# Patient Record
Sex: Female | Born: 1950 | Race: White | Hispanic: No | Marital: Married | Smoking: Never smoker
Health system: Southern US, Community
[De-identification: ages and names within clinical notes are randomized; demographics above are authoritative.]

## PROBLEM LIST (undated history)

## (undated) DIAGNOSIS — K7689 Other specified diseases of liver: Secondary | ICD-10-CM

## (undated) DIAGNOSIS — C801 Malignant (primary) neoplasm, unspecified: Secondary | ICD-10-CM

## (undated) DIAGNOSIS — R7309 Other abnormal glucose: Secondary | ICD-10-CM

## (undated) DIAGNOSIS — E039 Hypothyroidism, unspecified: Secondary | ICD-10-CM

## (undated) DIAGNOSIS — E669 Obesity, unspecified: Secondary | ICD-10-CM

## (undated) DIAGNOSIS — G4730 Sleep apnea, unspecified: Secondary | ICD-10-CM

## (undated) DIAGNOSIS — I029 Rheumatic chorea without heart involvement: Secondary | ICD-10-CM

## (undated) DIAGNOSIS — E785 Hyperlipidemia, unspecified: Secondary | ICD-10-CM

## (undated) DIAGNOSIS — T783XXA Angioneurotic edema, initial encounter: Secondary | ICD-10-CM

## (undated) DIAGNOSIS — Z8711 Personal history of peptic ulcer disease: Secondary | ICD-10-CM

## (undated) DIAGNOSIS — T7840XA Allergy, unspecified, initial encounter: Secondary | ICD-10-CM

## (undated) DIAGNOSIS — G43009 Migraine without aura, not intractable, without status migrainosus: Secondary | ICD-10-CM

## (undated) DIAGNOSIS — I495 Sick sinus syndrome: Secondary | ICD-10-CM

## (undated) DIAGNOSIS — E119 Type 2 diabetes mellitus without complications: Secondary | ICD-10-CM

## (undated) DIAGNOSIS — Z923 Personal history of irradiation: Secondary | ICD-10-CM

## (undated) DIAGNOSIS — Z973 Presence of spectacles and contact lenses: Secondary | ICD-10-CM

## (undated) DIAGNOSIS — M1990 Unspecified osteoarthritis, unspecified site: Secondary | ICD-10-CM

## (undated) DIAGNOSIS — I10 Essential (primary) hypertension: Secondary | ICD-10-CM

## (undated) DIAGNOSIS — M549 Dorsalgia, unspecified: Secondary | ICD-10-CM

## (undated) DIAGNOSIS — Z972 Presence of dental prosthetic device (complete) (partial): Secondary | ICD-10-CM

## (undated) DIAGNOSIS — K08109 Complete loss of teeth, unspecified cause, unspecified class: Secondary | ICD-10-CM

## (undated) HISTORY — DX: Type 2 diabetes mellitus without complications: E11.9

## (undated) HISTORY — DX: Malignant (primary) neoplasm, unspecified: C80.1

## (undated) HISTORY — DX: Sick sinus syndrome: I49.5

## (undated) HISTORY — DX: Hypothyroidism, unspecified: E03.9

## (undated) HISTORY — DX: Essential (primary) hypertension: I10

## (undated) HISTORY — DX: Obesity, unspecified: E66.9

## (undated) HISTORY — DX: Personal history of peptic ulcer disease: Z87.11

## (undated) HISTORY — DX: Rheumatic chorea without heart involvement: I02.9

## (undated) HISTORY — DX: Other specified diseases of liver: K76.89

## (undated) HISTORY — DX: Dorsalgia, unspecified: M54.9

## (undated) HISTORY — DX: Allergy, unspecified, initial encounter: T78.40XA

## (undated) HISTORY — PX: BREAST BIOPSY: SHX20

## (undated) HISTORY — DX: Migraine without aura, not intractable, without status migrainosus: G43.009

## (undated) HISTORY — DX: Sleep apnea, unspecified: G47.30

## (undated) HISTORY — DX: Unspecified osteoarthritis, unspecified site: M19.90

## (undated) HISTORY — DX: Angioneurotic edema, initial encounter: T78.3XXA

## (undated) HISTORY — DX: Hyperlipidemia, unspecified: E78.5

## (undated) HISTORY — PX: OTHER SURGICAL HISTORY: SHX169

## (undated) HISTORY — DX: Other abnormal glucose: R73.09

## (undated) HISTORY — PX: LAPAROSCOPY ABDOMEN DIAGNOSTIC: PRO50

## (undated) HISTORY — PX: TONSILLECTOMY: SUR1361

---

## 1969-05-05 HISTORY — PX: TUBAL LIGATION: SHX77

## 1986-05-05 HISTORY — PX: ABDOMINAL HYSTERECTOMY: SHX81

## 1999-06-04 ENCOUNTER — Encounter: Payer: Self-pay | Admitting: Internal Medicine

## 1999-06-04 ENCOUNTER — Encounter: Admission: RE | Admit: 1999-06-04 | Discharge: 1999-06-04 | Payer: Self-pay | Admitting: Internal Medicine

## 2000-06-03 ENCOUNTER — Encounter: Admission: RE | Admit: 2000-06-03 | Discharge: 2000-06-03 | Payer: Self-pay | Admitting: Internal Medicine

## 2000-06-03 ENCOUNTER — Encounter: Payer: Self-pay | Admitting: Internal Medicine

## 2001-08-06 ENCOUNTER — Encounter: Admission: RE | Admit: 2001-08-06 | Discharge: 2001-08-06 | Payer: Self-pay | Admitting: Family Medicine

## 2001-08-06 ENCOUNTER — Encounter: Payer: Self-pay | Admitting: Family Medicine

## 2002-05-24 ENCOUNTER — Encounter: Admission: RE | Admit: 2002-05-24 | Discharge: 2002-05-24 | Payer: Self-pay | Admitting: Internal Medicine

## 2002-05-24 ENCOUNTER — Encounter: Payer: Self-pay | Admitting: Internal Medicine

## 2003-06-05 ENCOUNTER — Encounter: Admission: RE | Admit: 2003-06-05 | Discharge: 2003-06-05 | Payer: Self-pay | Admitting: Obstetrics and Gynecology

## 2004-04-05 ENCOUNTER — Ambulatory Visit: Payer: Self-pay | Admitting: Internal Medicine

## 2004-04-05 ENCOUNTER — Observation Stay (HOSPITAL_COMMUNITY): Admission: EM | Admit: 2004-04-05 | Discharge: 2004-04-06 | Payer: Self-pay | Admitting: Emergency Medicine

## 2004-04-05 ENCOUNTER — Ambulatory Visit: Payer: Self-pay | Admitting: Cardiology

## 2004-04-10 ENCOUNTER — Ambulatory Visit: Payer: Self-pay

## 2004-04-10 ENCOUNTER — Ambulatory Visit: Payer: Self-pay | Admitting: Cardiology

## 2004-07-09 ENCOUNTER — Encounter: Admission: RE | Admit: 2004-07-09 | Discharge: 2004-07-09 | Payer: Self-pay | Admitting: Obstetrics and Gynecology

## 2004-09-19 ENCOUNTER — Ambulatory Visit: Payer: Self-pay | Admitting: Internal Medicine

## 2004-12-16 ENCOUNTER — Ambulatory Visit: Payer: Self-pay | Admitting: Internal Medicine

## 2005-01-24 ENCOUNTER — Other Ambulatory Visit: Admission: RE | Admit: 2005-01-24 | Discharge: 2005-01-24 | Payer: Self-pay | Admitting: Obstetrics and Gynecology

## 2005-08-13 ENCOUNTER — Encounter: Admission: RE | Admit: 2005-08-13 | Discharge: 2005-08-13 | Payer: Self-pay | Admitting: Internal Medicine

## 2005-12-08 ENCOUNTER — Ambulatory Visit: Payer: Self-pay | Admitting: Internal Medicine

## 2006-03-06 ENCOUNTER — Ambulatory Visit: Payer: Self-pay | Admitting: Internal Medicine

## 2006-03-06 LAB — CONVERTED CEMR LAB
AST: 21 units/L (ref 0–37)
Albumin: 3.7 g/dL (ref 3.5–5.2)
Alkaline Phosphatase: 60 units/L (ref 39–117)
BUN: 13 mg/dL (ref 6–23)
Basophils Absolute: 0.1 10*3/uL (ref 0.0–0.1)
Chloride: 105 meq/L (ref 96–112)
Chol/HDL Ratio, serum: 6.3
Eosinophil percent: 4.2 % (ref 0.0–5.0)
Glucose, Bld: 92 mg/dL (ref 70–99)
HDL: 30.4 mg/dL — ABNORMAL LOW (ref >39.0)
Hemoglobin: 14.5 g/dL (ref 12.0–15.0)
Leukocytes, UA: NEGATIVE
Neutro Abs: 4.9 10*3/uL (ref 1.4–7.7)
RBC: 4.86 M/uL (ref 3.87–5.11)
RDW: 11.9 % (ref 11.5–14.6)
TSH: 0.1 microintl units/mL — ABNORMAL LOW (ref 0.35–5.50)
Total Bilirubin: 0.7 mg/dL (ref 0.3–1.2)
VLDL: 38 mg/dL (ref 0–40)
pH: 5 (ref 5.0–8.0)

## 2006-03-10 ENCOUNTER — Ambulatory Visit: Payer: Self-pay | Admitting: Internal Medicine

## 2006-05-13 ENCOUNTER — Encounter: Admission: RE | Admit: 2006-05-13 | Discharge: 2006-05-13 | Payer: Self-pay | Admitting: Internal Medicine

## 2006-06-08 ENCOUNTER — Ambulatory Visit: Payer: Self-pay | Admitting: Internal Medicine

## 2006-07-13 ENCOUNTER — Ambulatory Visit: Payer: Self-pay | Admitting: Internal Medicine

## 2006-07-13 LAB — HM COLONOSCOPY

## 2006-08-19 ENCOUNTER — Ambulatory Visit: Payer: Self-pay | Admitting: Internal Medicine

## 2006-11-23 ENCOUNTER — Ambulatory Visit: Payer: Self-pay | Admitting: Internal Medicine

## 2007-01-27 ENCOUNTER — Ambulatory Visit: Payer: Self-pay | Admitting: Internal Medicine

## 2007-01-27 ENCOUNTER — Encounter: Payer: Self-pay | Admitting: Internal Medicine

## 2007-01-27 DIAGNOSIS — Z8669 Personal history of other diseases of the nervous system and sense organs: Secondary | ICD-10-CM

## 2007-01-28 DIAGNOSIS — E039 Hypothyroidism, unspecified: Secondary | ICD-10-CM

## 2007-01-28 DIAGNOSIS — T783XXA Angioneurotic edema, initial encounter: Secondary | ICD-10-CM | POA: Insufficient documentation

## 2007-01-28 DIAGNOSIS — R7309 Other abnormal glucose: Secondary | ICD-10-CM

## 2007-01-28 DIAGNOSIS — E781 Pure hyperglyceridemia: Secondary | ICD-10-CM

## 2007-01-28 DIAGNOSIS — G43009 Migraine without aura, not intractable, without status migrainosus: Secondary | ICD-10-CM | POA: Insufficient documentation

## 2007-01-28 DIAGNOSIS — K7689 Other specified diseases of liver: Secondary | ICD-10-CM | POA: Insufficient documentation

## 2007-01-28 DIAGNOSIS — I10 Essential (primary) hypertension: Secondary | ICD-10-CM

## 2007-01-28 DIAGNOSIS — E663 Overweight: Secondary | ICD-10-CM | POA: Insufficient documentation

## 2007-01-28 DIAGNOSIS — K760 Fatty (change of) liver, not elsewhere classified: Secondary | ICD-10-CM | POA: Insufficient documentation

## 2007-05-10 ENCOUNTER — Encounter: Payer: Self-pay | Admitting: *Deleted

## 2007-05-10 DIAGNOSIS — M549 Dorsalgia, unspecified: Secondary | ICD-10-CM | POA: Insufficient documentation

## 2007-05-10 DIAGNOSIS — Z9089 Acquired absence of other organs: Secondary | ICD-10-CM

## 2007-05-10 DIAGNOSIS — Z9079 Acquired absence of other genital organ(s): Secondary | ICD-10-CM | POA: Insufficient documentation

## 2007-05-10 DIAGNOSIS — Z8711 Personal history of peptic ulcer disease: Secondary | ICD-10-CM

## 2007-05-10 DIAGNOSIS — I029 Rheumatic chorea without heart involvement: Secondary | ICD-10-CM | POA: Insufficient documentation

## 2007-07-07 ENCOUNTER — Encounter: Admission: RE | Admit: 2007-07-07 | Discharge: 2007-07-07 | Payer: Self-pay | Admitting: Allergy and Immunology

## 2007-07-28 ENCOUNTER — Ambulatory Visit: Payer: Self-pay | Admitting: Hematology & Oncology

## 2007-08-18 ENCOUNTER — Encounter: Payer: Self-pay | Admitting: Internal Medicine

## 2007-08-18 LAB — CBC & DIFF AND RETIC
BASO%: 0.5 % (ref 0.0–2.0)
Eosinophils Absolute: 0.2 10*3/uL (ref 0.0–0.5)
HCT: 43.3 % (ref 34.8–46.6)
IRF: 0.36 — ABNORMAL HIGH (ref 0.130–0.330)
LYMPH%: 27.9 % (ref 14.0–48.0)
MCHC: 35.4 g/dL (ref 32.0–36.0)
MONO#: 0.5 10*3/uL (ref 0.1–0.9)
NEUT#: 5.9 10*3/uL (ref 1.5–6.5)
Platelets: 388 10*3/uL (ref 145–400)
RBC: 4.92 10*6/uL (ref 3.70–5.32)
Retic %: 1.9 % (ref 0.4–2.3)
WBC: 9.1 10*3/uL (ref 3.9–10.0)
lymph#: 2.5 10*3/uL (ref 0.9–3.3)

## 2007-08-18 LAB — CHCC SMEAR

## 2007-08-24 LAB — COMPREHENSIVE METABOLIC PANEL
ALT: 17 U/L (ref 0–35)
AST: 15 U/L (ref 0–37)
Albumin: 4 g/dL (ref 3.5–5.2)
Alkaline Phosphatase: 60 U/L (ref 39–117)
Potassium: 4.1 mEq/L (ref 3.5–5.3)
Sodium: 138 mEq/L (ref 135–145)
Total Bilirubin: 0.6 mg/dL (ref 0.3–1.2)
Total Protein: 7.2 g/dL (ref 6.0–8.3)

## 2007-08-24 LAB — HEMOGLOBINOPATHY EVALUATION
Hemoglobin Other: 0 % (ref 0.0–0.0)
Hgb A2 Quant: 2.4 % (ref 2.2–3.2)
Hgb A: 97.6 % (ref 96.8–97.8)
Hgb F Quant: 0 % (ref 0.0–2.0)
Hgb S Quant: 0 % (ref 0.0–0.0)

## 2007-08-24 LAB — VITAMIN B12: Vitamin B-12: 414 pg/mL (ref 211–911)

## 2007-10-18 ENCOUNTER — Ambulatory Visit: Payer: Self-pay | Admitting: Hematology & Oncology

## 2007-10-20 ENCOUNTER — Encounter: Payer: Self-pay | Admitting: Internal Medicine

## 2007-10-20 LAB — CBC WITH DIFFERENTIAL/PLATELET
BASO%: 0.5 % (ref 0.0–2.0)
Basophils Absolute: 0 10*3/uL (ref 0.0–0.1)
EOS%: 2.6 % (ref 0.0–7.0)
HCT: 42.9 % (ref 34.8–46.6)
HGB: 14.9 g/dL (ref 11.6–15.9)
MONO#: 0.6 10*3/uL (ref 0.1–0.9)
NEUT%: 60.2 % (ref 39.6–76.8)
RDW: 13 % (ref 11.3–14.5)
WBC: 8.9 10*3/uL (ref 3.9–10.0)
lymph#: 2.6 10*3/uL (ref 0.9–3.3)

## 2008-03-10 ENCOUNTER — Encounter: Admission: RE | Admit: 2008-03-10 | Discharge: 2008-03-10 | Payer: Self-pay | Admitting: Obstetrics and Gynecology

## 2008-06-14 ENCOUNTER — Ambulatory Visit: Payer: Self-pay | Admitting: Internal Medicine

## 2008-09-15 ENCOUNTER — Encounter: Payer: Self-pay | Admitting: Internal Medicine

## 2008-12-01 ENCOUNTER — Telehealth: Payer: Self-pay | Admitting: Internal Medicine

## 2009-04-12 ENCOUNTER — Encounter: Admission: RE | Admit: 2009-04-12 | Discharge: 2009-04-12 | Payer: Self-pay | Admitting: Obstetrics and Gynecology

## 2009-04-12 ENCOUNTER — Ambulatory Visit: Payer: Self-pay | Admitting: Internal Medicine

## 2009-04-12 DIAGNOSIS — R252 Cramp and spasm: Secondary | ICD-10-CM

## 2009-04-12 LAB — CONVERTED CEMR LAB
ALT: 21 units/L (ref 0–35)
AST: 23 units/L (ref 0–37)
Albumin: 3.9 g/dL (ref 3.5–5.2)
Alkaline Phosphatase: 63 units/L (ref 39–117)
Basophils Absolute: 0.1 10*3/uL (ref 0.0–0.1)
Bilirubin, Direct: 0.1 mg/dL (ref 0.0–0.3)
Eosinophils Absolute: 0.3 10*3/uL (ref 0.0–0.7)
Eosinophils Relative: 3.5 % (ref 0.0–5.0)
GFR calc non Af Amer: 77.71 mL/min (ref >60)
HCT: 43 % (ref 36.0–46.0)
Hemoglobin: 14.8 g/dL (ref 12.0–15.0)
Lymphocytes Relative: 29.8 % (ref 12.0–46.0)
Lymphs Abs: 2.6 10*3/uL (ref 0.7–4.0)
MCHC: 34.4 g/dL (ref 30.0–36.0)
MCV: 92.2 fL (ref 78.0–100.0)
Neutro Abs: 5.2 10*3/uL (ref 1.4–7.7)
Neutrophils Relative %: 61.1 % (ref 43.0–77.0)
Platelets: 284 10*3/uL (ref 150.0–400.0)
RBC: 4.67 M/uL (ref 3.87–5.11)
RDW: 11.9 % (ref 11.5–14.6)
WBC: 8.6 10*3/uL (ref 4.5–10.5)

## 2009-04-18 ENCOUNTER — Encounter: Admission: RE | Admit: 2009-04-18 | Discharge: 2009-04-18 | Payer: Self-pay | Admitting: Obstetrics and Gynecology

## 2009-04-24 ENCOUNTER — Encounter: Admission: RE | Admit: 2009-04-24 | Discharge: 2009-04-24 | Payer: Self-pay | Admitting: Obstetrics and Gynecology

## 2009-05-01 ENCOUNTER — Encounter: Admission: RE | Admit: 2009-05-01 | Discharge: 2009-05-01 | Payer: Self-pay | Admitting: Obstetrics and Gynecology

## 2009-05-05 HISTORY — PX: BREAST LUMPECTOMY: SHX2

## 2009-05-08 ENCOUNTER — Encounter: Admission: RE | Admit: 2009-05-08 | Discharge: 2009-05-08 | Payer: Self-pay | Admitting: Obstetrics and Gynecology

## 2009-05-14 ENCOUNTER — Encounter: Payer: Self-pay | Admitting: Internal Medicine

## 2009-05-22 ENCOUNTER — Encounter: Admission: RE | Admit: 2009-05-22 | Discharge: 2009-05-22 | Payer: Self-pay | Admitting: Obstetrics and Gynecology

## 2009-05-31 ENCOUNTER — Encounter: Admission: RE | Admit: 2009-05-31 | Discharge: 2009-05-31 | Payer: Self-pay | Admitting: Surgery

## 2009-06-05 ENCOUNTER — Ambulatory Visit (HOSPITAL_BASED_OUTPATIENT_CLINIC_OR_DEPARTMENT_OTHER): Admission: RE | Admit: 2009-06-05 | Discharge: 2009-06-05 | Payer: Self-pay | Admitting: Surgery

## 2009-06-05 ENCOUNTER — Encounter: Admission: RE | Admit: 2009-06-05 | Discharge: 2009-06-05 | Payer: Self-pay | Admitting: Surgery

## 2009-06-18 ENCOUNTER — Ambulatory Visit: Payer: Self-pay | Admitting: Hematology & Oncology

## 2009-06-20 ENCOUNTER — Ambulatory Visit: Payer: Self-pay | Admitting: Oncology

## 2009-06-20 ENCOUNTER — Encounter: Payer: Self-pay | Admitting: Internal Medicine

## 2009-06-20 LAB — CBC WITH DIFFERENTIAL/PLATELET
BASO%: 0.2 % (ref 0.0–2.0)
EOS%: 2.2 % (ref 0.0–7.0)
MCH: 31.2 pg (ref 25.1–34.0)
MCHC: 34.5 g/dL (ref 31.5–36.0)
MONO#: 0.7 10*3/uL (ref 0.1–0.9)
NEUT%: 60.1 % (ref 38.4–76.8)
RBC: 5 10*6/uL (ref 3.70–5.45)
RDW: 12.6 % (ref 11.2–14.5)
WBC: 9.3 10*3/uL (ref 3.9–10.3)
lymph#: 2.8 10*3/uL (ref 0.9–3.3)

## 2009-06-20 LAB — COMPREHENSIVE METABOLIC PANEL
ALT: 19 U/L (ref 0–35)
AST: 16 U/L (ref 0–37)
CO2: 23 mEq/L (ref 19–32)
Calcium: 9.6 mg/dL (ref 8.4–10.5)
Chloride: 103 mEq/L (ref 96–112)
Creatinine, Ser: 0.84 mg/dL (ref 0.40–1.20)
Sodium: 138 mEq/L (ref 135–145)
Total Protein: 7.5 g/dL (ref 6.0–8.3)

## 2009-06-20 LAB — CANCER ANTIGEN 27.29: CA 27.29: 33 U/mL (ref 0–39)

## 2009-06-26 ENCOUNTER — Ambulatory Visit: Admission: RE | Admit: 2009-06-26 | Discharge: 2009-08-27 | Payer: Self-pay | Admitting: Radiation Oncology

## 2009-06-27 ENCOUNTER — Encounter: Payer: Self-pay | Admitting: Internal Medicine

## 2009-07-04 ENCOUNTER — Ambulatory Visit: Payer: Self-pay | Admitting: Internal Medicine

## 2009-07-04 ENCOUNTER — Encounter: Payer: Self-pay | Admitting: Internal Medicine

## 2009-08-10 ENCOUNTER — Ambulatory Visit: Payer: Self-pay | Admitting: Oncology

## 2009-08-14 ENCOUNTER — Encounter: Payer: Self-pay | Admitting: Internal Medicine

## 2009-08-14 LAB — CBC WITH DIFFERENTIAL/PLATELET
BASO%: 0.4 % (ref 0.0–2.0)
EOS%: 3.9 % (ref 0.0–7.0)
Eosinophils Absolute: 0.3 10*3/uL (ref 0.0–0.5)
LYMPH%: 24 % (ref 14.0–49.7)
MCH: 30.7 pg (ref 25.1–34.0)
MCHC: 33.7 g/dL (ref 31.5–36.0)
MCV: 91.2 fL (ref 79.5–101.0)
MONO%: 8.6 % (ref 0.0–14.0)
NEUT#: 4.6 10*3/uL (ref 1.5–6.5)
Platelets: 261 10*3/uL (ref 145–400)
RBC: 5.01 10*6/uL (ref 3.70–5.45)
RDW: 13.1 % (ref 11.2–14.5)
nRBC: 0 % (ref 0–0)

## 2009-08-19 ENCOUNTER — Encounter: Payer: Self-pay | Admitting: Internal Medicine

## 2009-09-19 ENCOUNTER — Ambulatory Visit: Payer: Self-pay | Admitting: Oncology

## 2009-09-19 LAB — CBC WITH DIFFERENTIAL/PLATELET
Basophils Absolute: 0 10*3/uL (ref 0.0–0.1)
EOS%: 2.8 % (ref 0.0–7.0)
Eosinophils Absolute: 0.2 10*3/uL (ref 0.0–0.5)
HGB: 15 g/dL (ref 11.6–15.9)
NEUT#: 4.1 10*3/uL (ref 1.5–6.5)
RBC: 4.74 10*6/uL (ref 3.70–5.45)
RDW: 12.4 % (ref 11.2–14.5)
lymph#: 1.8 10*3/uL (ref 0.9–3.3)

## 2009-09-20 LAB — COMPREHENSIVE METABOLIC PANEL
AST: 18 U/L (ref 0–37)
Albumin: 4.1 g/dL (ref 3.5–5.2)
BUN: 11 mg/dL (ref 6–23)
Calcium: 9.3 mg/dL (ref 8.4–10.5)
Chloride: 105 mEq/L (ref 96–112)
Glucose, Bld: 208 mg/dL — ABNORMAL HIGH (ref 70–99)
Potassium: 4.1 mEq/L (ref 3.5–5.3)
Sodium: 138 mEq/L (ref 135–145)
Total Protein: 7.1 g/dL (ref 6.0–8.3)

## 2009-09-27 ENCOUNTER — Encounter: Payer: Self-pay | Admitting: Internal Medicine

## 2009-10-02 ENCOUNTER — Telehealth: Payer: Self-pay | Admitting: Internal Medicine

## 2009-10-02 ENCOUNTER — Ambulatory Visit: Payer: Self-pay | Admitting: Internal Medicine

## 2009-10-02 LAB — CONVERTED CEMR LAB: Hgb A1c MFr Bld: 6.2 % (ref 4.6–6.5)

## 2009-12-19 ENCOUNTER — Encounter: Payer: Self-pay | Admitting: Internal Medicine

## 2010-01-17 ENCOUNTER — Ambulatory Visit: Payer: Self-pay | Admitting: Oncology

## 2010-01-22 LAB — CBC WITH DIFFERENTIAL/PLATELET
BASO%: 0.5 % (ref 0.0–2.0)
EOS%: 1.8 % (ref 0.0–7.0)
Eosinophils Absolute: 0.1 10*3/uL (ref 0.0–0.5)
HGB: 14.6 g/dL (ref 11.6–15.9)
LYMPH%: 26.4 % (ref 14.0–49.7)
MCH: 31 pg (ref 25.1–34.0)
MCHC: 34.6 g/dL (ref 31.5–36.0)
MCV: 89.5 fL (ref 79.5–101.0)
NEUT#: 5.3 10*3/uL (ref 1.5–6.5)
Platelets: 268 10*3/uL (ref 145–400)
RBC: 4.72 10*6/uL (ref 3.70–5.45)
WBC: 8.4 10*3/uL (ref 3.9–10.3)

## 2010-01-22 LAB — COMPREHENSIVE METABOLIC PANEL
Alkaline Phosphatase: 58 U/L (ref 39–117)
BUN: 15 mg/dL (ref 6–23)
CO2: 28 mEq/L (ref 19–32)
Chloride: 105 mEq/L (ref 96–112)
Potassium: 4.7 mEq/L (ref 3.5–5.3)
Sodium: 141 mEq/L (ref 135–145)
Total Bilirubin: 0.4 mg/dL (ref 0.3–1.2)
Total Protein: 7.3 g/dL (ref 6.0–8.3)

## 2010-01-22 LAB — VITAMIN D 25 HYDROXY (VIT D DEFICIENCY, FRACTURES): Vit D, 25-Hydroxy: 43 ng/mL (ref 30–89)

## 2010-01-28 ENCOUNTER — Encounter: Payer: Self-pay | Admitting: Internal Medicine

## 2010-04-12 ENCOUNTER — Ambulatory Visit: Payer: Self-pay | Admitting: Oncology

## 2010-04-15 ENCOUNTER — Encounter
Admission: RE | Admit: 2010-04-15 | Discharge: 2010-04-15 | Payer: Self-pay | Source: Home / Self Care | Attending: Surgery | Admitting: Surgery

## 2010-04-15 LAB — HM MAMMOGRAPHY

## 2010-04-30 ENCOUNTER — Encounter: Payer: Self-pay | Admitting: Internal Medicine

## 2010-05-26 ENCOUNTER — Encounter: Payer: Self-pay | Admitting: Obstetrics and Gynecology

## 2010-06-04 NOTE — Progress Notes (Signed)
  Phone Note Call from Patient   Summary of Call: Per pt, Dr Darnelle Catalan was concerned about elevated cbg, 200, from last labs. Dr Darnelle Catalan advised pt to get labs from Dr Debby Bud office. She wanted to know if we had any information regarding this. left mess to call office back  Initial call taken by: Lamar Sprinkles, CMA,  Oct 02, 2009 9:34 AM  Follow-up for Phone Call        last serum glucose Dec '10 was 126. Patient needs to come in for A1C with OV to follow. 790.6 Follow-up by: Jacques Navy MD,  Oct 02, 2009 10:32 AM  Additional Follow-up for Phone Call Additional follow up Details #1::        pt informed and transferred to [facility] to set up appt Additional Follow-up by: Ami Bullins CMA,  Oct 02, 2009 10:46 AM

## 2010-06-04 NOTE — Letter (Signed)
[facility]   Imported By: Lester Karlstad 07/20/2009 07:41:15  _____________________________________________________________________  External Attachment:    Type:   Image     Comment:   External Document

## 2010-06-04 NOTE — Letter (Signed)
   [facility] [address] [place], [place]  [address] Phone: [phone]      August 22, 2009   Phylicia Banas [address] [place], [place] [address]  RE:  LAB RESULTS  Dear  Ms. Goldsmith,  The following is an interpretation of your most recent lab tests.  Please take note of any instructions provided or changes to medications that have resulted from your lab work.      Your bone density study revealed normal bone.  Plan is to continue to do weight bearing exercise like walking and to take 1200mg  daily of calcium with [phone] international units of Vitamin D.   Sincerely Yours,    Jacques Navy MD

## 2010-06-04 NOTE — Letter (Signed)
[facility]   Imported By: Lennie Odor 08/27/2009 17:16:52  _____________________________________________________________________  External Attachment:    Type:   Image     Comment:   External Document

## 2010-06-04 NOTE — Letter (Signed)
[facility]   Imported By: Sherian Rein 10/16/2009 09:50:24  _____________________________________________________________________  External Attachment:    Type:   Image     Comment:   External Document

## 2010-06-04 NOTE — Letter (Signed)
Summary: [facility]   Imported By: Sherian Rein 01/04/2010 09:09:22  _____________________________________________________________________  External Attachment:    Type:   Image     Comment:   External Document

## 2010-06-04 NOTE — Miscellaneous (Signed)
Summary: BONE DENSITY  Clinical Lists Changes  Orders: Added new Test order of T-Bone Densitometry (77080) - Signed Added new Test order of T-Lumbar Vertebral Assessment (77082) - Signed 

## 2010-06-04 NOTE — Letter (Signed)
Summary: [facility]   Imported By: Lester Seymour 05/29/2009 15:17:08  _____________________________________________________________________  External Attachment:    Type:   Image     Comment:   External Document

## 2010-06-04 NOTE — Letter (Signed)
Summary: [facility]  [facility]   Imported By: Sherian Rein 02/15/2010 08:43:53  _____________________________________________________________________  External Attachment:    Type:   Image     Comment:   External Document

## 2010-06-06 NOTE — Letter (Signed)
Summary: [facility]   Imported By: Sherian Rein 05/16/2010 14:08:05  _____________________________________________________________________  External Attachment:    Type:   Image     Comment:   External Document

## 2010-06-07 NOTE — Letter (Signed)
[facility]   Imported By: Lester Oasis 07/16/2009 08:25:53  _____________________________________________________________________  External Attachment:    Type:   Image     Comment:   External Document

## 2010-07-21 LAB — COMPREHENSIVE METABOLIC PANEL
ALT: 23 U/L (ref 0–35)
CO2: 23 mEq/L (ref 19–32)
Calcium: 9.6 mg/dL (ref 8.4–10.5)
Chloride: 105 mEq/L (ref 96–112)
GFR calc non Af Amer: >60 mL/min (ref >60)
Glucose, Bld: 90 mg/dL (ref 70–99)
Sodium: 135 mEq/L (ref 135–145)
Total Bilirubin: 0.4 mg/dL (ref 0.3–1.2)

## 2010-07-21 LAB — CBC
HCT: 45.8 % (ref 36.0–46.0)
Hemoglobin: 16 g/dL — ABNORMAL HIGH (ref 12.0–15.0)
MCHC: 35 g/dL (ref 30.0–36.0)
Platelets: 319 10*3/uL (ref 150–400)
RDW: 12.3 % (ref 11.5–15.5)

## 2010-07-21 LAB — DIFFERENTIAL
Basophils Absolute: 0 10*3/uL (ref 0.0–0.1)
Basophils Relative: 0 % (ref 0–1)
Eosinophils Absolute: 0.2 10*3/uL (ref 0.0–0.7)
Lymphs Abs: 2.8 10*3/uL (ref 0.7–4.0)
Neutrophils Relative %: 56 % (ref 43–77)

## 2010-08-08 ENCOUNTER — Other Ambulatory Visit: Payer: Self-pay | Admitting: Internal Medicine

## 2010-08-08 ENCOUNTER — Ambulatory Visit (INDEPENDENT_AMBULATORY_CARE_PROVIDER_SITE_OTHER): Payer: 59 | Admitting: Internal Medicine

## 2010-08-08 ENCOUNTER — Encounter: Payer: Self-pay | Admitting: Internal Medicine

## 2010-08-08 ENCOUNTER — Other Ambulatory Visit (INDEPENDENT_AMBULATORY_CARE_PROVIDER_SITE_OTHER): Payer: 59

## 2010-08-08 VITALS — BP 138/80 | HR 81 | Temp 98.3°F | Wt 201.0 lb

## 2010-08-08 DIAGNOSIS — E039 Hypothyroidism, unspecified: Secondary | ICD-10-CM

## 2010-08-08 DIAGNOSIS — R7309 Other abnormal glucose: Secondary | ICD-10-CM

## 2010-08-08 DIAGNOSIS — I10 Essential (primary) hypertension: Secondary | ICD-10-CM

## 2010-08-08 DIAGNOSIS — M549 Dorsalgia, unspecified: Secondary | ICD-10-CM

## 2010-08-08 DIAGNOSIS — Z09 Encounter for follow-up examination after completed treatment for conditions other than malignant neoplasm: Secondary | ICD-10-CM

## 2010-08-08 LAB — COMPREHENSIVE METABOLIC PANEL
BUN: 12 mg/dL (ref 6–23)
CO2: 28 mEq/L (ref 19–32)
Calcium: 9.2 mg/dL (ref 8.4–10.5)
Chloride: 103 mEq/L (ref 96–112)
Creatinine, Ser: 0.8 mg/dL (ref 0.4–1.2)
GFR: 75.19 mL/min (ref >60.00)
Glucose, Bld: 88 mg/dL (ref 70–99)
Total Bilirubin: 0.7 mg/dL (ref 0.3–1.2)

## 2010-08-08 NOTE — Progress Notes (Signed)
Subjective:    Patient ID: Carla Mccoy, female    DOB: [birth year], 60 y.o.   MRN: [id number]  HPI Patient presents for a 10 day h/o severe back pain, similar in pain to a kidney stone. She was taking 6000mg  APAP daily!! She reports that the pain is much better. There was some radiation down the leg, no loss of strength, no paresthesia.   As a secondary issue she is wondering if she needs to have lab work for monitoring her thyroid function.  She continues to see Dr. Darnelle Catalan - she is 1 year out from completion of treatment. She reports that she had 3 BB size lesions in the right breast that are being followed. She has been told they are probably calcifications.   Past Medical History  Diagnosis Date  . Tachy-brady syndrome   . Backache, unspecified   . Personal history of peptic ulcer disease   . 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 Other and unspecified hyperlipidemia   . Other abnormal glucose   . Obesity, unspecified   . Angioneurotic edema not elsewhere classified   . Other chronic nonalcoholic liver disease   . Personal history of other disorders of nervous system and sense organs   . Migraine without aura, without mention of intractable migraine without mention of status migrainosus   . Unspecified hypothyroidism   . Unspecified essential hypertension   . Cancer     breast cancer left - lumpectomy   Past Surgical History  Procedure Date  . Laparoscopy abdomen diagnostic   . Tonsillectomy   . Tubal ligation   . Abdominal hysterectomy   . Breast lumpectomy     Feb '11  . Breast biopsy     in the 80's - benign tissue   Family History  Problem Relation Age of Onset  . Peripheral vascular disease Mother   . Coronary artery disease Mother   . Hyperlipidemia Mother   . Heart disease Mother   . Diabetes Sister   . Hypothyroidism Sister   . Diabetes Brother   . Hyperlipidemia Brother   . Heart disease Brother    History   Social History  .  Marital Status: Married    Spouse Name: N/A    Number of Children: N/A  . Years of Education: 14   Occupational History  . Firefighter    Social History Main Topics  . Smoking status: Never Smoker   . Smokeless tobacco: Never Used  . Alcohol Use: No  . Drug Use: No  . Sexually Active:    Other Topics Concern  . Not on file   Social History Narrative   Married -'47  1 son -'76, 1 daughter-'70; 2 grandchildren.  Work - Patent examiner for [employer].  Marriage is in good health. She enjoys her work.Allergy MD- [place]        Review of Systems Review of Systems  Constitutional:  Negative for fever, chills, activity change and unexpected weight change.  HENT:  Negative for hearing loss, ear pain, congestion, neck stiffness and postnasal drip.   Eyes: Negative for pain, discharge and visual disturbance.  Respiratory: Negative for chest tightness and wheezing.   Cardiovascular: Negative for chest pain and palpitations.       [No decreased exercise tolerance Gastrointestinal: [No change in bowel habit. No bloating or gas. No reflux or indigestion Genitourinary: Negative for urgency, frequency, flank pain and difficulty urinating.  Musculoskeletal: Negative for myalgias, back pain, arthralgias and  gait problem.  Neurological: Negative for dizziness, tremors, weakness and headaches.  Hematological: Negative for adenopathy.  Psychiatric/Behavioral: Negative for behavioral problems and dysphoric mood.       Objective:   Physical Exam  Constitutional: She is oriented to person, place, and time. She appears well-developed and well-nourished. No distress.  HENT:  Head: Normocephalic and atraumatic.  Eyes: Conjunctivae and EOM are normal.  Neck: Neck supple.  Cardiovascular: Normal rate and regular rhythm.   Pulmonary/Chest: Effort normal.  Musculoskeletal: Normal range of motion.  Neurological: She is alert and oriented to person, place, and time.  Psychiatric: She has  a normal mood and affect. Her behavior is normal.          Assessment & Plan:  1. Back pain - resolved  2. Fatty liver - reviewed all sources for initial diagnosis: abdominal U/S in '03 was read out as revealing fatty infiltrate of the liver.  3. Thyroid disease - due for follow-up lab with recommendations to follow.   4. Hypertension - well controlled. Will check routine labs  Lab Results  Component Value Date                                           ALT 29 08/08/2010   AST 30 08/08/2010   NA 141 08/08/2010   K 4.2 08/08/2010   CL 103 08/08/2010   CREATININE 0.8 08/08/2010   BUN 12 08/08/2010   CO2 28 08/08/2010   TSH 4.76 08/08/2010   HGBA1C 6.1 08/08/2010   Addendum- thyroid and all other labs normal.

## 2010-08-10 ENCOUNTER — Encounter: Payer: Self-pay | Admitting: Internal Medicine

## 2010-08-12 ENCOUNTER — Encounter: Payer: Self-pay | Admitting: Internal Medicine

## 2010-09-06 ENCOUNTER — Ambulatory Visit
Admission: RE | Admit: 2010-09-06 | Discharge: 2010-09-06 | Disposition: A | Discharge status: Home / Self Care | Payer: 59 | Source: Ambulatory Visit | Attending: Internal Medicine | Admitting: Internal Medicine

## 2010-09-06 DIAGNOSIS — Z09 Encounter for follow-up examination after completed treatment for conditions other than malignant neoplasm: Secondary | ICD-10-CM

## 2010-09-13 ENCOUNTER — Encounter (INDEPENDENT_AMBULATORY_CARE_PROVIDER_SITE_OTHER): Payer: Self-pay | Admitting: Surgery

## 2010-09-20 NOTE — Discharge Summary (Signed)
NAME:  Carla Mccoy, Carla Mccoy               ACCOUNT NO.:  [id number]   MEDICAL RECORD NO.:  [id number]          PATIENT TYPE:  INP   LOCATION:  3733                         FACILITY:  [facility]   PHYSICIAN:  Maisie Fus C. Wall, M.D.   DATE OF BIRTH:  Sep 25, 1950   DATE OF ADMISSION:  04/05/2004  DATE OF DISCHARGE:                                 DISCHARGE SUMMARY   PROCEDURES:  1.  Cardiac catheterization.  2.  Coronary arteriogram.  3.  Left ventriculogram.   HOSPITAL COURSE:  Carla Mccoy is a 60 year old female with no known history  of coronary artery disease.  Her cardiac risk factors include a family  history, obesity, and age.  She was wakened by chest pain that was described  as both pleuritic and a pressure sensation.  She saw her primary care  physician, who sent her to the hospital for admission.  She was seen by  cardiology and evaluated.   Her cardiac enzymes were negative for an MI, but it was felt that  catheterization was indicated to define her anatomy.  This was performed on  April 05, 2004.   The cardiac catheterization shows normal coronaries. She had a  hypercontractile LV with an EF of 80%, but no wall motion abnormalities and  no AS or MR.  She had an intraventricular peak change, approximately 20  mmHg.  There was no evidence of aortic dissection.  She is to be discharged  home on a beta blocker and an echo is to be checked to rule out hypertrophic  cardiomyopathy.   Post-catheterization, she is without chest pain or shortness of breath.  Her  bed rest is pending completion, but if she does well after that, she is  tentatively considered stable for discharge with outpatient followup  arranged.   DISCHARGE DIAGNOSES:  1.  Chest pain, no significant coronary artery disease by catheterization.  2.  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  3.  History of hypothyroidism.  4.  Status post hysterectomy.  5.   Obesity.  6.  History of hypertension, felt secondary to Premarin.  7.  Allergy or intolerance to CODEINE.  8.  Family history of coronary artery disease.   DISCHARGE INSTRUCTIONS:  Her activity level is to include no driving or  strenuous activity for two days.  She is to keep to a low fat diet. She is  to call the office for problems with her cath site.  She is to get an  echocardiogram on December 7 at 2 p.m. and see Dr. Daleen Squibb at 4:30. She is to  follow up with Dr. Debby Bud as needed.   DISCHARGE MEDICATIONS:  1.  Synthroid 175 mcg daily.  2.  Vitamin B daily.  3.  Vivelle-Dot daily.  4.  Lopressor 25 mg p.o. b.i.d.  5.  Protonix 40 mg p.o. q. a.m.      Rhon   RB/MEDQ  D:  04/05/2004  T:  04/06/2004  Job:  [id number]   cc:   Rosalyn Gess. Norins, M.D. [facility]

## 2010-09-20 NOTE — Consult Note (Signed)
NAME:  Carla Mccoy, EDMUNDS               ACCOUNT NO.:  [id number]   MEDICAL RECORD NO.:  [id number]          PATIENT TYPE:  INP   LOCATION:  1831                         FACILITY:  [facility]   PHYSICIAN:  Rene Paci, M.D. LHCDATE OF BIRTH:  04-05-1951   DATE OF CONSULTATION:  DATE OF DISCHARGE:                                   CONSULTATION   REASON FOR CONSULTATION:  For chest pain.   HISTORY OF PRESENT ILLNESS:  Carla Mccoy is a 60 year old female with no  known history of coronary artery disease, who awoke at 4 o'clock this  morning with substernal chest pain.  She describes it as basically  pleuritic, as worsening with deep breathing; however, she also describes it  as an elephant/brick pressure on her chest.  She took two trips to [place] this week, each over a six-hour time period. She saw her primary  care physician today and was admitted.  The EKG shows minimal ST segment  elevation of 1 mm in the anterior leads.  Lower extremity venous Dopplers  were negative for DVT, and a D-dimer was negative.  The patient is on IV  heparin, IV nitroglycerin, and aspirin.  The patient states that her chest  pain is better with these medications.   ALLERGIES:  CODEINE.   MEDICATIONS:  1.  Vitamin E.  2.  Synthroid 175 mcg a day.  3.  Vivelle Dot.   PAST MEDICAL HISTORY:  1.  Hypothyroidism.  2.  Status post hysterectomy in 1988.  3.  History of elevated blood pressure on Premarin.  4.  She wears glasses.  5.  Obesity.   SOCIAL HISTORY:  She lives in [place] with her husband.  She is a Water engineer.  She has two children.  No tobacco, alcohol, or illicit drug use.   FAMILY HISTORY:  Mom died at age 17 of MI.  Dad died at age 35 of a gunshot  wound.  She has five siblings, one brother with coronary artery disease.   REVIEW OF SYSTEMS:  As above, otherwise negative.   PHYSICAL EXAMINATION:  VITAL SIGNS:  Temperature 97.3, pulse 84,  respirations 18, blood pressure 142/87.  O2  saturation is 97% on room air.  HEENT:  Pharynx normal.  Normal carotid upstroke.  No carotid or  supraclavicular bruits, JVD, or thyromegaly.  CHEST:  Clear to auscultation bilaterally.  No wheezing or rhonchi.  CARDIAC:  Regular rate and rhythm, no S1, rub or murmur.  ABDOMEN:  Good bowel sounds, nontender, nondistended.  No masses, no bruits.  LOWER EXTREMITIES:  No peripheral edema.  SKIN:  Warm and dry.  NEUROLOGIC:  Cranial nerves II-XII grossly intact.   Chest x-ray reveals vascular crowding with cardiac enlargement.  EKG reveals  normal sinus rhythm, rate 67, with minimal ST segment elevation of 1 mm in  the anterior leads.   Lab studies were essentially normal, including CBC, CMET, CK-MB, troponin,  and anticoagulation studies.   ASSESSMENT AND PLAN:  1.  Chest pain.  2.  Hypothyroidism.  3.  Postmenopausal.  4.  Obesity.   The  patient is being taken to the cardiac catheterization emergently.      Larita Fife   LB/MEDQ  D:  04/05/2004  T:  04/06/2004  Job:  [id number]

## 2010-09-20 NOTE — Assessment & Plan Note (Signed)
[facility]                             PRIMARY CARE OFFICE NOTE   NAME:Mccoy, Carla MEIXNER                      MRN:          [id number]  DATE:03/10/2006                            DOB:          1951-01-31    Carla Mccoy is a 60 year old woman well known to me who presents today for  annual physical exam.   INTERVAL HISTORY:  The patient was last seen in the office December 08, 2005,  for pain in both hips and low back, as well as probable stress related  tachycardia.  She also was followed for essential tremor.  The patient had  been recommended to take metoprolol which she reports she never started  medication.  She also has had continued hip and left leg pain.   PAST MEDICAL HISTORY:  Surgical:  1. Tonsillectomy at age 54.  2. __________.  3. Laparoscopy x2 in 1986 secondary to abdominal pain.  4. Lumpectomy of the left axillary breast region in 1986 with a benign      tumor.  5. Hysterectomy in 1987 secondary to fibroid tumors with menorrhagia and      pain.   Medical history:  1. Usual childhood disease.  2. Sydenham's chorea at age 89.  3. History of peptic ulcer disease with episodes of epigastric pain      treated with H2 therapy.  4. Hypothyroid disease versus goiter, treated medically.  5. Chronic back pain with CT myelogram revealing L4-5 disc with root      impingement on the right side causing sciatica.  6. Carpal tunnel syndrome.  7. Migraine headaches.  8. History of elevated blood pressure.  9. Tachybrady syndrome.   CURRENT MEDICATIONS:  1. Synthroid 175 mcg daily.  2. Vivelle Dot patch.  3. Multivitamins.  4. Vitamin E.   HABITS:  Tobacco:  None.  Alcohol:  None.   DRUG ALLERGY:  1. CODEINE causes disorientation.  2. SULFA caused reaction as a child.   PHYSICIAN ROSTER:  1. Dr. Aram Beecham Romine for GYN.  2. Dr. Daleen Squibb for cardiology.  3. Dr. Sandria Manly for neurology.  4. Dr. Jarold Motto for gastroenterology.   FAMILY  HISTORY:  Father was murdered in a robbery at age 36.  Mother died in  her late 15s secondary to severe peripheral vascular disease and multiple  medical complications.  The patient has three brothers and two sisters in  relatively good health.  History is positive for diabetes in a maternal  grandmother, arthritis in a paternal grandmother, and sarcoma also in  maternal grandmother.   SOCIAL HISTORY:  The patient is married just at 38 years.  She has one grown  son and one grown daughter.  Does have grandchildren.  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   CHART REVIEW:  The patient underwent a cardiac catheterization December 2005  which showed normal coronary arteries, hypercontractile left ventricle,  question of hypertropic cardiomyopathy.   The patient had a flexible sigmoidoscopy in 1991.  The patient has been  seen  and evaluated by neurology, most recent visit August 2005 for essential  tremor and question of chorea Sydenham or chorea gravidarum, possibly  stimulated by hormone therapy.   Last 2-D echocardiogram April 10, 2004, with an EF of 65% and read out as  a normal study with no evidence of a cardiomyopathy.   Last mammogram August 13, 2005, that was unremarkable.   Abdominal ultrasound August 06, 2001, which revealed stable fatty infiltration  of the liver.   Last 12-lead electrocardiogram December 16, 2004, which shows sinus rhythm  with no specific abnormalities noted.   REVIEW OF SYSTEMS:  The patient has had sleep-duration insomnia which is  stress related.  She has had an eye exam in the last 12 months.  The patient  has full dentures, having lost her teeth secondary to pyorrhea.  The patient  has had chronic chest wall pain but she has had no other cardiac suggestive  pain or palpitations.  The patient does have a nonproductive cough which she  attributes to either allergy or reflux.  No GI,  GU, or dermatologic  complaints.   EXAMINATION:  VITAL SIGNS:  Temperature was 97, blood pressure 143/87, pulse  86, weight 214.  GENERAL APPEARANCE:  This is a heavyset Caucasian woman in no acute  distress.  HEENT:  Normocephalic, atraumatic.  EACs and TMs were unremarkable.  Oropharynx with full dentures.  Posterior pharynx was clear.  Conjunctivae  and sclerae were clear.  Pupils equal, round, and reactive to light and  accommodation.  Funduscopic exam with hand-held instrument revealed normal  disc margins and no vascular abnormality.  NECK:  Supple without thyromegaly.  NODES:  No lymphadenopathy was noted in the cervical, supraclavicular  regions.  CHEST:  No CVA tenderness.  LUNGS:  Clear to auscultation and percussion.  BREAST:  Exam deferred to gynecology.  CARDIOVASCULAR:  Shows 2+ radial pulse, no JVD, no carotid bruits.  She had  a regular rate and rhythm without murmurs, rubs or gallops.  ABDOMEN:  Soft, no guarding, no rebound.  No organosplenomegaly was noted.  PELVIC AND RECTAL:  Exams deferred to gynecology.  EXTREMITIES:  Without clubbing, cyanosis, edema, no deformities were noted.   DATA BASE:  Hemoglobin was 14.5 g, white count was 8600 with a normal  differential.  Cholesterol was 193, triglycerides 190, HDL was 30.4, LDL was  125.  Chemistries were normal with a blood sugar of 92.  Kidney function  normal with a creatinine of 0.9 and a GFR of 69.  Thyroid function  suppressed with a TSH of 0.10.  Urinalysis was negative.   ASSESSMENT AND PLAN:  1. Hypothyroid disease.  The patient is possibly slightly over corrected.      She is asymptomatic at this time.  Will have her continue on her      present medications without change.  2. Cardiovascular.  The patient with significant problems in the past with      tachycardia.  Her heart rate is moderately elevated at today's visit.      Blood pressure is mildly elevated at 143/87.  Plan:  The patient      encouraged to take metoprolol as prescribed.  If she decides to start      medications, seeing her back for followup evaluation would be      reasonable.  3. Orthopedic.  The patient with some hip and leg pain.  She does have a      history of known sciatica  by CT myelogram.  Plan:  Over-the-counter      nonsteroidal drugs as indicated.  4. Health maintenance.  The patient is status post hysterectomy.  She did      have a pelvic and Pap, I think, in September 2007.  She reports she is      scheduled to see her gynecologist January 2008.  Last mammogram was      April 2007 as noted.  The patient is due for colorectal cancer      screening and will be referred for colonoscopy.   SUMMARY:  A very pleasant woman who does seem medically stable.  I have  encouraged her to begin metoprolol as prescribed.  She will return to see me  on a p.r.n. basis.    ______________________________  Rosalyn Gess Norins, MD    MEN/MedQ  DD: 03/12/2006  DT: 03/12/2006  Job #: [id number]   cc:   Steward Drone L. Canul

## 2010-09-20 NOTE — Cardiovascular Report (Signed)
NAME:  Carla Mccoy, Carla Mccoy               ACCOUNT NO.:  [id number]   MEDICAL RECORD NO.:  [id number]          PATIENT TYPE:  INP   LOCATION:  3733                         FACILITY:  [facility]   PHYSICIAN:  Salvadore Farber, M.D. LHCDATE OF BIRTH:  1950/10/03   DATE OF PROCEDURE:  04/05/2004  DATE OF DISCHARGE:  04/06/2004                              CARDIAC CATHETERIZATION   PROCEDURE:  Left heart catheterization, left ventriculography, coronary  angiography, arch aortography.   INDICATIONS:  Carla Mccoy is a 60 year old lady with obesity and borderline  positive family history who presents with ongoing chest discomfort.  Electrocardiogram demonstrates minor nonspecific ST-T abnormalities.  Due to  her ongoing pain she was referred for diagnostic angiography.   PROCEDURAL TECHNIQUE:  Informed consent was obtained.  Under 1% lidocaine  local anesthesia a 6-French sheath was placed in the right common femoral  artery using the modified Seldinger technique.  Diagnostic angiography and  ventriculography were performed using JL4, JR4, and pigtail catheters.  The  pigtail catheter was then pulled back to the aortic root.  Arch aortography  was performed by power injection.  Patient tolerated the procedure well and  was transferred to the holding room in stable condition.  Sheaths will be  removed there.   COMPLICATIONS:  None.   FINDINGS:  1.  LV 127/1/20.  EF 80% without regional wall motion abnormality.  2.  No aortic stenosis or mitral regurgitation.  3.  There is an intraventricular gradient with a peak of 20 mmHg.  4.  No evidence of aortic dissection.  Normal-appearing aortic arch with      normal arch into the great vessels.  5.  Left main:  Short vessel which is angiographically normal.  6.  LAD:  Moderate sized vessel giving rise to three small diagonals.  It is      angiographically normal.  7.  Circumflex:  Relatively small vessel giving rise to two obtuse      marginals.  It is  angiographically normal.  8.  RCA:  Large, dominant vessel.  It is angiographically normal.   IMPRESSION/RECOMMENDATIONS:  Carla Mccoy has angiographically coronary  arteries and a hypercontractile left ventricle.  Findings are consistent  with, but not diagnostic of hypertrophic cardiomyopathy.  Will discharge her  home on beta blocker and plan outpatient echocardiogram to further  investigate.      Will   WED/MEDQ  D:  04/05/2004  T:  04/06/2004  Job:  [id number]   cc:   Rosalyn Gess. Norins, M.D. [facility]   Maisie Fus C. Wall, M.D.

## 2010-09-20 NOTE — H&P (Signed)
NAME:  Hubers, Onnie               ACCOUNT NO.:  [id number]   MEDICAL RECORD NO.:  [id number]          PATIENT TYPE:  INP   LOCATION:  3733                         FACILITY:  [facility]   PHYSICIAN:  Rosalyn Gess. Norins, M.D. [facility] OF BIRTH:  [birth year]   DATE OF ADMISSION:  04/05/2004  DATE OF DISCHARGE:                                HISTORY & PHYSICAL   CHIEF COMPLAINT:  Chest pain.   HISTORY OF PRESENT ILLNESS:  Ms. Reich is a 60 year old Caucasian woman who  reports being awakened from sleep at about 2 to 3 a.m. by a feeling of heavy  pressure in her substernal chest region.  The patient reports that the pain  has continued to bother her with radiation around to her left side.  The  patient also reports feeling short of breath. She reports some increased  substernal chest pressure with exertion.   CARDIAC RISK FACTORS:  Positive for family history, his mother having died  of an MI at age 17, positive for obesity, positive for being post  menopausal.  Negative for diabetes, tobacco abuse, hyperlipidemia.   Because of the patient's significant discomfort and risk factors, she is now  admitted to rule out for MI.   PAST MEDICAL HISTORY:   SURGICAL:  1.  The patient has initial BTL, followed by a total abdominal hysterectomy.  2.  She has had laparoscopy for adhesions.   MEDICAL:  1.  The patient had Sydenham chorea at age 82.  2.  She has hypothyroid disease.   She has no other significant medical illnesses.   CURRENT MEDICATIONS:  1.  Vitamin E.  2.  Synthroid 175 mcg daily.  3.  Vivelle Dot as instructed.   FAMILY HISTORY:  Mother died of an MI at age 30.  Mother had diabetes.  No  other family history of heart disease.   REVIEW OF SYSTEMS:  Negative for any constitutional, respiratory, GI, or GU  problems.   PHYSICAL EXAMINATION:  VITAL SIGNS:  Temperature 97.7, blood pressure  120/84,pulse 76, rate 216.  GENERAL:  Well-nourished, overweight Caucasian female in no  acute distress.  HEENT:  Normocephalic and atraumatic and unremarkable.  NECK:  Supple without thyromegaly.  NODES:  No adenopathy was noted in cervical or supraclavicular regions.  CHEST:  The patient had tenderness to palpation along the anterior chest  wall and at the left chest wall in the axillary line, although this  discomfort is different from her chief complaint.  CARDIOVASCULAR:  2+ radial pulses.  She had no JVD, no carotid bruits. She  had a quiet precordium with regular rate and rhythm without murmurs, rubs,  or gallops.  ABDOMEN:  Obese with positive bowel sounds in all four quadrants.  The  patient has diffuse tenderness which is somewhat worse in the epigastrium.  PELVIC/RECTAL:  Exams deferred.  EXTREMITIES:  Without clubbing, cyanosis, or edema.  NEUROLOGIC:  Exam is nonfocal.   DATABASE:  A 12-lead electrocardiogram revealed normal sinus rhythm with no  acute injury.   IMPRESSION AND PLAN:  Chest pain in patient with substernal chest pain,  discomfort  with shortness of breath and a moderate risk profile.  She does  have chest wall pain and tenderness in the epigastrium which are both  different discomforts from her chief complaint.   PLAN:  1.  Telemetry admission.  2.  Will check cardiac enzymes x 3.  3.  Will start the patient on Lopressor 25 mg b.i.d.  4.  Will start sublingual nitroglycerin on a p.r.n. basis.  5.  Will start IV heparin with 5000 unit bolus and 800 units now with      pharmacy to follow.  6.  She will be given aspirin.  7.  I have contacted cardiology for consult in regards to further diagnostic      evaluation.       MEN/MEDQ  D:  04/05/2004  T:  04/05/2004  Job:  [id number]

## 2011-01-23 ENCOUNTER — Encounter (HOSPITAL_BASED_OUTPATIENT_CLINIC_OR_DEPARTMENT_OTHER): Payer: 59 | Admitting: Oncology

## 2011-01-23 ENCOUNTER — Other Ambulatory Visit: Payer: Self-pay | Admitting: Oncology

## 2011-01-23 DIAGNOSIS — C50419 Malignant neoplasm of upper-outer quadrant of unspecified female breast: Secondary | ICD-10-CM

## 2011-01-23 LAB — COMPREHENSIVE METABOLIC PANEL
ALT: 32 U/L (ref 0–35)
AST: 51 U/L — ABNORMAL HIGH (ref 0–37)
Albumin: 3.3 g/dL — ABNORMAL LOW (ref 3.5–5.2)
BUN: 11 mg/dL (ref 6–23)
CO2: 23 mEq/L (ref 19–32)
Calcium: 8.8 mg/dL (ref 8.4–10.5)
Chloride: 103 mEq/L (ref 96–112)
Potassium: 3.7 mEq/L (ref 3.5–5.3)

## 2011-01-23 LAB — CBC WITH DIFFERENTIAL/PLATELET
Basophils Absolute: 0 10*3/uL (ref 0.0–0.1)
EOS%: 2.5 % (ref 0.0–7.0)
HCT: 39.5 % (ref 34.8–46.6)
HGB: 13.7 g/dL (ref 11.6–15.9)
MCH: 31.8 pg (ref 25.1–34.0)
MONO#: 0.4 10*3/uL (ref 0.1–0.9)
NEUT#: 4.4 10*3/uL (ref 1.5–6.5)
NEUT%: 59.5 % (ref 38.4–76.8)
RDW: 12.8 % (ref 11.2–14.5)
WBC: 7.4 10*3/uL (ref 3.9–10.3)
lymph#: 2.4 10*3/uL (ref 0.9–3.3)

## 2011-01-24 LAB — HEMOGLOBIN A1C
Hgb A1c MFr Bld: 6.6 % — ABNORMAL HIGH (ref <5.7)
Mean Plasma Glucose: 143 mg/dL — ABNORMAL HIGH (ref <117)

## 2011-01-24 LAB — VITAMIN D 25 HYDROXY (VIT D DEFICIENCY, FRACTURES): Vit D, 25-Hydroxy: 41 ng/mL (ref 30–89)

## 2011-01-30 ENCOUNTER — Encounter (HOSPITAL_BASED_OUTPATIENT_CLINIC_OR_DEPARTMENT_OTHER): Payer: 59 | Admitting: Oncology

## 2011-01-30 DIAGNOSIS — C50419 Malignant neoplasm of upper-outer quadrant of unspecified female breast: Secondary | ICD-10-CM

## 2011-01-30 DIAGNOSIS — R634 Abnormal weight loss: Secondary | ICD-10-CM

## 2011-01-30 DIAGNOSIS — R7989 Other specified abnormal findings of blood chemistry: Secondary | ICD-10-CM

## 2011-01-30 DIAGNOSIS — R1011 Right upper quadrant pain: Secondary | ICD-10-CM

## 2011-01-31 ENCOUNTER — Other Ambulatory Visit: Payer: Self-pay | Admitting: Oncology

## 2011-01-31 DIAGNOSIS — C50919 Malignant neoplasm of unspecified site of unspecified female breast: Secondary | ICD-10-CM

## 2011-02-07 ENCOUNTER — Ambulatory Visit (HOSPITAL_COMMUNITY)
Admission: RE | Admit: 2011-02-07 | Discharge: 2011-02-07 | Disposition: A | Discharge status: Home / Self Care | Payer: 59 | Source: Ambulatory Visit | Attending: Oncology | Admitting: Oncology

## 2011-02-07 DIAGNOSIS — M899 Disorder of bone, unspecified: Secondary | ICD-10-CM | POA: Insufficient documentation

## 2011-02-07 DIAGNOSIS — M949 Disorder of cartilage, unspecified: Secondary | ICD-10-CM | POA: Insufficient documentation

## 2011-02-07 DIAGNOSIS — K7689 Other specified diseases of liver: Secondary | ICD-10-CM | POA: Insufficient documentation

## 2011-02-07 DIAGNOSIS — M25559 Pain in unspecified hip: Secondary | ICD-10-CM | POA: Insufficient documentation

## 2011-02-07 DIAGNOSIS — C50919 Malignant neoplasm of unspecified site of unspecified female breast: Secondary | ICD-10-CM | POA: Insufficient documentation

## 2011-02-07 DIAGNOSIS — R7989 Other specified abnormal findings of blood chemistry: Secondary | ICD-10-CM | POA: Insufficient documentation

## 2011-02-07 DIAGNOSIS — M47817 Spondylosis without myelopathy or radiculopathy, lumbosacral region: Secondary | ICD-10-CM | POA: Insufficient documentation

## 2011-02-07 DIAGNOSIS — N281 Cyst of kidney, acquired: Secondary | ICD-10-CM | POA: Insufficient documentation

## 2011-02-07 MED ORDER — GADOBENATE DIMEGLUMINE 529 MG/ML IV SOLN
19.0000 mL | Freq: Once | INTRAVENOUS | Status: AC | PRN
  Administered 2011-02-07: 19 mL via INTRAVENOUS

## 2011-02-11 ENCOUNTER — Other Ambulatory Visit: Payer: Self-pay | Admitting: Oncology

## 2011-02-11 ENCOUNTER — Encounter (INDEPENDENT_AMBULATORY_CARE_PROVIDER_SITE_OTHER): Payer: Self-pay | Admitting: Surgery

## 2011-02-11 DIAGNOSIS — C50919 Malignant neoplasm of unspecified site of unspecified female breast: Secondary | ICD-10-CM

## 2011-02-19 ENCOUNTER — Ambulatory Visit (HOSPITAL_COMMUNITY): Payer: 59

## 2011-02-19 ENCOUNTER — Encounter (HOSPITAL_COMMUNITY): Payer: 59

## 2011-03-05 ENCOUNTER — Encounter (HOSPITAL_COMMUNITY)
Admission: RE | Admit: 2011-03-05 | Discharge: 2011-03-05 | Disposition: A | Discharge status: Home / Self Care | Payer: 59 | Source: Ambulatory Visit | Attending: Oncology | Admitting: Oncology

## 2011-03-05 ENCOUNTER — Ambulatory Visit (HOSPITAL_COMMUNITY)
Admission: RE | Admit: 2011-03-05 | Discharge: 2011-03-05 | Disposition: A | Discharge status: Home / Self Care | Payer: 59 | Source: Ambulatory Visit | Attending: Oncology | Admitting: Oncology

## 2011-03-05 DIAGNOSIS — M949 Disorder of cartilage, unspecified: Secondary | ICD-10-CM | POA: Insufficient documentation

## 2011-03-05 DIAGNOSIS — C50919 Malignant neoplasm of unspecified site of unspecified female breast: Secondary | ICD-10-CM | POA: Insufficient documentation

## 2011-03-05 DIAGNOSIS — M899 Disorder of bone, unspecified: Secondary | ICD-10-CM | POA: Insufficient documentation

## 2011-03-05 MED ORDER — TECHNETIUM TC 99M MEDRONATE IV KIT
25.0000 | PACK | Freq: Once | INTRAVENOUS | Status: AC | PRN
  Administered 2011-03-05: 25 via INTRAVENOUS

## 2011-03-11 ENCOUNTER — Telehealth: Payer: Self-pay | Admitting: *Deleted

## 2011-03-20 NOTE — Telephone Encounter (Signed)
Refill requested

## 2011-04-05 ENCOUNTER — Other Ambulatory Visit: Payer: Self-pay | Admitting: Internal Medicine

## 2011-07-25 ENCOUNTER — Other Ambulatory Visit: Payer: Self-pay | Admitting: Dermatology

## 2011-09-01 ENCOUNTER — Ambulatory Visit (INDEPENDENT_AMBULATORY_CARE_PROVIDER_SITE_OTHER): Payer: 59 | Admitting: Internal Medicine

## 2011-09-01 ENCOUNTER — Encounter: Payer: Self-pay | Admitting: Internal Medicine

## 2011-09-01 ENCOUNTER — Other Ambulatory Visit (INDEPENDENT_AMBULATORY_CARE_PROVIDER_SITE_OTHER): Payer: 59

## 2011-09-01 VITALS — BP 130/78 | HR 89 | Temp 98.0°F | Resp 16 | Wt 193.0 lb

## 2011-09-01 DIAGNOSIS — R7309 Other abnormal glucose: Secondary | ICD-10-CM

## 2011-09-01 DIAGNOSIS — R252 Cramp and spasm: Secondary | ICD-10-CM

## 2011-09-01 DIAGNOSIS — E039 Hypothyroidism, unspecified: Secondary | ICD-10-CM

## 2011-09-01 DIAGNOSIS — K7689 Other specified diseases of liver: Secondary | ICD-10-CM

## 2011-09-01 DIAGNOSIS — I10 Essential (primary) hypertension: Secondary | ICD-10-CM

## 2011-09-01 DIAGNOSIS — E785 Hyperlipidemia, unspecified: Secondary | ICD-10-CM

## 2011-09-01 DIAGNOSIS — Z23 Encounter for immunization: Secondary | ICD-10-CM

## 2011-09-01 DIAGNOSIS — Z0000 Encounter for general adult medical examination without abnormal findings: Secondary | ICD-10-CM

## 2011-09-01 DIAGNOSIS — M549 Dorsalgia, unspecified: Secondary | ICD-10-CM

## 2011-09-01 LAB — COMPREHENSIVE METABOLIC PANEL
AST: 82 U/L — ABNORMAL HIGH (ref 0–37)
BUN: 13 mg/dL (ref 6–23)
Calcium: 9.8 mg/dL (ref 8.4–10.5)
Chloride: 102 mEq/L (ref 96–112)
Creatinine, Ser: 0.8 mg/dL (ref 0.4–1.2)
GFR: 77.1 mL/min (ref >60.00)

## 2011-09-01 LAB — CBC WITH DIFFERENTIAL/PLATELET
Basophils Absolute: 0.1 10*3/uL (ref 0.0–0.1)
HCT: 45.3 % (ref 36.0–46.0)
Lymphocytes Relative: 33 % (ref 12.0–46.0)
Lymphs Abs: 3.3 10*3/uL (ref 0.7–4.0)
Monocytes Relative: 5.4 % (ref 3.0–12.0)
Platelets: 252 10*3/uL (ref 150.0–400.0)
RDW: 12.8 % (ref 11.5–14.6)

## 2011-09-01 LAB — HEPATIC FUNCTION PANEL
Bilirubin, Direct: 0.2 mg/dL (ref 0.0–0.3)
Total Bilirubin: 0.7 mg/dL (ref 0.3–1.2)

## 2011-09-01 LAB — LDL CHOLESTEROL, DIRECT: Direct LDL: 89.7 mg/dL

## 2011-09-01 LAB — LIPID PANEL
Cholesterol: 287 mg/dL — ABNORMAL HIGH (ref 0–200)
VLDL: 128 mg/dL — ABNORMAL HIGH (ref 0.0–40.0)

## 2011-09-01 LAB — TSH: TSH: 4.02 u[IU]/mL (ref 0.35–5.50)

## 2011-09-01 NOTE — Progress Notes (Signed)
Subjective:    Patient ID: Carla Mccoy, female    DOB: 04-01-1951, 61 y.o.   MRN: [id number]  HPI Carla Mccoy presents for an annual medical exam. She does see her gynecologist - Dr. Genice Rouge and she is planning to be seen in the next several weeks. She has been feeling generally well. She does have floaters and she has epithelial dystrophy of the eye.   Activities of daily living:  The patient is 100% inedpendent in all ADLs: dressing, toileting, feeding as well as independent mobility  Home safety : The patient has smoke detectors in the home. They wear seatbelts. firearms are present in the home, kept in a safe fashion.  There is no risks for hepatitis, STDs or HIV. There is history of blood transfusion. They have no travel history to infectious disease endemic areas of the world.  The patient has note seen their dentist in the last six month has full dentures. They have seen their eye doctor in the last year. They admit to any hearing difficulty and have not had audiologic testing in the last year.  They do not  have excessive sun exposure. Discussed the need for sun protection: hats, long sleeves and use of sunscreen if there is significant sun exposure.   Diet: the importance of a healthy diet is discussed. They do have a healthy diet.  The patient has a regular exercise program: Carla Mccoy , 40 duration, 3 per week.  The benefits of regular aerobic exercise were discussed.  Depression screen: there are no signs or vegative symptoms of depression- irritability, change in appetite, anhedonia, sadness/tearfullness.  Cognitive assessment: the patient manages all their financial and personal affairs and is actively engaged.   The following portions of the patient's history were reviewed and updated as appropriate: allergies, current medications, past family history, past medical history,  past surgical history, past social history  and problem list.  Vision, hearing, body mass index  were assessed and reviewed.   During the course of the visit the patient was educated and counseled about appropriate screening and preventive services including : fall prevention , diabetes screening, nutrition counseling, colorectal cancer screening, and recommended immunizations.   Past Medical History  Diagnosis Date  . Tachy-brady syndrome   . Backache, unspecified   . Personal history of peptic ulcer disease   . 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 Other and unspecified hyperlipidemia   . Other abnormal glucose   . Obesity, unspecified   . 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 Other chronic nonalcoholic liver disease   . Migraine without aura, without mention of intractable migraine without mention of status migrainosus   . Unspecified hypothyroidism   . Unspecified essential hypertension   . Cancer     breast cancer left - lumpectomy   Past Surgical History  Procedure Date  . Laparoscopy abdomen diagnostic   . Tonsillectomy   . Tubal ligation   . Abdominal hysterectomy   . Breast lumpectomy     Feb '11  . Breast biopsy     in the 80's - benign tissue   Family History  Problem Relation Age of Onset  . Peripheral vascular disease Mother   . Coronary artery disease Mother   . Hyperlipidemia Mother   . Heart disease Mother     CAD/fatal MI  . Diabetes Mother   . Stroke Mother   . Diabetes Sister   .  Hypothyroidism Sister   . Diabetes Brother   . Hyperlipidemia Brother   . Heart disease Brother    History   Social History  . Marital Status: Married    Spouse Name: N/A    Number of Children: N/A  . Years of Education: 14   Occupational History  . Firefighter    Social History Main Topics  . Smoking status: Never Smoker   . Smokeless tobacco: Never Used  . Alcohol Use: No  . Drug Use: No  . Sexually Active: Yes -- Female partner(s)   Other Topics Concern  . Not on file   Social  History Narrative   HSG, [employer] - 3 yrs, no degree. Married -'55  1 son -'24, 1 daughter-'70; 2 grandchildren.  Work - Patent examiner for [employer].  Marriage is in good health. She enjoys her work.   Current Outpatient Prescriptions on File Prior to Visit  Medication Sig Dispense Refill  . Cholecalciferol (VITAMIN D3) 2000 UNITS TABS Take by mouth.        . levothyroxine (SYNTHROID, LEVOTHROID) 175 MCG tablet TAKE ONE TABLET BY MOUTH EVERY DAY  90 tablet  1  . Multiple Vitamins-Minerals (MULTIVITAMIN,TX-MINERALS) tablet Take 1 tablet by mouth daily.        . vitamin E 100 UNIT capsule Take 100 Units by mouth daily.              Review of Systems Constitutional:  Negative for fever, chills, activity change and unexpected weight change.  HEENT:  Negative for hearing loss, ear pain, congestion, neck stiffness and postnasal drip. Negative for sore throat or swallowing problems. Negative for dental complaints.   Eyes: Negative for vision loss or change in visual acuity.  Respiratory: Negative for chest tightness and wheezing. Negative for DOE.   Cardiovascular: Negative for chest pain or palpitations. No decreased exercise tolerance Gastrointestinal: No change in bowel habit. No bloating or gas. No reflux or indigestion Genitourinary: Negative for urgency, frequency, flank pain and difficulty urinating.  Musculoskeletal: Negative for myalgias, back pain, arthralgias and gait problem.  Neurological: Negative for dizziness, tremors, weakness and headaches.  Hematological: Negative for adenopathy.  Psychiatric/Behavioral: Negative for behavioral problems and dysphoric mood.       Objective:   Physical Exam Filed Vitals:   09/01/11 1347  BP: 130/78  Pulse: 89  Temp: 98 F (36.7 C)  Resp: 16   Wt Readings from Last 3 Encounters:  09/01/11 193 lb (87.544 kg)  08/08/10 201 lb (91.173 kg)  04/12/09 221 lb (100.245 kg)    Gen'l: well nourished, well developed white woman in no  distress HEENT - [place]/AT, EACs/TMs normal, oropharynx with full dentures, no buccal or palatal lesions, posterior pharynx clear, mucous membranes moist. C&S clear, PERRLA, fundi - normal Neck - supple, no thyromegaly Nodes- negative submental, cervical, supraclavicular regions Chest - no deformity, no CVAT Lungs - cleat without rales, wheezes. No increased work of breathing Breast - deferred oncology Cardiovascular - regular rate and rhythm, quiet precordium, no murmurs, rubs or gallops, 2+ radial, DP and PT pulses Abdomen - BS+ x 4, no HSM, no guarding or rebound or tenderness Pelvic - deferred to gyn Rectal - deferred to gyn Extremities - no clubbing, cyanosis, edema or deformity.  Neuro - A&O x 3, CN II-XII normal, motor strength normal and equal, DTRs 2+ and symmetrical biceps, radial, and patellar tendons. Cerebellar - no tremor, no rigidity, fluid movement and normal gait. Derm - Head, neck, back, abdomen  and extremities without suspicious lesions  Lab Results  Component Value Date   WBC 10.1 09/01/2011   HGB 15.6* 09/01/2011   HCT 45.3 09/01/2011   PLT 252.0 09/01/2011   GLUCOSE 117* 09/01/2011   CHOL 287* 09/01/2011   TRIG 640.0* 09/01/2011   HDL 63.40 09/01/2011   LDLDIRECT 89.7 09/01/2011   LDLCALC 125* 03/06/2006   ALT 40* 09/01/2011   ALT 40* 09/01/2011   AST 82* 09/01/2011   AST 82* 09/01/2011   NA 140 09/01/2011   K 5.5* 09/01/2011   CL 102 09/01/2011   CREATININE 0.8 09/01/2011   BUN 13 09/01/2011   CO2 28 09/01/2011   TSH 4.02 09/01/2011   HGBA1C 6.6* 01/23/2011                    Assessment & Plan:

## 2011-09-05 ENCOUNTER — Telehealth: Payer: Self-pay

## 2011-09-05 NOTE — Telephone Encounter (Signed)
Patient called stating that she was seen by MD and told to call back if she wants to proceed with MRA. Patient would like to proceed as long as it would be covered by insurance. Thanks

## 2011-09-06 DIAGNOSIS — Z0000 Encounter for general adult medical examination without abnormal findings: Secondary | ICD-10-CM | POA: Insufficient documentation

## 2011-09-06 NOTE — Assessment & Plan Note (Signed)
BP Readings from Last 3 Encounters:  09/01/11 130/78  08/08/10 138/80  04/12/09 122/86   Good control. No indication for medical management

## 2011-09-06 NOTE — Assessment & Plan Note (Signed)
Last A1C 6.6 % '12, serum glucose today 117 - borderline high.  Plan-  Hyperglycemic and very close to meeting definition of diabetic.  Best approach - life-style management: as much as possible a NO SUGAR diet and low carbs             Lab in 6 months - A1C

## 2011-09-06 NOTE — Assessment & Plan Note (Signed)
Interval history without events. Physical exam is normal. Labs reveal elevated triglycerides, otherwise normal. She is current with mammography  - current guidelines for every other year should she choose; current with colorectal cancer screening. Immunizations - she will return for shingles vaccine if covered by insurance, she is otherwise current.  In summary - a very nice woman who has hepatic steatosis (fatty liver) which she will approach with life-style, i.e. Low fat diet, exercise and weight loss. She will start fish oil to help reduce triglycerides. She is going to retire and this may provide ample opportunity to develop an active exercise program. She will return for lab as noted above, for office follow-up as needed.

## 2011-09-06 NOTE — Assessment & Plan Note (Signed)
LDL cholesterol in improved and in normal range. Triglycerides are very high at 640.  Plan Low fat diet  Omega - 3 fatty acids (fish oil) 3 g /day. OTC before Rx  Repeat lipid panel in 3 months-order entered

## 2011-09-06 NOTE — Assessment & Plan Note (Signed)
Persistent mild elevation of LFTs; no abdominal pain. MR w/o evidence of cirrhosis  Plan-  weight loss  Management of cholesterol - see separate problem  U/S liver at next annual visit.

## 2011-09-06 NOTE — Assessment & Plan Note (Signed)
Continues to deal with intermittent pain. She has no radiculopathy on exam.

## 2011-09-06 NOTE — Assessment & Plan Note (Signed)
Lab Results  Component Value Date   TSH 4.02 09/01/2011   Good control on present dose of levothyroxine - no change.

## 2011-09-10 ENCOUNTER — Telehealth: Payer: Self-pay

## 2011-09-10 NOTE — Telephone Encounter (Signed)
Patient has called back She has been having headache- new for her. She does have a very positive Fm Hx for cerebral aneurysm fatal in females in their 16's  Plan - MRA order placed with [facility]

## 2011-09-10 NOTE — Telephone Encounter (Signed)
Pt called stating that she would like to proceed with MRA of her head as discussed with MEN. Pt's is requesting testing be done this month for insurance reasons.

## 2011-09-23 ENCOUNTER — Ambulatory Visit
Admission: RE | Admit: 2011-09-23 | Discharge: 2011-09-23 | Disposition: A | Discharge status: Home / Self Care | Payer: 59 | Source: Ambulatory Visit | Attending: Internal Medicine | Admitting: Internal Medicine

## 2011-10-01 ENCOUNTER — Telehealth: Payer: Self-pay | Admitting: *Deleted

## 2011-10-01 NOTE — Telephone Encounter (Signed)
Patient notified of MRA report.

## 2011-10-01 NOTE — Telephone Encounter (Signed)
Message copied by Elnora Morrison on Wed Oct 01, 2011  8:30 AM ------      Message from: Illene Regulus E      Created: Mon Sep 29, 2011  9:30 PM       Please call patient - MRA with no evidence of aneurysm or vascular abnormality

## 2011-10-30 ENCOUNTER — Other Ambulatory Visit: Payer: Self-pay | Admitting: Internal Medicine

## 2012-01-03 ENCOUNTER — Telehealth: Payer: Self-pay | Admitting: Oncology

## 2012-01-03 NOTE — Telephone Encounter (Signed)
S/w pt re appts for 10/2 and 10/9. Pt requested that cholesterol and triglicerides be added to her 10/2 lb. Pt aware message would be sent to nurse. lmonvm for nurse re pt's request.

## 2012-01-23 ENCOUNTER — Encounter: Payer: Self-pay | Admitting: Medical Oncology

## 2012-01-29 ENCOUNTER — Telehealth: Payer: Self-pay | Admitting: Oncology

## 2012-01-29 NOTE — Telephone Encounter (Signed)
lmonvm adviisng the pt of her cancelled oct appts that have been r/s to nov and dec due to a change in the md's schedule.

## 2012-01-30 ENCOUNTER — Other Ambulatory Visit: Payer: Self-pay | Admitting: *Deleted

## 2012-01-30 MED ORDER — TAMOXIFEN CITRATE 20 MG PO TABS: 20.0000 mg | ORAL_TABLET | Freq: Every day | ORAL | Status: DC

## 2012-02-03 ENCOUNTER — Telehealth: Payer: Self-pay | Admitting: Oncology

## 2012-02-03 NOTE — Telephone Encounter (Signed)
S/w pt re appts for 10/3 lb @ 10:15 am and 10/8 f/u @ 8:30 am. appts r/s from 11/25 and 12/2 per VAL. D/t for f/u per Val.

## 2012-02-04 ENCOUNTER — Other Ambulatory Visit: Payer: 59 | Admitting: Lab

## 2012-02-05 ENCOUNTER — Other Ambulatory Visit (HOSPITAL_BASED_OUTPATIENT_CLINIC_OR_DEPARTMENT_OTHER): Payer: 59 | Admitting: Lab

## 2012-02-05 DIAGNOSIS — C50419 Malignant neoplasm of upper-outer quadrant of unspecified female breast: Secondary | ICD-10-CM

## 2012-02-05 LAB — HEMOGLOBIN A1C: Mean Plasma Glucose: 128 mg/dL — ABNORMAL HIGH (ref <117)

## 2012-02-05 LAB — CBC WITH DIFFERENTIAL/PLATELET
Basophils Absolute: 0.1 10*3/uL (ref 0.0–0.1)
HCT: 43.9 % (ref 34.8–46.6)
HGB: 15 g/dL (ref 11.6–15.9)
MONO#: 0.5 10*3/uL (ref 0.1–0.9)
NEUT#: 4.7 10*3/uL (ref 1.5–6.5)
NEUT%: 59 % (ref 38.4–76.8)
WBC: 8 10*3/uL (ref 3.9–10.3)
lymph#: 2.5 10*3/uL (ref 0.9–3.3)

## 2012-02-05 LAB — COMPREHENSIVE METABOLIC PANEL (CC13)
ALT: 32 U/L (ref 0–55)
Albumin: 3.7 g/dL (ref 3.5–5.0)
BUN: 13 mg/dL (ref 7.0–26.0)
CO2: 24 mEq/L (ref 22–29)
Calcium: 9.5 mg/dL (ref 8.4–10.4)
Chloride: 106 mEq/L (ref 98–107)
Creatinine: 1 mg/dL (ref 0.6–1.1)
Potassium: 4.4 mEq/L (ref 3.5–5.1)

## 2012-02-05 LAB — CANCER ANTIGEN 27.29: CA 27.29: 40 U/mL — ABNORMAL HIGH (ref 0–39)

## 2012-02-10 ENCOUNTER — Telehealth: Payer: Self-pay | Admitting: Oncology

## 2012-02-10 ENCOUNTER — Ambulatory Visit (HOSPITAL_BASED_OUTPATIENT_CLINIC_OR_DEPARTMENT_OTHER): Payer: 59 | Admitting: Oncology

## 2012-02-10 VITALS — BP 146/80 | HR 73 | Temp 97.9°F | Resp 20 | Ht 66.0 in | Wt 185.6 lb

## 2012-02-10 DIAGNOSIS — Z170 Estrogen receptor positive status [ER+]: Secondary | ICD-10-CM

## 2012-02-10 DIAGNOSIS — Z853 Personal history of malignant neoplasm of breast: Secondary | ICD-10-CM | POA: Insufficient documentation

## 2012-02-10 DIAGNOSIS — C50919 Malignant neoplasm of unspecified site of unspecified female breast: Secondary | ICD-10-CM

## 2012-02-10 DIAGNOSIS — D4989 Neoplasm of unspecified behavior of other specified sites: Secondary | ICD-10-CM

## 2012-02-10 MED ORDER — TAMOXIFEN CITRATE 20 MG PO TABS: 20.0000 mg | ORAL_TABLET | Freq: Every day | ORAL | Status: DC

## 2012-02-10 MED ORDER — FLUCONAZOLE 100 MG PO TABS: 100.0000 mg | ORAL_TABLET | Freq: Every day | ORAL | Status: DC

## 2012-02-10 MED ORDER — GABAPENTIN 300 MG PO CAPS: 300.0000 mg | ORAL_CAPSULE | Freq: Three times a day (TID) | ORAL | Status: DC

## 2012-02-10 MED ORDER — KETOCONAZOLE 2 % EX CREA: TOPICAL_CREAM | Freq: Every day | CUTANEOUS | Status: DC

## 2012-02-10 NOTE — Progress Notes (Signed)
ID: [name]   DOB: 1950-07-30  MR#: [id number]  WUJ#:[id number]  PCP: Illene Regulus, MD GYN: Meredeth Ide MD SU: Harriette Bouillon MD OTHER MD: Laurette Schimke, Lina Sar, Max Fickle   HISTORY OF PRESENT ILLNESS: Carla Mccoy had routine yearly mammography April 12, 2009, showing a possible mass in the left breast.  Her breasts are dense which of course limits mammographic sensitivity.  She was recalled December 15th for additional views and Dr. Judyann Munson noted a density in the lateral aspect of the left breast.  There were no associated malignant type calcifications and it was not palpable by exam.  By ultrasound, there was sonographic positivity noted and biopsy was obtained the same day showing ([id number]) benign breast parenchyma on the left.  MRI of the breast was obtained on December 21st for further evaluation, and showed an irregular spiculated mass measuring 1.5 cm in the left breast, corresponding with the mammographic abnormality.  In the right breast, there was an area of clumped enhancement measuring up to 3.7 cm.  Bilateral ultrasounds were then performed December 28th. Again, there were no palpable masses in either breast.  In the right breast, there were multiple irregular hypoechoic areas but no discrete abnormality corresponding to the abnormal enhancement in the MRI.  In the left breast, there were three separate masses with multiple areas of ill-defined shadowing, but there were no definite findings correlating with the irregular area of enhancement seen in the recent MRI.    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  The prognostic profile showed the tumor to be not amplified for Her-2 with a ratio of 0.8.  ER was positive at 99%. PR was positive at 100%, and the MIB-1 was 13%.    With this information,  the patient was referred to Dr. Luisa Hart and after appropriate discussion, he proceeded to definitive surgery February 1st, 2011, the final pathology from that procedure ([id number]) showing a 1.2 cm invasive mammary carcinoma, primarily ductal with some lobular features, with negative margins and 0 of 2 sentinel lymph nodes involved.  The tumor was grade 1.  The closest margin was 3 mm posteriorly.  There was no evidence of lymphovascular invasion.    INTERVAL HISTORY: Carla Mccoy returns today with her husband Carla Mccoy for followup of her breast cancer. She is tolerating her tamoxifen with significant hot flashes and vaginal dryness issues. Also, since the last visit here, she retired and has started at walking program, and minimum of 3 days a week. She is very pleased that she has lost approximately 20 pounds over the last 2 years.  REVIEW OF SYSTEMS: She has noted a "bump" in her left buttock that she wanted me to evaluate. She has occasional numbness in her fingers, which is not a new problem. This is intermittent. She had an episode after working in the yard hours she felt nauseated and dizzy. She came inside rest a little and felt fine. That has not recurred. Otherwise a detailed review of systems today was noncontributory  PAST MEDICAL HISTORY: Past Medical History  Diagnosis Date  . Tachy-brady syndrome   . Backache, unspecified   . Personal history of peptic ulcer disease   . 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 Other and unspecified hyperlipidemia   . Other abnormal glucose   . Obesity, unspecified   .  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 Other chronic nonalcoholic liver disease   . Migraine without aura, without mention of intractable migraine without mention of status migrainosus   . Unspecified hypothyroidism   . Unspecified essential hypertension   . Cancer     breast cancer left - lumpectomy  1. Significant for  hypothyroidism.   2. History of remote tonsillectomy.   3. History of remote tubal ligation.   4. History of simple hysterectomy secondary to fibroids in 1988.   5. History of exploratory laparotomy for a scar tissue due to her prior tubal ligation (which was done in the era pre-laparoscopy).  6. History of appendectomy.   7. History of remote biopsy of a left breast mass which was benign.   History of melanoma:  We have dermatopathology reports for June 01, 2006, showing [phone]) a superficial spreading melanoma with a Breslow measurement of 0.6 mm, Clark's level a thin 4 with no evidence of regression or ulceration.  No vascular invasion.  Margins were close and she had a deeper excision on February 15th showing no residual melanoma.  This was obtained from the left lateral upper breast.  The patient also had a dysplastic nevus removed from the left upper scapular skin area ([id number]).  The patient has a history of borderline hypertension, history of mild migraines, history of carpal tunnel syndrome and mild dyslipidemia.    PAST SURGICAL HISTORY: Past Surgical History  Procedure Date  . Laparoscopy abdomen diagnostic   . Tonsillectomy   . Tubal ligation   . Abdominal hysterectomy   . Breast lumpectomy     Feb '11  . Breast biopsy     in the 80's - benign tissue    FAMILY HISTORY Family History  Problem Relation Age of Onset  . Peripheral vascular disease Mother   . Coronary artery disease Mother   . Hyperlipidemia Mother   . Heart disease Mother     CAD/fatal MI  . Diabetes Mother   . Stroke Mother   . Diabetes Sister   . Hypothyroidism Sister   . Diabetes Brother   . Hyperlipidemia Brother   . Heart disease Brother   The patient's father was murdered in a robbery at the age of 79.  The patient's mother died at the age of 72 from a myocardial infarction in the setting of diabetes.  The patient has five siblings.  There are no first-degree relatives with ovarian or breast  cancer to her knowledge.  The patient's maternal grandmother was diagnosed with breast cancer at the age of 21 and died shortly thereafter.    GYNECOLOGIC HISTORY: She is GX P2.  First pregnancy to term at age 68.  The patient had her hysterectomy in 1988.  Approximately 10 years later, she started having menopausal symptoms and was treated with Premarin which she tolerated poorly, then the Femring which was uncomfortable, and finally with an estrogen patch which was stopped with her breast cancer diagnosis.    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  Her husband, Carla Mccoy, is a retired Biomedical scientist. Their daughter Carla Mccoy works as a Engineer, civil (consulting) for [employer].  Son Carla Mccoy is a truck Hospital doctor.  The patient has two grandchildren.  She attends the [employer].   ADVANCED DIRECTIVES:  HEALTH MAINTENANCE: History  Substance Use Topics  . Smoking status: Never Smoker   . Smokeless tobacco: Never Used  . Alcohol  Use: No     Colonoscopy:  PAP:  Bone density:  Lipid panel:  Allergies  Allergen Reactions  . Codeine   . Sulfonamide Derivatives     Current Outpatient Prescriptions  Medication Sig Dispense Refill  . Cholecalciferol (VITAMIN D3) 2000 UNITS TABS Take by mouth.        . levothyroxine (SYNTHROID, LEVOTHROID) 175 MCG tablet TAKE ONE TABLET BY MOUTH EVERY DAY  90 tablet  3  . tamoxifen (NOLVADEX) 20 MG tablet Take 1 tablet (20 mg total) by mouth daily.  90 tablet  4  . vitamin E 100 UNIT capsule Take 100 Units by mouth daily.        . Multiple Vitamins-Minerals (MULTIVITAMIN,TX-MINERALS) tablet Take 1 tablet by mouth daily.          OBJECTIVE: Middle-aged white woman who appears well Filed Vitals:   02/10/12 0853  BP: 146/80  Pulse: 73  Temp: 97.9 F (36.6 C)  Resp: 20     Body mass index is 29.96 kg/(m^2).    ECOG FS: 0  Sclerae unicteric Oropharynx clear No cervical or supraclavicular adenopathy Lungs no  rales or rhonchi Heart regular rate and rhythm Abd benign MSK no focal spinal tenderness, no peripheral edema; in the left buttock area there is a fairly deep palpable mass measuring approximately 2 cm, hard and movable. There is no erythema or other skin change noted Neuro: nonfocal Breasts: The right breast is unremarkable. The left breast status post lumpectomy and radiation. There is no evidence of local recurrence. The left axilla is benign.  LAB RESULTS: Lab Results  Component Value Date   WBC 8.0 02/05/2012   NEUTROABS 4.7 02/05/2012   HGB 15.0 02/05/2012   HCT 43.9 02/05/2012   MCV 93.2 02/05/2012   PLT 216 02/05/2012      Chemistry      Component Value Date/Time   NA 140 02/05/2012 1023   NA 140 09/01/2011 1504   K 4.4 02/05/2012 1023   K 5.5* 09/01/2011 1504   CL 106 02/05/2012 1023   CL 102 09/01/2011 1504   CO2 24 02/05/2012 1023   CO2 28 09/01/2011 1504   BUN 13.0 02/05/2012 1023   BUN 13 09/01/2011 1504   CREATININE 1.0 02/05/2012 1023   CREATININE 0.8 09/01/2011 1504      Component Value Date/Time   CALCIUM 9.5 02/05/2012 1023   CALCIUM 9.8 09/01/2011 1504   ALKPHOS 57 02/05/2012 1023   ALKPHOS 54 09/01/2011 1504   ALKPHOS 54 09/01/2011 1504   AST 54* 02/05/2012 1023   AST 82* 09/01/2011 1504   AST 82* 09/01/2011 1504   ALT 32 02/05/2012 1023   ALT 40* 09/01/2011 1504   ALT 40* 09/01/2011 1504   BILITOT 0.60 02/05/2012 1023   BILITOT 0.7 09/01/2011 1504   BILITOT 0.7 09/01/2011 1504       Lab Results  Component Value Date   LABCA2 40* 02/05/2012    No components found with this basename: [id number]    No results found for this basename: INR:1;PROTIME:1 in the last 168 hours  Urinalysis    Component Value Date/Time   COLORURINE YELLOW 03/06/2006 1318   LABSPEC > OR = 1.030 03/06/2006 1318   PHURINE 5.0 03/06/2006 1318   BILIRUBINUR NEGATIVE 03/06/2006 1318   KETONESUR TRACE* 03/06/2006 1318   UROBILINOGEN 0.2 mg/dL 54/0/9811 9147   NITRITE Negative 03/06/2006 1318    LEUKOCYTESUR Negative 03/06/2006 1318    STUDIES: No results found.  ASSESSMENT: 61 y.o.  Carla Mccoy woman status post left lumpectomy and sentinel lymph node sampling February 2011 for a T1C N0 grade 1 ductal breast cancer with some lobular features, strongly estrogen and progesterone receptor positive, HER-2 negative, with an MIB-1 of 13%.  She completed radiation in April 2011 and started tamoxifen (after a very brief stint on Arimidex with very poor tolerance) July 2011  PLAN: [place] is certainly looks well. I think we can make her tamoxifen a little bit more friendly. She understands that while she is on tamoxifen there is no concern regarding use of vaginal estrogens for vaginal atrophy issues, so I suggested she of use Vagifem suppositories twice a week for the next several weeks until her symptoms improved, then weekly, and then as needed. I also think she would benefit from gabapentin at bedtime, since she is having some difficulty with hot flashes waking her up.  I am hopeful the mass in her left buttock is going to be benign, but we need to image and may need to remove it. She has been set up for a CT of the abdomen and pelvis with special views through the area in question. If it is not unequivocally benign by films I will refer her back to Dr. Luisa Hart for excision.   MAGRINAT,GUSTAV C    02/10/2012

## 2012-02-10 NOTE — Telephone Encounter (Signed)
gve the pt her ct scan/x-ray appt along with the April 2014 appt calendar.

## 2012-02-11 ENCOUNTER — Ambulatory Visit: Payer: 59 | Admitting: Oncology

## 2012-02-16 ENCOUNTER — Ambulatory Visit (HOSPITAL_COMMUNITY)
Admission: RE | Admit: 2012-02-16 | Discharge: 2012-02-16 | Disposition: A | Discharge status: Home / Self Care | Payer: 59 | Source: Ambulatory Visit | Attending: Oncology | Admitting: Oncology

## 2012-02-16 ENCOUNTER — Encounter (HOSPITAL_COMMUNITY): Payer: Self-pay

## 2012-02-16 DIAGNOSIS — C50919 Malignant neoplasm of unspecified site of unspecified female breast: Secondary | ICD-10-CM

## 2012-02-16 DIAGNOSIS — N281 Cyst of kidney, acquired: Secondary | ICD-10-CM | POA: Insufficient documentation

## 2012-02-16 DIAGNOSIS — J984 Other disorders of lung: Secondary | ICD-10-CM | POA: Insufficient documentation

## 2012-02-16 DIAGNOSIS — R229 Localized swelling, mass and lump, unspecified: Secondary | ICD-10-CM | POA: Insufficient documentation

## 2012-02-16 DIAGNOSIS — K5730 Diverticulosis of large intestine without perforation or abscess without bleeding: Secondary | ICD-10-CM | POA: Insufficient documentation

## 2012-02-16 DIAGNOSIS — Z90710 Acquired absence of both cervix and uterus: Secondary | ICD-10-CM | POA: Insufficient documentation

## 2012-02-16 DIAGNOSIS — M47817 Spondylosis without myelopathy or radiculopathy, lumbosacral region: Secondary | ICD-10-CM | POA: Insufficient documentation

## 2012-02-16 MED ORDER — IOHEXOL 300 MG/ML  SOLN
100.0000 mL | Freq: Once | INTRAMUSCULAR | Status: AC | PRN
  Administered 2012-02-16: 100 mL via INTRAVENOUS

## 2012-02-28 ENCOUNTER — Encounter: Payer: Self-pay | Admitting: Oncology

## 2012-02-28 ENCOUNTER — Other Ambulatory Visit: Payer: Self-pay | Admitting: Oncology

## 2012-03-04 ENCOUNTER — Other Ambulatory Visit (INDEPENDENT_AMBULATORY_CARE_PROVIDER_SITE_OTHER): Payer: 59

## 2012-03-04 DIAGNOSIS — R7309 Other abnormal glucose: Secondary | ICD-10-CM

## 2012-03-04 DIAGNOSIS — E785 Hyperlipidemia, unspecified: Secondary | ICD-10-CM

## 2012-03-04 LAB — LIPID PANEL: VLDL: 146 mg/dL — ABNORMAL HIGH (ref 0.0–40.0)

## 2012-03-04 LAB — LDL CHOLESTEROL, DIRECT: Direct LDL: 86.5 mg/dL

## 2012-03-04 LAB — HEMOGLOBIN A1C: Hgb A1c MFr Bld: 6.1 % (ref 4.6–6.5)

## 2012-03-05 ENCOUNTER — Encounter: Payer: Self-pay | Admitting: Internal Medicine

## 2012-03-29 ENCOUNTER — Other Ambulatory Visit: Payer: 59 | Admitting: Lab

## 2012-04-05 ENCOUNTER — Ambulatory Visit: Payer: 59 | Admitting: Oncology

## 2012-08-04 ENCOUNTER — Other Ambulatory Visit: Payer: Self-pay | Admitting: *Deleted

## 2012-08-04 DIAGNOSIS — C50919 Malignant neoplasm of unspecified site of unspecified female breast: Secondary | ICD-10-CM

## 2012-08-05 ENCOUNTER — Other Ambulatory Visit (HOSPITAL_BASED_OUTPATIENT_CLINIC_OR_DEPARTMENT_OTHER): Payer: 59 | Admitting: Lab

## 2012-08-05 DIAGNOSIS — C50919 Malignant neoplasm of unspecified site of unspecified female breast: Secondary | ICD-10-CM

## 2012-08-05 LAB — COMPREHENSIVE METABOLIC PANEL (CC13)
Alkaline Phosphatase: 55 U/L (ref 40–150)
BUN: 13.2 mg/dL (ref 7.0–26.0)
Creatinine: 1 mg/dL (ref 0.6–1.1)
Glucose: 128 mg/dl — ABNORMAL HIGH (ref 70–99)
Sodium: 139 mEq/L (ref 136–145)
Total Bilirubin: 0.54 mg/dL (ref 0.20–1.20)

## 2012-08-05 LAB — CBC WITH DIFFERENTIAL/PLATELET
BASO%: 0.7 % (ref 0.0–2.0)
Eosinophils Absolute: 0.2 10*3/uL (ref 0.0–0.5)
LYMPH%: 32 % (ref 14.0–49.7)
MONO#: 0.5 10*3/uL (ref 0.1–0.9)
NEUT#: 4.4 10*3/uL (ref 1.5–6.5)
Platelets: 224 10*3/uL (ref 145–400)
RBC: 4.45 10*6/uL (ref 3.70–5.45)
WBC: 7.5 10*3/uL (ref 3.9–10.3)
lymph#: 2.4 10*3/uL (ref 0.9–3.3)

## 2012-08-12 ENCOUNTER — Encounter: Payer: Self-pay | Admitting: Physician Assistant

## 2012-08-12 ENCOUNTER — Ambulatory Visit (HOSPITAL_BASED_OUTPATIENT_CLINIC_OR_DEPARTMENT_OTHER): Payer: 59 | Admitting: Physician Assistant

## 2012-08-12 ENCOUNTER — Telehealth: Payer: Self-pay | Admitting: *Deleted

## 2012-08-12 VITALS — BP 152/80 | HR 77 | Temp 98.0°F | Resp 20 | Ht 66.0 in | Wt 189.1 lb

## 2012-08-12 DIAGNOSIS — C50912 Malignant neoplasm of unspecified site of left female breast: Secondary | ICD-10-CM

## 2012-08-12 DIAGNOSIS — Z780 Asymptomatic menopausal state: Secondary | ICD-10-CM

## 2012-08-12 DIAGNOSIS — Z853 Personal history of malignant neoplasm of breast: Secondary | ICD-10-CM

## 2012-08-12 MED ORDER — LETROZOLE 2.5 MG PO TABS: 2.5000 mg | ORAL_TABLET | Freq: Every day | ORAL | Status: DC

## 2012-08-12 NOTE — Telephone Encounter (Signed)
gve appt for a lab and ov for 2015.  Made appts for mammo and bone density for 08/31/12.

## 2012-08-12 NOTE — Progress Notes (Signed)
ID: Carla Mccoy   DOB: 1950/08/17  MR#: [id number]  WUJ#:[id number]  PCP: Illene Regulus, MD GYN: Meredeth Ide MD SU: Harriette Bouillon MD OTHER MD: Laurette Schimke, Lina Sar, Max Fickle   HISTORY OF PRESENT ILLNESS: Carla Mccoy had routine yearly mammography April 12, 2009, showing a possible mass in the left breast.  Her breasts are dense which of course limits mammographic sensitivity.  She was recalled December 15th for additional views and Dr. Judyann Munson noted a density in the lateral aspect of the left breast.  There were no associated malignant type calcifications and it was not palpable by exam.  By ultrasound, there was sonographic positivity noted and biopsy was obtained the same day showing ([id number]) benign breast parenchyma on the left.  MRI of the breast was obtained on December 21st for further evaluation, and showed an irregular spiculated mass measuring 1.5 cm in the left breast, corresponding with the mammographic abnormality.  In the right breast, there was an area of clumped enhancement measuring up to 3.7 cm.  Bilateral ultrasounds were then performed December 28th. Again, there were no palpable masses in either breast.  In the right breast, there were multiple irregular hypoechoic areas but no discrete abnormality corresponding to the abnormal enhancement in the MRI.  In the left breast, there were three separate masses with multiple areas of ill-defined shadowing, but there were no definite findings correlating with the irregular area of enhancement seen in the recent MRI.    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  The prognostic profile showed the tumor to be not amplified for Her-2 with a ratio of 0.8.  ER was positive at 99%. PR was positive at 100%, and the MIB-1 was 13%.    With this information,  the patient was referred to Dr. Luisa Hart and after appropriate discussion, he proceeded to definitive surgery February 1st, 2011, the final pathology from that procedure ([id number]) showing a 1.2 cm invasive mammary carcinoma, primarily ductal with some lobular features, with negative margins and 0 of 2 sentinel lymph nodes involved.  The tumor was grade 1.  The closest margin was 3 mm posteriorly.  There was no evidence of lymphovascular invasion.    INTERVAL HISTORY: Carla Mccoy returns today for routine followup of her left breast carcinoma. She is tolerating her tamoxifen well, with the exception of some continued hot flashes and mild vaginal dryness. She does not find either these particularly problematic.  Since Carla Mccoy retired, she'll stay very active. She is exercising every day with her husband. She's doing a lot of yard work. Her energy level is very good.  REVIEW OF SYSTEMS: Carla Mccoy has had no recent illnesses and denies any fevers or chills. She's had no skin changes, abnormal bruising, or bleeding, and has had no evidence of abnormal clotting. Specifically, she denies any vaginal bleeding. She's had no peripheral swelling. She's eating and drinking well no nausea or change in bowel habits. She's had no cough, increased shortness of breath, or chest pain. She denies any abnormal headaches, dizziness, or change in vision. She also denies any unusual myalgias, arthralgias, or bony pain.  A detailed review of systems is otherwise noncontributory.  PAST MEDICAL HISTORY: Past Medical History  Diagnosis Date  . Tachy-brady syndrome   . Backache, unspecified   . Personal history of peptic ulcer disease   . Rheumatic chorea without mention of heart  involvement age 37    syndeham chorea  . Other and unspecified hyperlipidemia   . Other abnormal glucose   . Obesity, unspecified   . 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 Other chronic nonalcoholic liver disease   . Migraine  without aura, without mention of intractable migraine without mention of status migrainosus   . Unspecified hypothyroidism   . Unspecified essential hypertension   . breast ca dx'd 06/2009    breast cancer left - lumpectomy  1. Significant for hypothyroidism.   2. History of remote tonsillectomy.   3. History of remote tubal ligation.   4. History of simple hysterectomy secondary to fibroids in 1988.   5. History of exploratory laparotomy for a scar tissue due to her prior tubal ligation (which was done in the era pre-laparoscopy).  6. History of appendectomy.   7. History of remote biopsy of a left breast mass which was benign.   History of melanoma:  We have dermatopathology reports for June 01, 2006, showing [phone]) a superficial spreading melanoma with a Breslow measurement of 0.6 mm, Clark's level a thin 4 with no evidence of regression or ulceration.  No vascular invasion.  Margins were close and she had a deeper excision on February 15th showing no residual melanoma.  This was obtained from the left lateral upper breast.  The patient also had a dysplastic nevus removed from the left upper scapular skin area ([id number]).  The patient has a history of borderline hypertension, history of mild migraines, history of carpal tunnel syndrome and mild dyslipidemia.    PAST SURGICAL HISTORY: Past Surgical History  Procedure Laterality Date  . Laparoscopy abdomen diagnostic    . Tonsillectomy    . Tubal ligation    . Abdominal hysterectomy    . Breast lumpectomy      Feb '11  . Breast biopsy      in the 80's - benign tissue    FAMILY HISTORY Family History  Problem Relation Age of Onset  . Peripheral vascular disease Mother   . Coronary artery disease Mother   . Hyperlipidemia Mother   . Heart disease Mother     CAD/fatal MI  . Diabetes Mother   . Stroke Mother   . Diabetes Sister   . Hypothyroidism Sister   . Diabetes Brother   . Hyperlipidemia Brother   . Heart disease  Brother   The patient's father was murdered in a robbery at the age of 47.  The patient's mother died at the age of 27 from a myocardial infarction in the setting of diabetes.  The patient has five siblings.  There are no first-degree relatives with ovarian or breast cancer to her knowledge.  The patient's maternal grandmother was diagnosed with breast cancer at the age of 88 and died shortly thereafter.    GYNECOLOGIC HISTORY: She is GX P2.  First pregnancy to term at age 21.  The patient had her hysterectomy in 1988.  Approximately 10 years later, she started having menopausal symptoms and was treated with Premarin which she tolerated poorly, then the Femring which was uncomfortable, and finally with an estrogen patch which was stopped with her breast cancer diagnosis.    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  Her husband, Carla Mccoy, is a retired Biomedical scientist. Their daughter Carla Mccoy works as a Engineer, civil (consulting) for [employer].  Son Carla Mccoy is a truck Hospital doctor.  The patient has  two grandchildren.  She attends the [employer].   ADVANCED DIRECTIVES:  HEALTH MAINTENANCE: History  Substance Use Topics  . Smoking status: Never Smoker   . Smokeless tobacco: Never Used  . Alcohol Use: No     Colonoscopy:  PAP:  Bone density:  Lipid panel:  Allergies  Allergen Reactions  . Codeine   . Sulfonamide Derivatives     Current Outpatient Prescriptions  Medication Sig Dispense Refill  . Cholecalciferol (VITAMIN D3) 2000 UNITS TABS Take by mouth.        [name] ketoconazole (NIZORAL) 2 % cream Apply topically daily. Apply to affected area twice daily until clear  15 g  0  . levothyroxine (SYNTHROID, LEVOTHROID) 175 MCG tablet TAKE ONE TABLET BY MOUTH EVERY DAY  90 tablet  3  . Multiple Vitamins-Minerals (MULTIVITAMIN,TX-MINERALS) tablet Take 1 tablet by mouth daily.        . tamoxifen (NOLVADEX) 20 MG tablet Take 1 tablet (20 mg total) by mouth  daily.  90 tablet  4  . vitamin E 100 UNIT capsule Take 100 Units by mouth daily.        [name] gabapentin (NEURONTIN) 300 MG capsule Take 1 capsule (300 mg total) by mouth 3 (three) times daily.  30 capsule  4   No current facility-administered medications for this visit.    OBJECTIVE: Middle-aged white woman who appears well Filed Vitals:   08/12/12 0921  BP: 152/80  Pulse: 77  Temp: 98 F (36.7 C)  Resp: 20     Body mass index is 30.54 kg/(m^2).    ECOG FS: 0 Filed Weights   08/12/12 0921  Weight: 189 lb 1.6 oz (85.775 kg)   Sclerae unicteric Oropharynx clear No cervical or supraclavicular adenopathy Lungs clear to auscultation bilaterally, no rales or rhonchi Heart regular rate and rhythm Abdomen soft, nontender to palpation, positive bowel sounds  MSK no focal spinal tenderness, no peripheral edema Neuro: nonfocal, well oriented with positive affect Breasts: The right breast is unremarkable. The left breast status post lumpectomy and radiation. There is hyperpigmentation as well as scar tissue surrounding the lumpectomy incision. There is no evidence of local recurrence. Axillae are benign bilaterally with no palpable adenopathy.   LAB RESULTS: Lab Results  Component Value Date   WBC 7.5 08/05/2012   NEUTROABS 4.4 08/05/2012   HGB 13.9 08/05/2012   HCT 40.8 08/05/2012   MCV 91.5 08/05/2012   PLT 224 08/05/2012      Chemistry      Component Value Date/Time   NA 139 08/05/2012 0824   NA 140 09/01/2011 1504   K 4.3 08/05/2012 0824   K 5.5* 09/01/2011 1504   CL 106 08/05/2012 0824   CL 102 09/01/2011 1504   CO2 24 08/05/2012 0824   CO2 28 09/01/2011 1504   BUN 13.2 08/05/2012 0824   BUN 13 09/01/2011 1504   CREATININE 1.0 08/05/2012 0824   CREATININE 0.8 09/01/2011 1504      Component Value Date/Time   CALCIUM 8.9 08/05/2012 0824   CALCIUM 9.8 09/01/2011 1504   ALKPHOS 55 08/05/2012 0824   ALKPHOS 54 09/01/2011 1504   ALKPHOS 54 09/01/2011 1504   AST 35* 08/05/2012 0824   AST 82* 09/01/2011 1504    AST 82* 09/01/2011 1504   ALT 21 08/05/2012 0824   ALT 40* 09/01/2011 1504   ALT 40* 09/01/2011 1504   BILITOT 0.54 08/05/2012 0824   BILITOT 0.7 09/01/2011 1504   BILITOT 0.7 09/01/2011 1504  Lab Results  Component Value Date   LABCA2 40* 02/05/2012     STUDIES: No results found.   ASSESSMENT: 62 y.o. Carla Mccoy woman   (1)  status post left lumpectomy and sentinel lymph node sampling February 2011 for a T1C N0 grade 1 ductal breast cancer with some lobular features, strongly estrogen and progesterone receptor positive, HER-2 negative, with an MIB-1 of 13%.    (2)  She completed radiation in April 2011    (3)  Started tamoxifen (after a very brief stint on Arimidex with very poor tolerance) July 2011  PLAN:  This case was reviewed with Dr. Darnelle Catalan who also spoke with the patient today. We discussed the fact that 2-3 years of tamoxifen, followed by an aromatase inhibitor is comparable to taking 10 years of tamoxifen with regards to benefit. Carla Mccoy would love to stop her antiestrogen therapy and 5 years, and is willing to try an aromatase inhibitor again, although she tolerated anastrozole very poorly the first time around.  Accordingly, she will take tamoxifen through the end of May. She will stay off of anti-estrogens for the month of June, and will initiate letrozole, 2.5 mg daily, beginning 11/02/2012. She'll give [name] a call 4-8 weeks later with an update regarding tolerance. If she is intolerant of the letrozole, we will likely go back to tamoxifen. At 5 years (July 2016) we will reevaluate whether to continue for an additional 5 years of therapy.  Carla Mccoy is due for both mammogram and bone density, both of which will be scheduled for later this month. We'll plan on seeing her back in one year, but can certainly see her sooner if necessary. She voices understanding and agreement with this plan, and will call with any problems.   Carla Mccoy    08/12/2012

## 2012-08-31 ENCOUNTER — Ambulatory Visit
Admission: RE | Admit: 2012-08-31 | Discharge: 2012-08-31 | Disposition: A | Discharge status: Home / Self Care | Payer: 59 | Source: Ambulatory Visit | Attending: Physician Assistant | Admitting: Physician Assistant

## 2012-08-31 DIAGNOSIS — Z780 Asymptomatic menopausal state: Secondary | ICD-10-CM

## 2012-08-31 DIAGNOSIS — Z853 Personal history of malignant neoplasm of breast: Secondary | ICD-10-CM

## 2012-08-31 DIAGNOSIS — C50912 Malignant neoplasm of unspecified site of left female breast: Secondary | ICD-10-CM

## 2012-11-09 ENCOUNTER — Other Ambulatory Visit: Payer: Self-pay | Admitting: Internal Medicine

## 2013-02-15 ENCOUNTER — Other Ambulatory Visit: Payer: Self-pay | Admitting: Internal Medicine

## 2013-03-22 ENCOUNTER — Ambulatory Visit (INDEPENDENT_AMBULATORY_CARE_PROVIDER_SITE_OTHER): Payer: 59 | Admitting: Internal Medicine

## 2013-03-22 ENCOUNTER — Encounter: Payer: Self-pay | Admitting: Internal Medicine

## 2013-03-22 ENCOUNTER — Telehealth (INDEPENDENT_AMBULATORY_CARE_PROVIDER_SITE_OTHER): Payer: Self-pay

## 2013-03-22 ENCOUNTER — Other Ambulatory Visit (INDEPENDENT_AMBULATORY_CARE_PROVIDER_SITE_OTHER): Payer: 59

## 2013-03-22 VITALS — BP 136/80 | HR 85 | Temp 98.2°F | Wt 192.0 lb

## 2013-03-22 DIAGNOSIS — E785 Hyperlipidemia, unspecified: Secondary | ICD-10-CM

## 2013-03-22 DIAGNOSIS — N611 Abscess of the breast and nipple: Secondary | ICD-10-CM

## 2013-03-22 DIAGNOSIS — N61 Mastitis without abscess: Secondary | ICD-10-CM

## 2013-03-22 DIAGNOSIS — E039 Hypothyroidism, unspecified: Secondary | ICD-10-CM

## 2013-03-22 DIAGNOSIS — C50912 Malignant neoplasm of unspecified site of left female breast: Secondary | ICD-10-CM

## 2013-03-22 DIAGNOSIS — I10 Essential (primary) hypertension: Secondary | ICD-10-CM

## 2013-03-22 DIAGNOSIS — C50919 Malignant neoplasm of unspecified site of unspecified female breast: Secondary | ICD-10-CM

## 2013-03-22 DIAGNOSIS — Z23 Encounter for immunization: Secondary | ICD-10-CM

## 2013-03-22 DIAGNOSIS — R7309 Other abnormal glucose: Secondary | ICD-10-CM

## 2013-03-22 LAB — CBC WITH DIFFERENTIAL/PLATELET
Eosinophils Relative: 1.8 % (ref 0.0–5.0)
HCT: 44 % (ref 36.0–46.0)
Hemoglobin: 15.2 g/dL — ABNORMAL HIGH (ref 12.0–15.0)
Lymphocytes Relative: 26.3 % (ref 12.0–46.0)
Lymphs Abs: 3 10*3/uL (ref 0.7–4.0)
Monocytes Relative: 7.3 % (ref 3.0–12.0)
Neutro Abs: 7.4 10*3/uL (ref 1.4–7.7)
WBC: 11.4 10*3/uL — ABNORMAL HIGH (ref 4.5–10.5)

## 2013-03-22 LAB — LIPID PANEL
Total CHOL/HDL Ratio: 5
VLDL: 40.2 mg/dL — ABNORMAL HIGH (ref 0.0–40.0)

## 2013-03-22 MED ORDER — SULFAMETHOXAZOLE-TMP DS 800-160 MG PO TABS: 1.0000 | ORAL_TABLET | Freq: Two times a day (BID) | ORAL | Status: DC

## 2013-03-22 NOTE — Progress Notes (Signed)
Pre visit review using our clinic review tool, if applicable. No additional management support is needed unless otherwise documented below in the visit note. 

## 2013-03-22 NOTE — Telephone Encounter (Signed)
[facility] called asking for this patient to be seen this week by Dr. Davina Poke for breast abscess. Urg ov offered but ask for appointment with Dr. Davina Poke instead. Please advise ? appt for wed.

## 2013-03-22 NOTE — Telephone Encounter (Signed)
Left appt info on patients VM. Scheduled tomorrow at 9:00am.

## 2013-03-22 NOTE — Progress Notes (Signed)
Subjective:    Patient ID: Carla Mccoy, female    DOB: 1950/08/31, 62 y.o.   MRN: [id number]  HPI Carla Mccoy presents for follow up after an interval of 18 months. She needs to have routine lab. She has follow with Dr. Darnelle Catalan - was started on Letrazole July 1, '14 but had diffuse body aches and stopped medication. She has notified Dr. Darnelle Catalan.  She also needs routine lab and thyroid follow-up.   She does report that she has had a lesion on the left breast with drainage from a breast lesion with yellow discharge and blood. This is the location of previous lumpectomy and surgery which has always be sore.   Past Medical History  Diagnosis Date  . Tachy-brady syndrome   . Backache, unspecified   . Personal history of peptic ulcer disease   . 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 Other and unspecified hyperlipidemia   . Other abnormal glucose   . Obesity, unspecified   . 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 Other chronic nonalcoholic liver disease   . Migraine without aura, without mention of intractable migraine without mention of status migrainosus   . Unspecified hypothyroidism   . Unspecified essential hypertension   . breast ca dx'd 06/2009    breast cancer left - lumpectomy   Past Surgical History  Procedure Laterality Date  . Laparoscopy abdomen diagnostic    . Tonsillectomy    . Tubal ligation    . Abdominal hysterectomy    . Breast lumpectomy      Feb '11  . Breast biopsy      in the 80's - benign tissue   Family History  Problem Relation Age of Onset  . Peripheral vascular disease Mother   . Coronary artery disease Mother   . Hyperlipidemia Mother   . Heart disease Mother     CAD/fatal MI  . Diabetes Mother   . Stroke Mother   . Diabetes Sister   . Hypothyroidism Sister   . Diabetes Brother   . Hyperlipidemia Brother   . Heart disease Brother    History   Social History  . Marital  Status: Married    Spouse Name: N/A    Number of Children: N/A  . Years of Education: 14   Occupational History  . Firefighter    Social History Main Topics  . Smoking status: Never Smoker   . Smokeless tobacco: Never Used  . Alcohol Use: No  . Drug Use: No  . Sexual Activity: Yes    Partners: Male   Other Topics Concern  . Not on file   Social History Narrative   HS[employer], [employer] - 3 yrs, no degree. Married -'25  1 son -'57, 1 daughter-'70; 2 grandchildren.  Work - Patent examiner for [employer].  Marriage is in good health. She enjoys her work.                          Current Outpatient Prescriptions on File Prior to Visit  Medication Sig Dispense Refill  . Cholecalciferol (VITAMIN D3) 2000 UNITS TABS Take by mouth.        [name] ketoconazole (NIZORAL) 2 % cream Apply topically daily. Apply to affected area twice daily until clear  15 g  0  . levothyroxine (SYNTHROID, LEVOTHROID) 175 MCG tablet TAKE ONE TABLET BY MOUTH ONCE DAILY  90 tablet  0  . Multiple Vitamins-Minerals (MULTIVITAMIN,TX-MINERALS) tablet Take 1 tablet by mouth daily.        . vitamin E 100 UNIT capsule Take 100 Units by mouth daily.         No current facility-administered medications on file prior to visit.      Review of Systems System review is negative for any constitutional, cardiac, pulmonary, GI or neuro symptoms or complaints other than as described in the HPI.     Objective:   Physical Exam Filed Vitals:   03/22/13 1041  BP: 136/80  Pulse: 85  Temp: 98.2 F (36.8 C)   Gen'l- heavyset woman in no distress Cor - RRR Pulm - normal respirations Derm - left breast with inverted nipple, large area of erythema around the areola involving greater than a third of the breast. Indurated area below the nipple at the 7 o'clock position with drainage of serosanguinous fluid and scant pus. The area is very tender.       Assessment & Plan:  Abscess left breast.  Plan Warm compresses 3-4  times a day  Septra DS bid x 10 days  Acute referral to [facility] - her breast surgeon is Dr. Luisa Hart - hopefully to be seen 11.19.14

## 2013-03-22 NOTE — Telephone Encounter (Signed)
900 wed have michellle add her there in 10 min spot

## 2013-03-22 NOTE — Patient Instructions (Signed)
Nice to see you again.  Breast abscess -  Plan Septra DS twice a day for 10 days  Warm compresses for 5-10 min three times a day  You will get a call from [facility] about an appointment - may not be with Dr. Luisa Hart - his schedule is full. They will try to work you in tomorrow  Lab today - CBC, thyroid functions.  Abscess An abscess is an infected area that contains a collection of pus and debris.It can occur in almost any part of the body. An abscess is also known as a furuncle or boil. CAUSES  An abscess occurs when tissue gets infected. This can occur from blockage of oil or sweat glands, infection of hair follicles, or a minor injury to the skin. As the body tries to fight the infection, pus collects in the area and creates pressure under the skin. This pressure causes pain. People with weakened immune systems have difficulty fighting infections and get certain abscesses more often.  SYMPTOMS Usually an abscess develops on the skin and becomes a painful mass that is red, warm, and tender. If the abscess forms under the skin, you may feel a moveable soft area under the skin. Some abscesses break open (rupture) on their own, but most will continue to get worse without care. The infection can spread deeper into the body and eventually into the bloodstream, causing you to feel ill.  DIAGNOSIS  Your caregiver will take your medical history and perform a physical exam. A sample of fluid may also be taken from the abscess to determine what is causing your infection. TREATMENT  Your caregiver may prescribe antibiotic medicines to fight the infection. However, taking antibiotics alone usually does not cure an abscess. Your caregiver may need to make a small cut (incision) in the abscess to drain the pus. In some cases, gauze is packed into the abscess to reduce pain and to continue draining the area. HOME CARE INSTRUCTIONS   Only take over-the-counter or prescription medicines for  pain, discomfort, or fever as directed by your caregiver.  If you were prescribed antibiotics, take them as directed. Finish them even if you start to feel better.  If gauze is used, follow your caregiver's directions for changing the gauze.  To avoid spreading the infection:  Keep your draining abscess covered with a bandage.  Wash your hands well.  Do not share personal care items, towels, or whirlpools with others.  Avoid skin contact with others.  Keep your skin and clothes clean around the abscess.  Keep all follow-up appointments as directed by your caregiver. SEEK MEDICAL CARE IF:   You have increased pain, swelling, redness, fluid drainage, or bleeding.  You have muscle aches, chills, or a general ill feeling.  You have a fever. MAKE SURE YOU:   Understand these instructions.  Will watch your condition.  Will get help right away if you are not doing well or get worse. Document Released: 01/29/2005 Document Revised: 10/21/2011 Document Reviewed: 07/04/2011 [facility] Patient Information 2014 [place], [place].

## 2013-03-23 ENCOUNTER — Ambulatory Visit (INDEPENDENT_AMBULATORY_CARE_PROVIDER_SITE_OTHER): Payer: 59 | Admitting: Surgery

## 2013-03-23 ENCOUNTER — Encounter (INDEPENDENT_AMBULATORY_CARE_PROVIDER_SITE_OTHER): Payer: Self-pay | Admitting: Surgery

## 2013-03-23 VITALS — BP 150/96 | HR 100 | Temp 98.9°F | Resp 15 | Ht 67.0 in | Wt 191.2 lb

## 2013-03-23 DIAGNOSIS — N611 Abscess of the breast and nipple: Secondary | ICD-10-CM

## 2013-03-23 DIAGNOSIS — N61 Mastitis without abscess: Secondary | ICD-10-CM

## 2013-03-23 NOTE — Progress Notes (Signed)
NAME: Carla Mccoy       DOB: [birth year]           DATE: 03/23/2013        MRN: [id number]  CC:   Chief Complaint  Patient presents with  . New Evaluation    eval left br abscess in incision    [name] is a 62 y.o.Carla Kitchenfemale  [name] had routine yearly mammography April 12, 2009, showing a possible mass in the left breast. Her breasts are dense which of course limits mammographic sensitivity. She was recalled December 15th for additional views and Dr. Judyann Munson noted a density in the lateral aspect of the left breast. There were no associated malignant type calcifications and it was not palpable by exam. By ultrasound, there was sonographic positivity noted and biopsy was obtained the same day showing ([id number]) benign breast parenchyma on the left. MRI of the breast was obtained on December 21st for further evaluation, and showed an irregular spiculated mass measuring 1.5 cm in the left breast, corresponding with the mammographic abnormality. In the right breast, there was an area of clumped enhancement measuring up to 3.7 cm. Bilateral ultrasounds were then performed December 28th. Again, there were no palpable masses in either breast. In the right breast, there were multiple irregular hypoechoic areas but no discrete abnormality corresponding to the abnormal enhancement in the MRI. In the left breast, there were three separate masses with multiple areas of ill-defined shadowing, but there were no definite findings correlating with the irregular area of enhancement seen in the recent MRI.  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 The prognostic profile showed the tumor to be not amplified for Her-2 with a ratio of 0.8. ER was positive at 99%. PR was positive at 100%, and the MIB-1 was 13%.  With this  information, the patient was referred to Dr. Luisa Hart and after appropriate discussion, he proceeded to definitive surgery February 1st, 2011, the final pathology from that procedure ([id number]) showing a 1.2 cm invasive mammary carcinoma, primarily ductal with some lobular features, with negative margins and 0 of 2 sentinel lymph nodes involved. The tumor was grade 1. The closest margin was 3 mm posteriorly. There was no evidence of lymphovascular invasion.  INTERVAL HISTORY:  Rabecka returns today due to swelling,  Pain,  Redness and drainage from left breast at lumpectomy bed.  Put on bactrim yesterday by Dr Arthur Holms.  She did get radiation therapy.She did not tolerate antiestrogen therapy. PFSH: She has had no significant changes since the last visit here.  ROS: There have been no significant changes since the last visit here  EXAM:  VS: BP 150/96  Pulse 100  Temp(Src) 98.9 F (37.2 C) (Temporal)  Resp 15  Ht 5\' 7"  (1.702 m)  Wt 191 lb 3.2 oz (86.728 kg)  BMI 29.94 kg/m2  General: The patient is alert, oriented, generally healthy appearing, NAD. Mood and affect are normal.  Breasts:  redness and swelling left breast at lumpectomy site. Drainage noted from old scar. Q-tip placed and quarter-inch packing placed in cavity. Well-drained. Nipple inverted. Right breast normal.  Lymphatics: She has no axillary or supraclavicular adenopathy on either side.  Extremities: Full ROM of the surgical side with no lymphedema noted.  Data Reviewed: Clinical Data: History of left breast cancer, status-post  lumpectomy in February 2011. This is the patient's first mammogram  of the left breast following surgery. She reports tenderness at  the lumpectomy site.  DIGITAL DIAGNOSTIC BILATERAL MAMMOGRAM WITH CAD  Comparison: With prior  Findings:  ACR Breast Density Category is 3: The breast tissue is  heterogeneously dense.  The parenchymal pattern of the right breast is stable. There is a   cylindrical biopsy clip in the upper central right breast at the  site of a prior benign MRI guided biopsy. No mass, distortion, or  suspicious microcalcification is identified in the right breast.  On the left, there are lumpectomy changes in the upper central left  breast anteriorly. There are benign peripheral rounded  calcifications at the lumpectomy site consistent with fat necrosis.  A ribbon-shaped biopsy clip is seen in the lower outer quadrant of  the right breast, at the site of the prior benign ultrasound  guided. There is slight skin thickening of the left breast. No  definite evidence of malignancy on the left.  Mammographic images were processed with CAD.  IMPRESSION:  Lumpectomy and radiation changes of the left breast. No evidence  of malignancy in either breast.  RECOMMENDATION:  Bilateral diagnostic mammogram in 1 year.   Impression: Left breast abscess  History of left breast cancer 2011 status post breast conservation therapy for stage I left breast cancer lobular ER/PR positive Plan: Remove packing on Saturday. Return to clinic Monday. Complete Bactrim.

## 2013-03-23 NOTE — Assessment & Plan Note (Signed)
Has been doing well. Follows with Dr. Darnelle Catalan and will be returning to him soon. Now with acute abscess - doubt inflammatory breast disease but will defer to surgery.

## 2013-03-23 NOTE — Assessment & Plan Note (Signed)
Lab Results  Component Value Date   TSH 0.12* 03/22/2013        FT4                        1.18                                                                03/22/2013  Plan Continue present dose of replacement

## 2013-03-23 NOTE — Assessment & Plan Note (Signed)
Lab Results  Component Value Date   HGBA1C 6.1 03/04/2012

## 2013-03-23 NOTE — Patient Instructions (Signed)
Remove packing Saturday and keep covered.  OK to shower.  Finnish antibiotics.  Return Monday.

## 2013-03-26 LAB — WOUND CULTURE: Gram Stain: NONE SEEN

## 2013-03-28 ENCOUNTER — Ambulatory Visit (INDEPENDENT_AMBULATORY_CARE_PROVIDER_SITE_OTHER): Payer: 59 | Admitting: Surgery

## 2013-03-28 ENCOUNTER — Encounter (INDEPENDENT_AMBULATORY_CARE_PROVIDER_SITE_OTHER): Payer: Self-pay | Admitting: Surgery

## 2013-03-28 VITALS — BP 122/70 | HR 68 | Temp 97.4°F | Resp 14 | Ht 67.0 in | Wt 191.6 lb

## 2013-03-28 DIAGNOSIS — N611 Abscess of the breast and nipple: Secondary | ICD-10-CM

## 2013-03-28 DIAGNOSIS — N61 Mastitis without abscess: Secondary | ICD-10-CM

## 2013-03-28 NOTE — Progress Notes (Signed)
NAME: Carla Mccoy       DOB: [birth year]           DATE: 03/28/2013        MRN: [id number]  CC:   Chief Complaint  Patient presents with  . Routine Post Op    breast abcsess check    Carla Mccoy is a 62 y.o.Marland Kitchenfemale  Reem had routine yearly mammography April 12, 2009, showing a possible mass in the left breast. Her breasts are dense which of course limits mammographic sensitivity. She was recalled December 15th for additional views and Dr. Judyann Munson noted a density in the lateral aspect of the left breast. There were no associated malignant type calcifications and it was not palpable by exam. By ultrasound, there was sonographic positivity noted and biopsy was obtained the same day showing ([id number]) benign breast parenchyma on the left. MRI of the breast was obtained on December 21st for further evaluation, and showed an irregular spiculated mass measuring 1.5 cm in the left breast, corresponding with the mammographic abnormality. In the right breast, there was an area of clumped enhancement measuring up to 3.7 cm. Bilateral ultrasounds were then performed December 28th. Again, there were no palpable masses in either breast. In the right breast, there were multiple irregular hypoechoic areas but no discrete abnormality corresponding to the abnormal enhancement in the MRI. In the left breast, there were three separate masses with multiple areas of ill-defined shadowing, but there were no definite findings correlating with the irregular area of enhancement seen in the recent MRI.  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 The prognostic profile showed the tumor to be not amplified for Her-2 with a ratio of 0.8. ER was positive at 99%. PR was positive at 100%, and the MIB-1 was 13%.  With this information, the  patient was referred to Dr. Luisa Hart and after appropriate discussion, he proceeded to definitive surgery February 1st, 2011, the final pathology from that procedure ([id number]) showing a 1.2 cm invasive mammary carcinoma, primarily ductal with some lobular features, with negative margins and 0 of 2 sentinel lymph nodes involved. The tumor was grade 1. The closest margin was 3 mm posteriorly. There was no evidence of lymphovascular invasion.  INTERVAL HISTORY:  Carla Mccoy returns today due to swelling,  Pain,  Redness and drainage from left breast at lumpectomy bed.  Put on bactrim yesterday by Dr Arthur Holms.  She did get radiation therapy.She did not tolerate antiestrogen therapy. PFSH: She has had no significant changes since the last visit here.  ROS: There have been no significant changes since the last visit here  EXAM:  VS: BP 122/70  Pulse 68  Temp(Src) 97.4 F (36.3 C) (Temporal)  Resp 14  Ht 5\' 7"  (1.702 m)  Wt 191 lb 9.6 oz (86.909 kg)  BMI 30.00 kg/m2  General: The patient is alert, oriented, generally healthy appearing, NAD. Mood and affect are normal.  Breasts: Less  redness and swelling left breast at lumpectomy site. Drainage noted from old scar. Q-tip placed and quarter-inch packing placed in cavity. Well-drained. Nipple inverted. Right breast normal.  Lymphatics: She has no axillary or supraclavicular adenopathy on either side.  Extremities: Full ROM of the surgical side with no lymphedema noted.  Data Reviewed: Clinical Data: History of left breast cancer, status-post  lumpectomy in February 2011. This is the patient's first mammogram  of the left breast following surgery. She reports tenderness at  the lumpectomy site.  DIGITAL DIAGNOSTIC BILATERAL MAMMOGRAM WITH CAD  Comparison: With prior  Findings:  ACR Breast Density Category is 3: The breast tissue is  heterogeneously dense.  The parenchymal pattern of the right breast is stable. There is a  cylindrical  biopsy clip in the upper central right breast at the  site of a prior benign MRI guided biopsy. No mass, distortion, or  suspicious microcalcification is identified in the right breast.  On the left, there are lumpectomy changes in the upper central left  breast anteriorly. There are benign peripheral rounded  calcifications at the lumpectomy site consistent with fat necrosis.  A ribbon-shaped biopsy clip is seen in the lower outer quadrant of  the right breast, at the site of the prior benign ultrasound  guided. There is slight skin thickening of the left breast. No  definite evidence of malignancy on the left.  Mammographic images were processed with CAD.  IMPRESSION:  Lumpectomy and radiation changes of the left breast. No evidence  of malignancy in either breast.  RECOMMENDATION:  Bilateral diagnostic mammogram in 1 year.   Impression: Left breast abscess Cultures show S. Aureus multiple sensitivities including bactrim looks better overall.  History of left breast cancer 2011 status post breast conservation therapy for stage I left breast cancer lobular ER/PR positive Plan: Remove packing on wed .  Complete Bactrim. Return next week

## 2013-03-28 NOTE — Patient Instructions (Signed)
Remove packing in 2 days

## 2013-04-08 ENCOUNTER — Ambulatory Visit (INDEPENDENT_AMBULATORY_CARE_PROVIDER_SITE_OTHER): Payer: 59 | Admitting: Surgery

## 2013-04-08 ENCOUNTER — Encounter (INDEPENDENT_AMBULATORY_CARE_PROVIDER_SITE_OTHER): Payer: Self-pay | Admitting: Surgery

## 2013-04-08 VITALS — BP 140/90 | HR 92 | Temp 98.9°F | Resp 14 | Ht 67.0 in | Wt 193.4 lb

## 2013-04-08 DIAGNOSIS — N611 Abscess of the breast and nipple: Secondary | ICD-10-CM

## 2013-04-08 DIAGNOSIS — N61 Mastitis without abscess: Secondary | ICD-10-CM

## 2013-04-08 MED ORDER — DOXYCYCLINE HYCLATE 50 MG PO CAPS: 100.0000 mg | ORAL_CAPSULE | Freq: Two times a day (BID) | ORAL | Status: DC

## 2013-04-08 NOTE — Patient Instructions (Signed)
Take antibiotics until gone.  Return 3 months or sooner if not improved.

## 2013-04-08 NOTE — Progress Notes (Signed)
NAME: Carla Mccoy       DOB: 1951/01/05           DATE: 04/08/2013        MRN: [id number]  CC:   Chief Complaint  Patient presents with  . Routine Post Op    rechk br abscess    Carla Mccoy is a 62 y.o.Marland Kitchenfemale  Carla Mccoy had routine yearly mammography April 12, 2009, showing a possible mass in the left breast. Her breasts are dense which of course limits mammographic sensitivity. She was recalled December 15th for additional views and Dr. Judyann Munson noted a density in the lateral aspect of the left breast. There were no associated malignant type calcifications and it was not palpable by exam. By ultrasound, there was sonographic positivity noted and biopsy was obtained the same day showing ([id number]) benign breast parenchyma on the left. MRI of the breast was obtained on December 21st for further evaluation, and showed an irregular spiculated mass measuring 1.5 cm in the left breast, corresponding with the mammographic abnormality. In the right breast, there was an area of clumped enhancement measuring up to 3.7 cm. Bilateral ultrasounds were then performed December 28th. Again, there were no palpable masses in either breast. In the right breast, there were multiple irregular hypoechoic areas but no discrete abnormality corresponding to the abnormal enhancement in the MRI. In the left breast, there were three separate masses with multiple areas of ill-defined shadowing, but there were no definite findings correlating with the irregular area of enhancement seen in the recent MRI.  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 The prognostic profile showed the tumor to be not amplified for Her-2 with a ratio of 0.8. ER was positive at 99%. PR was positive at 100%, and the MIB-1 was 13%.  With this information, the patient  was referred to Dr. Luisa Hart and after appropriate discussion, he proceeded to definitive surgery February 1st, 2011, the final pathology from that procedure ([id number]) showing a 1.2 cm invasive mammary carcinoma, primarily ductal with some lobular features, with negative margins and 0 of 2 sentinel lymph nodes involved. The tumor was grade 1. The closest margin was 3 mm posteriorly. There was no evidence of lymphovascular invasion.  INTERVAL HISTORY: doing better with less pain and redness.  Pt has no insurance at this point. No fever or chills. PFSH: She has had no significant changes since the last visit here.  ROS: There have been no significant changes since the last visit here  EXAM:  VS: BP 140/90  Pulse 92  Temp(Src) 98.9 F (37.2 C) (Temporal)  Resp 14  Ht 5\' 7"  (1.702 m)  Wt 193 lb 6.4 oz (87.726 kg)  BMI 30.28 kg/m2  General: The patient is alert, oriented, generally healthy appearing, NAD. Mood and affect are normal.  Breasts: Less  redness and swelling left breast at lumpectomy site. Drainage noted from old scar. Q-tip placed and smaller cavity. Well-drained. Nipple inverted. Right breast normal.  Lymphatics: She has no axillary or supraclavicular adenopathy on either side.  Extremities: Full ROM of the surgical side with no lymphedema noted.  Data Reviewed: Clinical Data: History of left breast cancer, status-post  lumpectomy in February 2011. This is the patient's first mammogram  of the left breast following surgery. She reports tenderness at  the lumpectomy site.  DIGITAL DIAGNOSTIC BILATERAL MAMMOGRAM WITH CAD  Comparison: With prior  Findings:  ACR Breast Density Category is 3: The breast tissue is  heterogeneously dense.  The parenchymal pattern of the right breast is stable. There is a  cylindrical biopsy clip in the upper central right breast at the  site of a prior benign MRI guided biopsy. No mass, distortion, or  suspicious microcalcification is  identified in the right breast.  On the left, there are lumpectomy changes in the upper central left  breast anteriorly. There are benign peripheral rounded  calcifications at the lumpectomy site consistent with fat necrosis.  A ribbon-shaped biopsy clip is seen in the lower outer quadrant of  the right breast, at the site of the prior benign ultrasound  guided. There is slight skin thickening of the left breast. No  definite evidence of malignancy on the left.  Mammographic images were processed with CAD.  IMPRESSION:  Lumpectomy and radiation changes of the left breast. No evidence  of malignancy in either breast.  RECOMMENDATION:  Bilateral diagnostic mammogram in 1 year.   Impression: Left breast abscess Improving.  Given radiation history will treat for two more weeks with doxycycline.  Return 3 months or sooner.   History of left breast cancer 2011 status post breast conservation therapy for stage I left breast cancer lobular ER/PR positive Plan: Return 3 months or sooner.  Doxycycline 100 mg po bid.

## 2013-05-17 ENCOUNTER — Other Ambulatory Visit: Payer: Self-pay | Admitting: Internal Medicine

## 2013-05-17 ENCOUNTER — Telehealth: Payer: Self-pay | Admitting: *Deleted

## 2013-05-17 DIAGNOSIS — E669 Obesity, unspecified: Secondary | ICD-10-CM

## 2013-05-17 DIAGNOSIS — G4730 Sleep apnea, unspecified: Secondary | ICD-10-CM

## 2013-05-17 NOTE — Telephone Encounter (Signed)
Notified pt of MD recommendation & ordered.  She will follow up in nothing transpires by the end of the week. She is also going to bring by her new insurance card with information.

## 2013-05-17 NOTE — Telephone Encounter (Signed)
Patient phoned concerned b/c her spouse thinks that she stops breathing in her sleep (sleep apnea).  Is requesting advice on whether she needs a sleep study or not, and just how MD would liek to proceed.  Last OV with PCP 03/22/13   CB# [phone]

## 2013-05-17 NOTE — Telephone Encounter (Signed)
Referral for home health overnight oximetry sent to [facility]. If this is positive will move forward with full sleep study.

## 2013-06-13 ENCOUNTER — Other Ambulatory Visit: Payer: Self-pay | Admitting: Internal Medicine

## 2013-06-13 ENCOUNTER — Telehealth: Payer: Self-pay

## 2013-06-13 DIAGNOSIS — G4734 Idiopathic sleep related nonobstructive alveolar hypoventilation: Secondary | ICD-10-CM

## 2013-06-13 NOTE — Telephone Encounter (Signed)
Message copied by Shelly Coss on Mon Jun 13, 2013  1:38 PM ------      Message from: Adella Hare E      Created: Mon Jun 13, 2013  1:28 PM       Overnight oximetry does reveal 29 events per hour where oxygen level drops. Will move ahead to schedule full sleep study for OSA and need for CPAP ------

## 2013-06-13 NOTE — Telephone Encounter (Signed)
Patient has been advised

## 2013-06-24 ENCOUNTER — Ambulatory Visit (INDEPENDENT_AMBULATORY_CARE_PROVIDER_SITE_OTHER): Payer: BC Managed Care – PPO | Admitting: Surgery

## 2013-06-24 ENCOUNTER — Encounter (INDEPENDENT_AMBULATORY_CARE_PROVIDER_SITE_OTHER): Payer: Self-pay | Admitting: Surgery

## 2013-06-24 VITALS — BP 138/76 | HR 88 | Temp 98.4°F | Resp 14 | Ht 67.0 in | Wt 189.2 lb

## 2013-06-24 DIAGNOSIS — N6012 Diffuse cystic mastopathy of left breast: Secondary | ICD-10-CM

## 2013-06-24 DIAGNOSIS — N6019 Diffuse cystic mastopathy of unspecified breast: Secondary | ICD-10-CM

## 2013-06-24 MED ORDER — DOXYCYCLINE HYCLATE 50 MG PO CAPS: 50.0000 mg | ORAL_CAPSULE | Freq: Two times a day (BID) | ORAL | Status: DC

## 2013-06-24 NOTE — Progress Notes (Signed)
NAME: Carla Mccoy       DOB: [birth year]           DATE: 06/24/2013        MRN: [id number]  CC:   Chief Complaint  Patient presents with  . Follow-up    LTFU 3 mo br abs recheck    Carla Mccoy is a 63 y.o.Marland Kitchenfemale  [place] had routine yearly mammography April 12, 2009, showing a possible mass in the left breast. Her breasts are dense which of course limits mammographic sensitivity. She was recalled December 15th for additional views and Dr. Miquel Dunn noted a density in the lateral aspect of the left breast. There were no associated malignant type calcifications and it was not palpable by exam. By ultrasound, there was sonographic positivity noted and biopsy was obtained the same day showing ([id number]) benign breast parenchyma on the left. MRI of the breast was obtained on December 21st for further evaluation, and showed an irregular spiculated mass measuring 1.5 cm in the left breast, corresponding with the mammographic abnormality. In the right breast, there was an area of clumped enhancement measuring up to 3.7 cm. Bilateral ultrasounds were then performed December 28th. Again, there were no palpable masses in either breast. In the right breast, there were multiple irregular hypoechoic areas but no discrete abnormality corresponding to the abnormal enhancement in the MRI. In the left breast, there were three separate masses with multiple areas of ill-defined shadowing, but there were no definite findings correlating with the irregular area of enhancement seen in the recent MRI.  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 The prognostic profile showed the tumor to be not amplified for Her-2 with a ratio of 0.8. ER was positive at 99%. PR was positive at 100%, and the MIB-1 was 13%.  With this information, the patient  was referred to Dr. Brantley Stage and after appropriate discussion, he proceeded to definitive surgery February 1st, 2011, the final pathology from that procedure ([id number]) showing a 1.2 cm invasive mammary carcinoma, primarily ductal with some lobular features, with negative margins and 0 of 2 sentinel lymph nodes involved. The tumor was grade 1. The closest margin was 3 mm posteriorly. There was no evidence of lymphovascular invasion.  INTERVAL HISTORY:  Patient returns for three-month checkup. She's still having some redness and discomfort with some intermittent drainage from the incision. Denies fever or chills. A  PFSH: She has had no significant changes since the last visit here.  ROS: There have been no significant changes since the last visit here  EXAM:  VS: BP 138/76  Pulse 88  Temp(Src) 98.4 F (36.9 C) (Oral)  Resp 14  Ht 5' 7" (1.702 m)  Wt 189 lb 3.2 oz (85.821 kg)  BMI 29.63 kg/m2  General: The patient is alert, oriented, generally healthy appearing, NAD. Mood and affect are normal.  Breasts: Mild redness and swelling left breast at lumpectomy site. Drainage noted from old scar. Q-tip placed and smaller cavity. Well-drained. Nipple inverted. Right breast normal.  Lymphatics: She has no axillary or supraclavicular adenopathy on either side.  Extremities: Full ROM of the surgical side with no lymphedema noted.  Data Reviewed: Clinical Data: History of left breast cancer, status-post  lumpectomy in February 2011. This is the patient's first mammogram  of the left breast following surgery. She reports tenderness at  the lumpectomy site.  DIGITAL DIAGNOSTIC BILATERAL MAMMOGRAM WITH CAD  Comparison: With prior  Findings:  ACR Breast Density Category is 3: The breast tissue is  heterogeneously dense.  The parenchymal pattern of the right breast is stable. There is a  cylindrical biopsy clip in the upper central right breast at the  site of a prior benign MRI guided  biopsy. No mass, distortion, or  suspicious microcalcification is identified in the right breast.  On the left, there are lumpectomy changes in the upper central left  breast anteriorly. There are benign peripheral rounded  calcifications at the lumpectomy site consistent with fat necrosis.  A ribbon-shaped biopsy clip is seen in the lower outer quadrant of  the right breast, at the site of the prior benign ultrasound  guided. There is slight skin thickening of the left breast. No  definite evidence of malignancy on the left.  Mammographic images were processed with CAD.  IMPRESSION:  Lumpectomy and radiation changes of the left breast. No evidence  of malignancy in either breast.  RECOMMENDATION:  Bilateral diagnostic mammogram in 1 year.   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  Doxycycline 50 mg po bid. Had discussion about the condition and the potential need for surgical debridement if antibiotics do not clear this up. We'll treat long term for the next 30 days with the hope this will clear up. If not, she may require debridement and open packing to get this to heal. Discussed the cosmetic issues with this as well. She will start antibiotics again and hopefully this will resolve. Given radiation therapy, this may be difficult to heal.

## 2013-06-24 NOTE — Patient Instructions (Signed)
WARM COMPRESSES AND WILL RESTART LOW DOSE ANTIBIOTICS FOR THE NEXT 30 DAYS.  MAY NEED DEBRIDEMENT IN OR.

## 2013-06-28 ENCOUNTER — Encounter: Payer: Self-pay | Admitting: Internal Medicine

## 2013-07-04 ENCOUNTER — Ambulatory Visit (INDEPENDENT_AMBULATORY_CARE_PROVIDER_SITE_OTHER): Payer: BC Managed Care – PPO | Admitting: Surgery

## 2013-07-04 ENCOUNTER — Encounter (INDEPENDENT_AMBULATORY_CARE_PROVIDER_SITE_OTHER): Payer: Self-pay | Admitting: Surgery

## 2013-07-04 VITALS — BP 122/80 | HR 79 | Temp 98.2°F | Resp 16 | Ht 67.0 in | Wt 188.0 lb

## 2013-07-04 DIAGNOSIS — N6019 Diffuse cystic mastopathy of unspecified breast: Secondary | ICD-10-CM

## 2013-07-04 DIAGNOSIS — N6012 Diffuse cystic mastopathy of left breast: Secondary | ICD-10-CM

## 2013-07-04 NOTE — Patient Instructions (Signed)
Return 3 weeks/

## 2013-07-04 NOTE — Progress Notes (Signed)
NAME: Carla Mccoy       DOB: Jan 28, 1951           DATE: 07/04/2013        MRN: [id number]  CC:   Chief Complaint  Patient presents with  . Breast Problem    Carla Mccoy is a 63 y.o.Marland Kitchenfemale  Carla Mccoy had routine yearly mammography April 12, 2009, showing a possible mass in the left breast. Her breasts are dense which of course limits mammographic sensitivity. She was recalled December 15th for additional views and Dr. Miquel Dunn noted a density in the lateral aspect of the left breast. There were no associated malignant type calcifications and it was not palpable by exam. By ultrasound, there was sonographic positivity noted and biopsy was obtained the same day showing ([id number]) benign breast parenchyma on the left. MRI of the breast was obtained on December 21st for further evaluation, and showed an irregular spiculated mass measuring 1.5 cm in the left breast, corresponding with the mammographic abnormality. In the right breast, there was an area of clumped enhancement measuring up to 3.7 cm. Bilateral ultrasounds were then performed December 28th. Again, there were no palpable masses in either breast. In the right breast, there were multiple irregular hypoechoic areas but no discrete abnormality corresponding to the abnormal enhancement in the MRI. In the left breast, there were three separate masses with multiple areas of ill-defined shadowing, but there were no definite findings correlating with the irregular area of enhancement seen in the recent MRI.  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 The prognostic profile showed the tumor to be not amplified for Her-2 with a ratio of 0.8. ER was positive at 99%. PR was positive at 100%, and the MIB-1 was 13%.  With this information, the patient was referred to Dr.  Brantley Stage and after appropriate discussion, he proceeded to definitive surgery February 1st, 2011, the final pathology from that procedure ([id number]) showing a 1.2 cm invasive mammary carcinoma, primarily ductal with some lobular features, with negative margins and 0 of 2 sentinel lymph nodes involved. The tumor was grade 1. The closest margin was 3 mm posteriorly. There was no evidence of lymphovascular invasion.  INTERVAL HISTORY:  Patient returns for three-month checkup. She's still having some redness and discomfort with some intermittent drainage from the incision. Denies fever or chills. A  PFSH: She has had no significant changes since the last visit here.  ROS: There have been no significant changes since the last visit here  EXAM:  VS: BP 122/80  Pulse 79  Temp(Src) 98.2 F (36.8 C) (Oral)  Resp 16  Ht 5' 7"  (1.702 m)  Wt 188 lb (85.276 kg)  BMI 29.44 kg/m2  General: The patient is alert, oriented, generally healthy appearing, NAD. Mood and affect are normal.  Breasts: Mild redness and swelling left breast at lumpectomy site. Drainage noted from old scar. Q-tip placed and smaller cavity. Well-drained. Nipple inverted. Right breast normal.  Lymphatics: She has no axillary or supraclavicular adenopathy on either side.  Extremities: Full ROM of the surgical side with no lymphedema noted.  Data Reviewed: Clinical Data: History of left breast cancer, status-post  lumpectomy in February 2011. This is the patient's first mammogram  of the left breast following surgery. She reports tenderness at  the lumpectomy site.  DIGITAL DIAGNOSTIC BILATERAL MAMMOGRAM WITH CAD  Comparison: With prior  Findings:  ACR Breast Density Category is 3: The breast tissue is  heterogeneously dense.  The parenchymal pattern of the right breast is stable. There is a  cylindrical biopsy clip in the upper central right breast at the  site of a prior benign MRI guided biopsy. No mass,  distortion, or  suspicious microcalcification is identified in the right breast.  On the left, there are lumpectomy changes in the upper central left  breast anteriorly. There are benign peripheral rounded  calcifications at the lumpectomy site consistent with fat necrosis.  A ribbon-shaped biopsy clip is seen in the lower outer quadrant of  the right breast, at the site of the prior benign ultrasound  guided. There is slight skin thickening of the left breast. No  definite evidence of malignancy on the left.  Mammographic images were processed with CAD.  IMPRESSION:  Lumpectomy and radiation changes of the left breast. No evidence  of malignancy in either breast.  RECOMMENDATION:  Bilateral diagnostic mammogram in 1 year.   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  Doxycycline 50 mg po bid. Had discussion about the condition and the potential need for surgical debridement if antibiotics do not clear this up. We'll treat long term for the next 30 days with the hope this will clear up. If not, she may require debridement and open packing to get this to heal. Discussed the cosmetic issues with this as well. She will start antibiotics again and hopefully this will resolve. Given radiation therapy, this may be difficult to heal.

## 2013-07-08 ENCOUNTER — Ambulatory Visit (HOSPITAL_BASED_OUTPATIENT_CLINIC_OR_DEPARTMENT_OTHER): Payer: BC Managed Care – PPO | Attending: Internal Medicine

## 2013-07-08 DIAGNOSIS — G4734 Idiopathic sleep related nonobstructive alveolar hypoventilation: Secondary | ICD-10-CM

## 2013-07-08 DIAGNOSIS — G478 Other sleep disorders: Secondary | ICD-10-CM | POA: Insufficient documentation

## 2013-07-11 ENCOUNTER — Other Ambulatory Visit: Payer: Self-pay | Admitting: Dermatology

## 2013-07-14 ENCOUNTER — Encounter (HOSPITAL_BASED_OUTPATIENT_CLINIC_OR_DEPARTMENT_OTHER): Payer: BC Managed Care – PPO

## 2013-07-18 ENCOUNTER — Telehealth: Payer: Self-pay | Admitting: *Deleted

## 2013-07-18 NOTE — Telephone Encounter (Signed)
Results not posted yet

## 2013-07-18 NOTE — Telephone Encounter (Signed)
Pt called requesting Sleep Study Results.  Please advise

## 2013-07-19 NOTE — Telephone Encounter (Signed)
Spoke with pt advised of MDs message 

## 2013-07-26 DIAGNOSIS — R0902 Hypoxemia: Secondary | ICD-10-CM

## 2013-07-26 NOTE — Sleep Study (Signed)
   NAME: Carla Mccoy DATE OF BIRTH:  Nov 07, 1950 MEDICAL RECORD NUMBER [id number]  LOCATION: [facility]  PHYSICIAN: Kathee Delton  DATE OF STUDY: 07/08/2013  SLEEP STUDY TYPE: Nocturnal Polysomnogram               REFERRING PHYSICIAN: Norins, Heinz Knuckles, MD  INDICATION FOR STUDY: Hypersomnia with sleep apnea  EPWORTH SLEEPINESS SCORE:  9 HEIGHT:    WEIGHT:      There is no weight on file to calculate BMI.  NECK SIZE: 16 in.  MEDICATIONS: Reviewed in sleep record  SLEEP ARCHITECTURE: The patient had a total sleep time of 283 minutes with very little slow-wave sleep and only 42 minutes of REM. Sleep onset latency was normal at 19 minutes, and REM onset was slightly prolonged at 137 minutes. Sleep efficiency was moderately reduced at 75%.  RESPIRATORY DATA: The patient had 7 apneas and 72 obstructive hypopneas, giving her an AHI of 17 events per hour. The events occurred more commonly during REM and in the supine position, and there was loud snoring noted throughout.  OXYGEN DATA: There was oxygen desaturation as low as 81% with the patient's obstructive events  CARDIAC DATA: PVCs noted intermittently during the night.  MOVEMENT/PARASOMNIA: The patient had no significant limb movements or other abnormal behaviors seen.  IMPRESSION/ RECOMMENDATION:    1)  mild to moderate obstructive sleep apnea, with an AHI of 17 events per hour, and oxygen desaturation as low as 81%. Treatment for this degree of sleep apnea can include a trial of weight loss alone, upper airway surgery, dental appliance, and also CPAP. Clinical correlation is suggested, based on the impact to the patient's quality of life.  2) PVCs noted intermittently during the night, but no clinically significant arrhythmias were seen    [place], American Board of Sleep Medicine  ELECTRONICALLY SIGNED ON:  07/26/2013, 9:01 AM [place] PH: [phone]    FX: [phone] [place]

## 2013-07-27 ENCOUNTER — Telehealth: Payer: Self-pay

## 2013-07-27 NOTE — Telephone Encounter (Signed)
Phone call from patient [phone] requesting results from the sleep study she has done on 07/08/13. Please advise. Thanks

## 2013-07-27 NOTE — Telephone Encounter (Signed)
1) mild to moderate obstructive sleep apnea, with an AHI of 17 events per hour, and oxygen desaturation as low as 81%. Treatment for this degree of sleep apnea can include a trial of weight loss alone, upper airway surgery, dental appliance, and also CPAP. Clinical correlation is suggested, based on the impact to the patient's quality of life.  If she is having a lot of trouble with being to sleepy we can refer to sleep medicine for consult

## 2013-07-28 NOTE — Telephone Encounter (Signed)
Patient has been advised and would like to try weight loss.

## 2013-08-01 ENCOUNTER — Encounter (INDEPENDENT_AMBULATORY_CARE_PROVIDER_SITE_OTHER): Payer: Self-pay | Admitting: Surgery

## 2013-08-01 ENCOUNTER — Telehealth: Payer: Self-pay

## 2013-08-01 ENCOUNTER — Ambulatory Visit (INDEPENDENT_AMBULATORY_CARE_PROVIDER_SITE_OTHER): Payer: BC Managed Care – PPO | Admitting: Surgery

## 2013-08-01 VITALS — BP 142/80 | HR 78 | Temp 97.7°F | Resp 16 | Ht 67.0 in | Wt 191.2 lb

## 2013-08-01 DIAGNOSIS — N6012 Diffuse cystic mastopathy of left breast: Secondary | ICD-10-CM

## 2013-08-01 DIAGNOSIS — N6019 Diffuse cystic mastopathy of unspecified breast: Secondary | ICD-10-CM

## 2013-08-01 NOTE — Telephone Encounter (Signed)
Patient came into the office per her 07/27/13 telephone note in which results were given. She would also like to try CPAP machine. She states her insurance will cover up to 70% toward a rental payment and $200 towards the equipment. She states she heard depending on the diagnosis insurance may cover more. I did let her know she needs to establish with a new primary doctor. Does this have to be done before order placed for for CPAP?

## 2013-08-01 NOTE — Patient Instructions (Signed)
Wound Debridement  Wound debridement is a procedure used to remove dead tissue and contaminated substances from a wound. A wound must be clean to heal. It also must get a good supply of blood. Anything that is stopping this must be taken out of the wound. This could be dead tissue, scar tissue, fluid buildup, or debris from outside of the body. Wounds that are not cleaned by debridement heal slowly or not at all. They can become infected. Any infection in the tissue can also spread to nearby areas or to other parts of the body through the blood. Wound debridement can be done through a surgical procedure or various other methods.  Debridement is sometimes done to get a sample of tissue from the wound. The tissue can be checked under a microscope or sent to a lab for testing.   LET YOUR CAREGIVER KNOW ABOUT:    Any allergies you have.   All medicines you are taking, including vitamins, steroids, herbs, eyedrops, and over-the-counter medicines and creams.    Previous problems you or members of your family have had with the use of anesthetics.    Any blood disorders you have had.    Previous surgeries you have had.    Other health problems you have.   RISKS AND COMPLICATIONS   Generally, wound debridement is a safe procedure. However, as with any medical procedure, complications can occur. Possible complications include:   Bleeding that does not stop.    Infection.    Damage to nerves, blood vessels, or healthy tissue inside the wound.    Pain.    Lack of healing.  BEFORE THE PROCEDURE    The caregiver will check the wound for signs of healing or infection. A measurement of the wound will be taken, including how deep it is. A metal tool (probe) may be used. Blood tests may be done to check for infection.    If you will be given medicine to make you sleep through the procedure (general anesthetic), do not eat or drink anything for at least 6 hours before the procedure. Ask your caregiver if it is  okay to have a sip of water with any needed medicine.    Make plans to have someone drive you home after the procedure. Also, make sure someone can stay with you for a few days.   PROCEDURE   The following methods may be used alone or in combination.  Surgical debridement:   Small monitors may be placed on your body. They are used to check your heart, blood pressure, and oxygen level.    You may be given medicine through an intravenous (IV) access tube in your hand or arm.    You might be given medicine to help you relax (sedative).    You may be given medicine to numb the area around the wound (local anesthetic). If the wound is deep or wide, you may be given general anesthetic to make you sleep through the procedure.    Once you are asleep or the wound area is numb, the wound may be washed with a sterile saltwater solution.    Scissors, surgical knives (scalpels), and surgical tweezers (forceps) will be used to remove dead or dying tissue. Any other material that should not be in the wound will also be taken out.    After the tissue and other material have been removed from the wound, the wound will be washed again.    A bandage (dressing) may be placed over   the wound.   Mechanical debridement:  Mechanical debridement may involve various techniques:   A dressing may be used to pull off dead tissue. A moist dressing is placed over the wound. It is left in place until it is dry. When the dressing is lifted off, this lifts away the dead tissue.    Whirlpool baths may be used to flush the wound with forceful streams of hot water.    The wound may be flushed with sterile solution.   Surgical instruments that use water under high pressure may be used to clean the wound.  Chemical debridement:   A chemical medicine is put on the wound. The aim is to dissolve dead or dying tissue. Ointments may also be used.  Autolytic debridement:   A special dressing is used to trap moisture inside the  wound. The goal is for the wound to heal naturally under the dressing. Healing takes longer with this treatment.  AFTER THE PROCEDURE    If a local anesthetic is used, you will be allowed to go home as soon as you are ready. If a general anesthetic is used, you will be taken to a recovery area until you are stable. Your blood pressure and pulse will be checked often. You may continue to get fluids through the IV tube for a while. Once you are stable, you may be able to go home, or you may need to stay in the hospital overnight.Your caregiver will decide when you can go home.    You may feel some pain. You will likely be given medicine for pain.   Before you go home, make sure you know how to care for the wound. This includes knowing when the dressing should be changed and how to change it.    Set up a follow-up appointment before leaving.  Document Released: 07/16/2009 Document Revised: 04/07/2012 Document Reviewed: 01/07/2012  [employer] Patient Information 2014 [employer]

## 2013-08-01 NOTE — Progress Notes (Signed)
NAME: Carla Mccoy       DOB: 26-Jul-1950           DATE: 08/01/2013        MRN: [id number]  CC:   Chief Complaint  Patient presents with  . Breast Cancer Long Term Follow Up    Carla Mccoy is a 63 y.o.Marland Kitchenfemale  Aeva had routine yearly mammography April 12, 2009, showing a possible mass in the left breast. Her breasts are dense which of course limits mammographic sensitivity. She was recalled December 15th for additional views and Dr. Miquel Dunn noted a density in the lateral aspect of the left breast. There were no associated malignant type calcifications and it was not palpable by exam. By ultrasound, there was sonographic positivity noted and biopsy was obtained the same day showing ([id number]) benign breast parenchyma on the left. MRI of the breast was obtained on December 21st for further evaluation, and showed an irregular spiculated mass measuring 1.5 cm in the left breast, corresponding with the mammographic abnormality. In the right breast, there was an area of clumped enhancement measuring up to 3.7 cm. Bilateral ultrasounds were then performed December 28th. Again, there were no palpable masses in either breast. In the right breast, there were multiple irregular hypoechoic areas but no discrete abnormality corresponding to the abnormal enhancement in the MRI. In the left breast, there were three separate masses with multiple areas of ill-defined shadowing, but there were no definite findings correlating with the irregular area of enhancement seen in the recent MRI.  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 The prognostic profile showed the tumor to be not amplified for Her-2 with a ratio of 0.8. ER was positive at 99%. PR was positive at 100%, and the MIB-1 was 13%.  With this information, the patient was  referred to Dr. Brantley Stage and after appropriate discussion, he proceeded to definitive surgery February 1st, 2011, the final pathology from that procedure ([id number]) showing a 1.2 cm invasive mammary carcinoma, primarily ductal with some lobular features, with negative margins and 0 of 2 sentinel lymph nodes involved. The tumor was grade 1. The closest margin was 3 mm posteriorly. There was no evidence of lymphovascular invasion.  INTERVAL HISTORY:  Patient returns for three-month checkup. She's still having some redness and discomfort with some intermittent drainage from the incision. Denies fever or chills. A  PFSH: She has had no significant changes since the last visit here.  ROS: There have been no significant changes since the last visit here  EXAM:  VS: BP 142/80  Pulse 78  Temp(Src) 97.7 F (36.5 C) (Oral)  Resp 16  Ht 5' 7" (1.702 m)  Wt 191 lb 3.2 oz (86.728 kg)  BMI 29.94 kg/m2  General: The patient is alert, oriented, generally healthy appearing, NAD. Mood and affect are normal.  Breasts: Mild redness and swelling left breast at lumpectomy site. Drainage noted from old scar. Q-tip placed and smaller cavity. Well-drained. Nipple inverted. Right breast normal.  Lymphatics: She has no axillary or supraclavicular adenopathy on either side.  Extremities: Full ROM of the surgical side with no lymphedema noted.  Data Reviewed: Clinical Data: History of left breast cancer, status-post  lumpectomy in February 2011. This is the patient's first mammogram  of the left breast following surgery. She reports tenderness at  the lumpectomy site.  DIGITAL DIAGNOSTIC BILATERAL MAMMOGRAM  WITH CAD  Comparison: With prior  Findings:  ACR Breast Density Category is 3: The breast tissue is  heterogeneously dense.  The parenchymal pattern of the right breast is stable. There is a  cylindrical biopsy clip in the upper central right breast at the  site of a prior benign MRI guided  biopsy. No mass, distortion, or  suspicious microcalcification is identified in the right breast.  On the left, there are lumpectomy changes in the upper central left  breast anteriorly. There are benign peripheral rounded  calcifications at the lumpectomy site consistent with fat necrosis.  A ribbon-shaped biopsy clip is seen in the lower outer quadrant of  the right breast, at the site of the prior benign ultrasound  guided. There is slight skin thickening of the left breast. No  definite evidence of malignancy on the left.  Mammographic images were processed with CAD.  IMPRESSION:  Lumpectomy and radiation changes of the left breast. No evidence  of malignancy in either breast.  RECOMMENDATION:  Bilateral diagnostic mammogram in 1 year.   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  Recommend debridement left breast. The procedure has been discussed with the patient.  Alternative therapies have been discussed with the patient.  Operative risks include bleeding,  Infection,  Organ injury,  Nerve injury,  Blood vessel injury,  DVT,  Pulmonary embolism,  Death,  And possible reoperation.  Medical management risks include worsening of present situation.  The success of the procedure is 50 -90 % at treating patients symptoms.  The patient understands and agrees to proceed.

## 2013-08-02 ENCOUNTER — Telehealth: Payer: Self-pay | Admitting: Oncology

## 2013-08-02 NOTE — Telephone Encounter (Signed)
I called and spoke to patient and let her know Dr Ronnald Ramp does not manage CPAP. She is to go back to sleep medicine

## 2013-08-02 NOTE — Telephone Encounter (Signed)
I don't manage CPAP, I will ask her to see sleep medicine

## 2013-08-02 NOTE — Telephone Encounter (Signed)
april appts cxd per 3/31 pof. s/w pt re appts for 8/12 and 8/19. pt forwarded to desk nurse to leave message re surgery in may.

## 2013-08-05 ENCOUNTER — Telehealth: Payer: Self-pay | Admitting: *Deleted

## 2013-08-05 NOTE — Telephone Encounter (Signed)
Patient returning call after appt was reshceduled to let us know of debridement scheduled for 09/14/13. Patient not on any active treatment at this time, stopped taking Tamoxifen in Sept 2014. She states the pain and other side effects were affecting her daily living too much. Instructed patient to call us if she has any questions or concerns before her follow up.

## 2013-08-08 ENCOUNTER — Other Ambulatory Visit: Payer: 59

## 2013-08-09 ENCOUNTER — Telehealth: Payer: Self-pay | Admitting: Internal Medicine

## 2013-08-09 DIAGNOSIS — G4730 Sleep apnea, unspecified: Secondary | ICD-10-CM

## 2013-08-09 NOTE — Telephone Encounter (Signed)
Patient needs an order for sleep medicine in order for her to have a study done with a CPAP machine. Previous [facility] patient. She will be re-establishing with Dr. Glori Bickers over at [facility] later this month but wants to go ahead and get this done as soon as she can.

## 2013-08-10 DIAGNOSIS — G4733 Obstructive sleep apnea (adult) (pediatric): Secondary | ICD-10-CM | POA: Insufficient documentation

## 2013-08-10 NOTE — Telephone Encounter (Signed)
I have forwarded this message to Dr. Glori Bickers

## 2013-08-10 NOTE — Telephone Encounter (Signed)
Referral done

## 2013-08-15 ENCOUNTER — Encounter: Payer: 59 | Admitting: Oncology

## 2013-08-26 ENCOUNTER — Ambulatory Visit (INDEPENDENT_AMBULATORY_CARE_PROVIDER_SITE_OTHER): Payer: BC Managed Care – PPO | Admitting: Family Medicine

## 2013-08-26 ENCOUNTER — Encounter: Payer: Self-pay | Admitting: Family Medicine

## 2013-08-26 VITALS — BP 122/78 | HR 68 | Temp 97.9°F | Ht 66.25 in | Wt 189.5 lb

## 2013-08-26 DIAGNOSIS — I10 Essential (primary) hypertension: Secondary | ICD-10-CM

## 2013-08-26 DIAGNOSIS — G4730 Sleep apnea, unspecified: Secondary | ICD-10-CM

## 2013-08-26 DIAGNOSIS — E039 Hypothyroidism, unspecified: Secondary | ICD-10-CM

## 2013-08-26 DIAGNOSIS — M6530 Trigger finger, unspecified finger: Secondary | ICD-10-CM | POA: Insufficient documentation

## 2013-08-26 DIAGNOSIS — H8110 Benign paroxysmal vertigo, unspecified ear: Secondary | ICD-10-CM | POA: Insufficient documentation

## 2013-08-26 NOTE — Patient Instructions (Signed)
See Dr Gwenette Greet about sleep apnea as planned  Good luck with your surgery  When things have calmed down-call here and make an appt with Dr Lorelei Pont - for hand and trigger finger  If dizziness returns/ ear pain worsens let me know (flonase nasal spray may help inner ear congestion from allergies) Follow up in the fall for annual exam with labs prior

## 2013-08-26 NOTE — Progress Notes (Signed)
Subjective:    Patient ID: Carla Mccoy, female    DOB: [birth year], 63 y.o.   MRN: [id number]  HPI Here to transfer care from Dr Linda Hedges after he retired   Last week had a bit of vertigo and ear pain -that is better  No abx right now   bp is stable today  Was on beta blocker in the past    (her bp was high when she was stressed) -not needing it now  No cp or palpitations or headaches or edema  No side effects to medicines  BP Readings from Last 3 Encounters:  08/26/13 122/78  08/01/13 142/80  07/04/13 122/80     Hx of fatty liver Lab Results  Component Value Date   ALT 21 08/05/2012   AST 35* 08/05/2012   ALKPHOS 55 08/05/2012   BILITOT 0.54 08/05/2012     Hypothyroid Lab Results  Component Value Date   TSH 0.12* 03/22/2013   T4 was ok  Dr Linda Hedges kept her on the same dose   Hx of prev breast ca with chronic mastitis of L breast after sx/radiation Has surgery planned for next month  Was on tamoxifen for 3 y and then Dr Jana Hakim changed it - made her miserable and fatigued and achey all over (? What it was)  She will f/u with him for Aug due to her upcoming surgery   Has sleep apnea mild to mod  In March  AHI of 17 events per hour , lowest desat was 81%  She has an upcoming appt with Dr Gwenette Greet 5/4  She would love to loose weight   More trouble with carpal tunnel on R hand  Acute on chronic  Also having trigger finger in 4th finger    Patient Active Problem List   Diagnosis Date Noted  . Sleep apnea 08/10/2013  . Chronic mastitis of left breast 07/04/2013  . Breast abscess 03/28/2013  . Left breast abscess 03/23/2013  . Breast cancer 02/10/2012  . Routine health maintenance 09/06/2011  . LEG CRAMPS 04/12/2009  . 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 BACK PAIN, CHRONIC 05/10/2007  . PEPTIC ULCER DISEASE, HX OF 05/10/2007  . HYSTERECTOMY, HX OF 05/10/2007  . TONSILLECTOMY, HX OF 05/10/2007  . HYPOTHYROIDISM 01/28/2007  . HYPERLIPIDEMIA NEC/NOS 01/28/2007  .  OBESITY NOS 01/28/2007  . COMMON MIGRAINE 01/28/2007  . HYPERTENSION 01/28/2007  . FATTY LIVER DISEASE 01/28/2007  . ABNORMAL GLUCOSE NEC 01/28/2007  . ANGIOEDEMA 01/28/2007  . CARPAL TUNNEL SYNDROME, HX OF 01/27/2007   Past Medical History  Diagnosis Date  . Tachy-brady syndrome   . Backache, unspecified   . Personal history of peptic ulcer disease   . 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 Other and unspecified hyperlipidemia   . Other abnormal glucose   . Obesity, unspecified   . 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 Other chronic nonalcoholic liver disease   . Migraine without aura, without mention of intractable migraine without mention of status migrainosus   . Unspecified hypothyroidism   . Unspecified essential hypertension   . breast ca dx'd 06/2009    breast cancer left - lumpectomy   Past Surgical History  Procedure Laterality Date  . Laparoscopy abdomen diagnostic    . Tonsillectomy    . Tubal ligation    . Abdominal hysterectomy    . Breast lumpectomy      Feb '11  .  Breast biopsy      in the 80's - benign tissue   History  Substance Use Topics  . Smoking status: Never Smoker   . Smokeless tobacco: Never Used  . Alcohol Use: No   Family History  Problem Relation Age of Onset  . Peripheral vascular disease Mother   . Coronary artery disease Mother   . Hyperlipidemia Mother   . Heart disease Mother     CAD/fatal MI  . Diabetes Mother   . Stroke Mother   . Diabetes Sister   . Hypothyroidism Sister   . Diabetes Brother   . Hyperlipidemia Brother   . Heart disease Brother    Allergies  Allergen Reactions  . Codeine   . Metoprolol     Lips swelled/ hands swelled and her skin turned red   . Sulfonamide Derivatives    Current Outpatient Prescriptions on File Prior to Visit  Medication Sig Dispense Refill  . Cholecalciferol (VITAMIN D3) 2000 UNITS TABS Take by mouth.        .  levothyroxine (SYNTHROID, LEVOTHROID) 175 MCG tablet TAKE ONE TABLET BY MOUTH ONCE DAILY  90 tablet  3  . Multiple Vitamins-Minerals (MULTIVITAMIN,TX-MINERALS) tablet Take 1 tablet by mouth daily.        . vitamin E 100 UNIT capsule Take 100 Units by mouth daily.         No current facility-administered medications on file prior to visit.    Review of Systems Review of Systems  Constitutional: Negative for fever, appetite change, fatigue and unexpected weight change.  Eyes: Negative for pain and visual disturbance.  Respiratory: Negative for cough and shortness of breath.   Cardiovascular: Negative for cp or palpitations    Gastrointestinal: Negative for nausea, diarrhea and constipation.  Genitourinary: Negative for urgency and frequency.  Skin: Negative for pallor or rash  pos for infx of breast with redness  MSK pos for R trigger finger 4th with wrist pain  Neurological: Negative for weakness, light-headedness, numbness and headaches.  Hematological: Negative for adenopathy. Does not bruise/bleed easily.  Psychiatric/Behavioral: Negative for dysphoric mood. The patient is not nervous/anxious.         Objective:   Physical Exam  Constitutional: She appears well-developed and well-nourished. No distress.  overwt and well app  HENT:  Head: Normocephalic and atraumatic.  Eyes: Conjunctivae and EOM are normal. Pupils are equal, round, and reactive to light.  Neck: Normal range of motion. Neck supple.  Cardiovascular: Normal rate and regular rhythm.   Pulmonary/Chest: Effort normal and breath sounds normal. No respiratory distress. She has no wheezes. She has no rales.  Musculoskeletal: She exhibits tenderness. She exhibits no edema.  R wrist is tender with neg tinel Mild trigger finger 4th R   Lymphadenopathy:    She has no cervical adenopathy.  Neurological: She is alert. She has normal reflexes.  Skin: Skin is warm and dry.  Psychiatric: She has a normal mood and affect.           Assessment & Plan:

## 2013-08-26 NOTE — Progress Notes (Signed)
Pre visit review using our clinic review tool, if applicable. No additional management support is needed unless otherwise documented below in the visit note. 

## 2013-08-27 NOTE — Assessment & Plan Note (Signed)
bp in fair control at this time  BP Readings from Last 1 Encounters:  08/26/13 122/78   No changes needed Disc lifstyle change with low sodium diet and exercise   No meds at this time (beta blocker in the past)

## 2013-08-27 NOTE — Assessment & Plan Note (Signed)
Rev last labs - tsh low but T4 nl so Dr Linda Hedges did not change dose  No changes  F/u / lab in the fall

## 2013-08-27 NOTE — Assessment & Plan Note (Signed)
Recent and now resolved  Nl exam  Will continue to watch  Poss allergy related

## 2013-08-27 NOTE — Assessment & Plan Note (Signed)
Pt not interested in tx now -will wait until after surgery for her mastitis She will f/u with Dr Lorelei Pont

## 2013-08-27 NOTE — Assessment & Plan Note (Signed)
Had mild to mod - rev sleep study F/u with Dr Gwenette Greet soon to disc tx Disc nature of this and reasons to treat

## 2013-08-29 ENCOUNTER — Telehealth: Payer: Self-pay | Admitting: Internal Medicine

## 2013-08-29 NOTE — Telephone Encounter (Signed)
Relevant patient education assigned to patient using Emmi. ° °

## 2013-09-05 ENCOUNTER — Encounter: Payer: Self-pay | Admitting: Pulmonary Disease

## 2013-09-05 ENCOUNTER — Ambulatory Visit (INDEPENDENT_AMBULATORY_CARE_PROVIDER_SITE_OTHER): Payer: BC Managed Care – PPO | Admitting: Pulmonary Disease

## 2013-09-05 VITALS — BP 130/74 | HR 72 | Temp 98.2°F | Ht 66.5 in | Wt 192.6 lb

## 2013-09-05 DIAGNOSIS — G4733 Obstructive sleep apnea (adult) (pediatric): Secondary | ICD-10-CM

## 2013-09-05 NOTE — Assessment & Plan Note (Addendum)
The patient has mild to moderate obstructive sleep apnea by her recent study, but is not overly symptomatic during the day or night, and it does not seem to bother her bed partner. Her degree of sleep apnea is really not a significant health issue for her, and therefore the decision to treat aggressively should hinge on its impact to her quality of life.  I have outlined a conservative path with a trial of weight loss alone and positional therapy, as well as a more aggressive path with a dental appliance or CPAP. After a long discussion, the patient would like to take some time to work on weight loss.

## 2013-09-05 NOTE — Patient Instructions (Signed)
Work on weight loss, but let me know if you decide to treat your sleep apnea more aggressively.  followup with me as needed.

## 2013-09-05 NOTE — Progress Notes (Signed)
Subjective:    Patient ID: Carla Mccoy, female    DOB: November 28, 1950, 63 y.o.   MRN: [id number]  HPI The patient is a 63 year old female who I've been asked to see for management of obstructive sleep apnea. She had an NPSG in March of this year, and was found to have mild to moderate OSA with an AHI of 17 events per hour.  The patient has been noted to have snoring, as well as witnessed apneas by her bed partner. She has 2-3 awakenings at night, but overall feels fairly rested in the mornings upon arising. She denies any inappropriate daytime sleepiness except when she is extremely quiet in the afternoons. She has no issues with sleepiness while watching TV or reading in the evening. She denies any sleepiness issues while driving. The patient's weight is up about 8 pounds over the last 2 years, and her Epworth score today is 9.   Sleep Questionnaire What time do you typically go to bed?( Between what hours) 11 pm 11 pm at 1009 on 09/05/13 by Lilli Few, CMA How long does it take you to fall asleep? 30 minutes 30 minutes at 1009 on 09/05/13 by Lilli Few, CMA How many times during the night do you wake up? 3 3 at 1009 on 09/05/13 by Lilli Few, CMA What time do you get out of bed to start your day? 0630 0630 at 1009 on 09/05/13 by Lilli Few, CMA Do you drive or operate heavy machinery in your occupation? No No at 1009 on 09/05/13 by Lilli Few, CMA How much has your weight changed (up or down) over the past two years? (In pounds) 8 lb (3.629 kg) 8 lb (3.629 kg) at 1009 on 09/05/13 by Lilli Few, CMA Have you ever had a sleep study before? Yes Yes at 1009 on 09/05/13 by Lilli Few, CMA If yes, location of study? [facility] at 1009 on 09/05/13 by Lilli Few, CMA If yes, date of study? March 2015 March 2015 at 1009 on 09/05/13 by Lilli Few, CMA Do you currently use CPAP?  No No at 1009 on 09/05/13 by Lilli Few, CMA Do you wear oxygen at any time? No No at 1009 on 09/05/13 by Lilli Few, CMA   Review of Systems  Constitutional: Negative for fever and unexpected weight change.  HENT: Positive for congestion and ear pain. Negative for dental problem, nosebleeds, postnasal drip, rhinorrhea, sinus pressure, sneezing, sore throat and trouble swallowing.   Eyes: Negative for redness and itching.  Respiratory: Positive for shortness of breath. Negative for cough, chest tightness and wheezing.   Cardiovascular: Negative for palpitations and leg swelling.  Gastrointestinal: Negative for nausea and vomiting.  Genitourinary: Negative for dysuria.  Musculoskeletal: Positive for joint swelling.  Skin: Negative for rash.  Neurological: Negative for headaches.  Hematological: Does not bruise/bleed easily.  Psychiatric/Behavioral: Negative for dysphoric mood. The patient is not nervous/anxious.        Objective:   Physical Exam Constitutional:  Overweight female, no acute distress  HENT:  Nares patent without discharge  Oropharynx without exudate, palate and uvula are mildly elongated.  Eyes:  Perrla, eomi, no scleral icterus  Neck:  No JVD, no TMG  Cardiovascular:  Normal rate, regular rhythm, no rubs or gallops.  No murmurs        Intact distal pulses  Pulmonary :  Normal breath sounds, no stridor or respiratory distress  No rales, rhonchi, or wheezing  Abdominal:  Soft, nondistended, bowel sounds present.  No tenderness noted.   Musculoskeletal:  No lower extremity edema noted.  Lymph Nodes:  No cervical lymphadenopathy noted  Skin:  No cyanosis noted  Neurologic:  Alert, appropriate, moves all 4 extremities without obvious deficit.         Assessment & Plan:

## 2013-09-12 ENCOUNTER — Encounter (HOSPITAL_BASED_OUTPATIENT_CLINIC_OR_DEPARTMENT_OTHER): Payer: Self-pay | Admitting: *Deleted

## 2013-09-12 DIAGNOSIS — I10 Essential (primary) hypertension: Secondary | ICD-10-CM

## 2013-09-12 HISTORY — DX: Essential (primary) hypertension: I10

## 2013-09-14 ENCOUNTER — Other Ambulatory Visit (INDEPENDENT_AMBULATORY_CARE_PROVIDER_SITE_OTHER): Payer: Self-pay

## 2013-09-14 ENCOUNTER — Ambulatory Visit (HOSPITAL_BASED_OUTPATIENT_CLINIC_OR_DEPARTMENT_OTHER): Payer: BC Managed Care – PPO | Admitting: Certified Registered"

## 2013-09-14 ENCOUNTER — Encounter (HOSPITAL_BASED_OUTPATIENT_CLINIC_OR_DEPARTMENT_OTHER): Payer: Self-pay | Admitting: Certified Registered"

## 2013-09-14 ENCOUNTER — Ambulatory Visit (HOSPITAL_BASED_OUTPATIENT_CLINIC_OR_DEPARTMENT_OTHER)
Admission: RE | Admit: 2013-09-14 | Discharge: 2013-09-14 | Disposition: A | Discharge status: Home / Self Care | Payer: BC Managed Care – PPO | Source: Ambulatory Visit | Attending: Surgery | Admitting: Surgery

## 2013-09-14 ENCOUNTER — Encounter (HOSPITAL_BASED_OUTPATIENT_CLINIC_OR_DEPARTMENT_OTHER)
Admission: RE | Disposition: A | Discharge status: Home / Self Care | Payer: Self-pay | Source: Ambulatory Visit | Attending: Surgery

## 2013-09-14 ENCOUNTER — Encounter (HOSPITAL_BASED_OUTPATIENT_CLINIC_OR_DEPARTMENT_OTHER): Payer: BC Managed Care – PPO | Admitting: Certified Registered"

## 2013-09-14 DIAGNOSIS — E785 Hyperlipidemia, unspecified: Secondary | ICD-10-CM

## 2013-09-14 DIAGNOSIS — R51 Headache: Secondary | ICD-10-CM | POA: Insufficient documentation

## 2013-09-14 DIAGNOSIS — K7689 Other specified diseases of liver: Secondary | ICD-10-CM

## 2013-09-14 DIAGNOSIS — G43009 Migraine without aura, not intractable, without status migrainosus: Secondary | ICD-10-CM

## 2013-09-14 DIAGNOSIS — S21009A Unspecified open wound of unspecified breast, initial encounter: Secondary | ICD-10-CM

## 2013-09-14 DIAGNOSIS — Z853 Personal history of malignant neoplasm of breast: Secondary | ICD-10-CM | POA: Insufficient documentation

## 2013-09-14 DIAGNOSIS — Z9079 Acquired absence of other genital organ(s): Secondary | ICD-10-CM

## 2013-09-14 DIAGNOSIS — I029 Rheumatic chorea without heart involvement: Secondary | ICD-10-CM

## 2013-09-14 DIAGNOSIS — Z9889 Other specified postprocedural states: Secondary | ICD-10-CM

## 2013-09-14 DIAGNOSIS — Z8669 Personal history of other diseases of the nervous system and sense organs: Secondary | ICD-10-CM

## 2013-09-14 DIAGNOSIS — Z8711 Personal history of peptic ulcer disease: Secondary | ICD-10-CM

## 2013-09-14 DIAGNOSIS — C50919 Malignant neoplasm of unspecified site of unspecified female breast: Secondary | ICD-10-CM

## 2013-09-14 DIAGNOSIS — N6012 Diffuse cystic mastopathy of left breast: Secondary | ICD-10-CM

## 2013-09-14 DIAGNOSIS — M549 Dorsalgia, unspecified: Secondary | ICD-10-CM

## 2013-09-14 DIAGNOSIS — R252 Cramp and spasm: Secondary | ICD-10-CM

## 2013-09-14 DIAGNOSIS — E669 Obesity, unspecified: Secondary | ICD-10-CM

## 2013-09-14 DIAGNOSIS — G4733 Obstructive sleep apnea (adult) (pediatric): Secondary | ICD-10-CM

## 2013-09-14 DIAGNOSIS — R7309 Other abnormal glucose: Secondary | ICD-10-CM

## 2013-09-14 DIAGNOSIS — I10 Essential (primary) hypertension: Secondary | ICD-10-CM

## 2013-09-14 DIAGNOSIS — T66XXXA Radiation sickness, unspecified, initial encounter: Secondary | ICD-10-CM

## 2013-09-14 DIAGNOSIS — M545 Low back pain, unspecified: Secondary | ICD-10-CM | POA: Insufficient documentation

## 2013-09-14 DIAGNOSIS — N611 Abscess of the breast and nipple: Secondary | ICD-10-CM

## 2013-09-14 DIAGNOSIS — N61 Mastitis without abscess: Secondary | ICD-10-CM | POA: Insufficient documentation

## 2013-09-14 DIAGNOSIS — N6019 Diffuse cystic mastopathy of unspecified breast: Secondary | ICD-10-CM

## 2013-09-14 DIAGNOSIS — Z9089 Acquired absence of other organs: Secondary | ICD-10-CM

## 2013-09-14 DIAGNOSIS — H8110 Benign paroxysmal vertigo, unspecified ear: Secondary | ICD-10-CM

## 2013-09-14 DIAGNOSIS — Z0000 Encounter for general adult medical examination without abnormal findings: Secondary | ICD-10-CM

## 2013-09-14 DIAGNOSIS — T783XXA Angioneurotic edema, initial encounter: Secondary | ICD-10-CM

## 2013-09-14 DIAGNOSIS — E039 Hypothyroidism, unspecified: Secondary | ICD-10-CM

## 2013-09-14 DIAGNOSIS — M6530 Trigger finger, unspecified finger: Secondary | ICD-10-CM

## 2013-09-14 HISTORY — PX: INCISION AND DRAINAGE OF WOUND: SHX1803

## 2013-09-14 LAB — POCT HEMOGLOBIN-HEMACUE: Hemoglobin: 15.1 g/dL — ABNORMAL HIGH (ref 12.0–15.0)

## 2013-09-14 SURGERY — IRRIGATION AND DEBRIDEMENT WOUND
Anesthesia: General | Site: Breast | Laterality: Left

## 2013-09-14 MED ORDER — CEFAZOLIN SODIUM-DEXTROSE 2-3 GM-% IV SOLR
INTRAVENOUS | Status: AC
  Filled 2013-09-14: qty 50

## 2013-09-14 MED ORDER — FENTANYL CITRATE 0.05 MG/ML IJ SOLN
INTRAMUSCULAR | Status: AC
  Filled 2013-09-14: qty 2

## 2013-09-14 MED ORDER — FENTANYL CITRATE 0.05 MG/ML IJ SOLN
INTRAMUSCULAR | Status: DC | PRN
  Administered 2013-09-14: 25 ug via INTRAVENOUS
  Administered 2013-09-14: 50 ug via INTRAVENOUS
  Administered 2013-09-14: 25 ug via INTRAVENOUS

## 2013-09-14 MED ORDER — CEFAZOLIN SODIUM-DEXTROSE 2-3 GM-% IV SOLR
2.0000 g | INTRAVENOUS | Status: AC
  Administered 2013-09-14: 2 g via INTRAVENOUS

## 2013-09-14 MED ORDER — LIDOCAINE HCL (CARDIAC) 20 MG/ML IV SOLN
INTRAVENOUS | Status: DC | PRN
  Administered 2013-09-14: 30 mg via INTRAVENOUS

## 2013-09-14 MED ORDER — CHLORHEXIDINE GLUCONATE 4 % EX LIQD: 1.0000 "application " | Freq: Once | CUTANEOUS | Status: DC

## 2013-09-14 MED ORDER — FENTANYL CITRATE 0.05 MG/ML IJ SOLN
INTRAMUSCULAR | Status: AC
  Filled 2013-09-14: qty 6

## 2013-09-14 MED ORDER — ONDANSETRON HCL 4 MG/2ML IJ SOLN
INTRAMUSCULAR | Status: DC | PRN
  Administered 2013-09-14: 4 mg via INTRAVENOUS

## 2013-09-14 MED ORDER — PROPOFOL 10 MG/ML IV BOLUS
INTRAVENOUS | Status: DC | PRN
  Administered 2013-09-14: 160 mg via INTRAVENOUS

## 2013-09-14 MED ORDER — BUPIVACAINE-EPINEPHRINE (PF) 0.25% -1:200000 IJ SOLN
INTRAMUSCULAR | Status: AC
  Filled 2013-09-14: qty 30

## 2013-09-14 MED ORDER — DEXAMETHASONE SODIUM PHOSPHATE 4 MG/ML IJ SOLN
INTRAMUSCULAR | Status: DC | PRN
  Administered 2013-09-14: 10 mg via INTRAVENOUS

## 2013-09-14 MED ORDER — BUPIVACAINE-EPINEPHRINE 0.25% -1:200000 IJ SOLN
INTRAMUSCULAR | Status: DC | PRN
  Administered 2013-09-14: 28 mL

## 2013-09-14 MED ORDER — FENTANYL CITRATE 0.05 MG/ML IJ SOLN
25.0000 ug | INTRAMUSCULAR | Status: DC | PRN
  Administered 2013-09-14 (×2): 25 ug via INTRAVENOUS
  Administered 2013-09-14 (×2): 50 ug via INTRAVENOUS

## 2013-09-14 MED ORDER — LACTATED RINGERS IV SOLN
INTRAVENOUS | Status: DC
  Administered 2013-09-14 (×2): via INTRAVENOUS

## 2013-09-14 MED ORDER — 0.9 % SODIUM CHLORIDE (POUR BTL) OPTIME
TOPICAL | Status: DC | PRN
  Administered 2013-09-14: 1000 mL

## 2013-09-14 MED ORDER — HYDROCODONE-ACETAMINOPHEN 5-325 MG PO TABS: 1.0000 | ORAL_TABLET | Freq: Four times a day (QID) | ORAL | Status: DC | PRN

## 2013-09-14 SURGICAL SUPPLY — 56 items
ADH SKN CLS APL DERMABOND .7 (GAUZE/BANDAGES/DRESSINGS)
APPLIER CLIP 9.375 MED OPEN (MISCELLANEOUS)
APR CLP MED 9.3 20 MLT OPN (MISCELLANEOUS)
BINDER BREAST LRG (GAUZE/BANDAGES/DRESSINGS) IMPLANT
BINDER BREAST MEDIUM (GAUZE/BANDAGES/DRESSINGS) IMPLANT
BINDER BREAST XLRG (GAUZE/BANDAGES/DRESSINGS) ×4 IMPLANT
BINDER BREAST XXLRG (GAUZE/BANDAGES/DRESSINGS) IMPLANT
BLADE 15 SAFETY STRL DISP (BLADE) ×8 IMPLANT
BNDG GAUZE ELAST 4 BULKY (GAUZE/BANDAGES/DRESSINGS) ×4 IMPLANT
CANISTER SUCT 1200ML W/VALVE (MISCELLANEOUS) ×4 IMPLANT
CHLORAPREP W/TINT 26ML (MISCELLANEOUS) IMPLANT
CLIP APPLIE 9.375 MED OPEN (MISCELLANEOUS) IMPLANT
CLIP TI WIDE RED SMALL 6 (CLIP) IMPLANT
COVER MAYO STAND STRL (DRAPES) ×4 IMPLANT
COVER TABLE BACK 60X90 (DRAPES) ×4 IMPLANT
DECANTER SPIKE VIAL GLASS SM (MISCELLANEOUS) ×4 IMPLANT
DERMABOND ADVANCED (GAUZE/BANDAGES/DRESSINGS)
DERMABOND ADVANCED .7 DNX12 (GAUZE/BANDAGES/DRESSINGS) IMPLANT
DEVICE DUBIN W/COMP PLATE 8390 (MISCELLANEOUS) IMPLANT
DRAPE LAPAROSCOPIC ABDOMINAL (DRAPES) IMPLANT
DRAPE PED LAPAROTOMY (DRAPES) ×4 IMPLANT
DRAPE UTILITY XL STRL (DRAPES) ×4 IMPLANT
DRSG PAD ABDOMINAL 8X10 ST (GAUZE/BANDAGES/DRESSINGS) ×4 IMPLANT
ELECT COATED BLADE 2.86 ST (ELECTRODE) ×4 IMPLANT
ELECT REM PT RETURN 9FT ADLT (ELECTROSURGICAL) ×4
ELECTRODE REM PT RTRN 9FT ADLT (ELECTROSURGICAL) ×2 IMPLANT
GLOVE BIO SURGEON STRL SZ 6.5 (GLOVE) ×3 IMPLANT
GLOVE BIO SURGEONS STRL SZ 6.5 (GLOVE) ×1
GLOVE BIOGEL PI IND STRL 7.0 (GLOVE) ×2 IMPLANT
GLOVE BIOGEL PI IND STRL 8 (GLOVE) ×2 IMPLANT
GLOVE BIOGEL PI INDICATOR 7.0 (GLOVE) ×2
GLOVE BIOGEL PI INDICATOR 8 (GLOVE) ×2
GLOVE ECLIPSE 8.0 STRL XLNG CF (GLOVE) ×4 IMPLANT
GLOVE EXAM NITRILE MD LF STRL (GLOVE) ×4 IMPLANT
GOWN STRL REUS W/ TWL LRG LVL3 (GOWN DISPOSABLE) ×4 IMPLANT
GOWN STRL REUS W/TWL LRG LVL3 (GOWN DISPOSABLE) ×6
KIT MARKER MARGIN INK (KITS) IMPLANT
NEEDLE HYPO 25X1 1.5 SAFETY (NEEDLE) ×4 IMPLANT
NS IRRIG 1000ML POUR BTL (IV SOLUTION) ×4 IMPLANT
PACK BASIN DAY SURGERY FS (CUSTOM PROCEDURE TRAY) ×4 IMPLANT
PENCIL BUTTON HOLSTER BLD 10FT (ELECTRODE) ×4 IMPLANT
SLEEVE SCD COMPRESS KNEE MED (MISCELLANEOUS) ×4 IMPLANT
SPONGE GAUZE 4X4 12PLY (GAUZE/BANDAGES/DRESSINGS) ×4 IMPLANT
SPONGE LAP 4X18 X RAY DECT (DISPOSABLE) ×4 IMPLANT
STAPLER VISISTAT 35W (STAPLE) IMPLANT
SUT MON AB 4-0 PC3 18 (SUTURE) IMPLANT
SUT SILK 2 0 SH (SUTURE) IMPLANT
SUT VIC AB 3-0 SH 27 (SUTURE) ×3
SUT VIC AB 3-0 SH 27X BRD (SUTURE) ×2 IMPLANT
SYR CONTROL 10ML LL (SYRINGE) ×4 IMPLANT
TOWEL OR 17X24 6PK STRL BLUE (TOWEL DISPOSABLE) ×8 IMPLANT
TOWEL OR NON WOVEN STRL DISP B (DISPOSABLE) ×4 IMPLANT
TRAY DSU PREP LF (CUSTOM PROCEDURE TRAY) ×4 IMPLANT
TUBE CONNECTING 20'X1/4 (TUBING) ×1
TUBE CONNECTING 20X1/4 (TUBING) ×3 IMPLANT
YANKAUER SUCT BULB TIP NO VENT (SUCTIONS) ×4 IMPLANT

## 2013-09-14 NOTE — Interval H&P Note (Signed)
History and Physical Interval Note:  09/14/2013 8:07 AM  Carla Mccoy  has presented today for surgery, with the diagnosis of mastitis Left breast  The various methods of treatment have been discussed with the patient and family. After consideration of risks, benefits and other options for treatment, the patient has consented to  Procedure(s):  DEBRIDEMENT LEFT BREAST ABSCESS (Left) as a surgical intervention .  The patient's history has been reviewed, patient examined, no change in status, stable for surgery.  I have reviewed the patient's chart and labs.  Questions were answered to the patient's satisfaction.     Calene Paradiso A. Verlie Liotta

## 2013-09-14 NOTE — Anesthesia Preprocedure Evaluation (Addendum)
Anesthesia Evaluation  Patient identified by MRN, date of birth, ID band Patient awake    Reviewed: Allergy & Precautions, NPO status   Airway Mallampati: II      Dental   Pulmonary sleep apnea ,  breath sounds clear to auscultation        Cardiovascular hypertension, Rate:Normal     Neuro/Psych  Headaches,    GI/Hepatic negative GI ROS, Neg liver ROS,   Endo/Other  Hypothyroidism   Renal/GU negative Renal ROS     Musculoskeletal   Abdominal   Peds  Hematology   Anesthesia Other Findings   Reproductive/Obstetrics                         Anesthesia Physical Anesthesia Plan  ASA: III  Anesthesia Plan: General   Post-op Pain Management:    Induction: Intravenous  Airway Management Planned: LMA  Additional Equipment:   Intra-op Plan:   Post-operative Plan: Extubation in OR  Informed Consent: I have reviewed the patients History and Physical, chart, labs and discussed the procedure including the risks, benefits and alternatives for the proposed anesthesia with the patient or authorized representative who has indicated his/her understanding and acceptance.   Dental advisory given  Plan Discussed with: CRNA, Anesthesiologist and Surgeon  Anesthesia Plan Comments:         Anesthesia Quick Evaluation

## 2013-09-14 NOTE — Brief Op Note (Signed)
09/14/2013  9:12 AM  PATIENT:  Carla Mccoy  63 y.o. female  PRE-OPERATIVE DIAGNOSIS:  mastitis Left breast  POST-OPERATIVE DIAGNOSIS:  mastitis Left breast  PROCEDURE:  Procedure(s):  DEBRIDEMENT LEFT BREAST ABSCESS (Left)  SURGEON:  Surgeon(s) and Role:    * Johana Hopkinson A. Shawnee Higham, MD - Primary      ANESTHESIA:   local and general  EBL:  Total I/O In: 1000 [I.V.:1000] Out: -       LOCAL MEDICATIONS USED:  MARCAINE     SPECIMEN:  Source of Specimen:  left breast tissue  DISPOSITION OF SPECIMEN:  PATHOLOGY  COUNTS:  YES  TOURNIQUET:  * No tourniquets in log *  DICTATION: .Other Dictation: Dictation Number [id number]  PLAN OF CARE: Discharge to home after PACU  PATIENT DISPOSITION:  PACU - hemodynamically stable.   Delay start of Pharmacological VTE agent (>24hrs) due to surgical blood loss or risk of bleeding: not applicable

## 2013-09-14 NOTE — Transfer of Care (Signed)
Immediate Anesthesia Transfer of Care Note  Patient: Carla Mccoy  Procedure(s) Performed: Procedure(s):  DEBRIDEMENT LEFT BREAST ABSCESS (Left)  Patient Location: PACU  Anesthesia Type:General  Level of Consciousness: awake and patient cooperative  Airway & Oxygen Therapy: Patient Spontanous Breathing and Patient connected to face mask oxygen  Post-op Assessment: Report given to PACU RN and Post -op Vital signs reviewed and stable  Post vital signs: Reviewed and stable  Complications: No apparent anesthesia complications

## 2013-09-14 NOTE — Anesthesia Procedure Notes (Signed)
Procedure Name: LMA Insertion Date/Time: 09/14/2013 8:31 AM Performed by: Katrell Milhorn Pre-anesthesia Checklist: Patient identified, Emergency Drugs available, Suction available and Patient being monitored Patient Re-evaluated:Patient Re-evaluated prior to inductionOxygen Delivery Method: Circle System Utilized Preoxygenation: Pre-oxygenation with 100% oxygen Intubation Type: IV induction Ventilation: Mask ventilation without difficulty LMA: LMA inserted LMA Size: 4.0 Number of attempts: 1 Airway Equipment and Method: bite block Placement Confirmation: positive ETCO2 Tube secured with: Tape Dental Injury: Teeth and Oropharynx as per pre-operative assessment

## 2013-09-14 NOTE — Anesthesia Postprocedure Evaluation (Signed)
  Anesthesia Post-op Note  Patient: Carla Mccoy  Procedure(s) Performed: Procedure(s): IRRIGATION AND DEBRIDEMENT LEFT BREAST ABSCESS (Left)  Patient Location: PACU  Anesthesia Type:General  Level of Consciousness: awake  Airway and Oxygen Therapy: Patient Spontanous Breathing  Post-op Pain: mild  Post-op Assessment: Post-op Vital signs reviewed  Post-op Vital Signs: Reviewed  Last Vitals:  Filed Vitals:   09/14/13 0930  BP: 127/60  Pulse: 85  Temp:   Resp: 17    Complications: No apparent anesthesia complications

## 2013-09-14 NOTE — Discharge Instructions (Signed)
°  Post Anesthesia Home Care Instructions  Activity: Get plenty of rest for the remainder of the day. A responsible adult should stay with you for 24 hours following the procedure.  For the next 24 hours, DO NOT: -Drive a car -Paediatric nurse -Drink alcoholic beverages -Take any medication unless instructed by your physician -Make any legal decisions or sign important papers.  Meals: Start with liquid foods such as gelatin or soup. Progress to regular foods as tolerated. Avoid greasy, spicy, heavy foods. If nausea and/or vomiting occur, drink only clear liquids until the nausea and/or vomiting subsides. Call your physician if vomiting continues.  Special Instructions/Symptoms: Your throat may feel dry or sore from the anesthesia or the breathing tube placed in your throat during surgery. If this causes discomfort, gargle with warm salt water. The discomfort should disappear within 24 hours.    Wet to dry dressing twice a day. Home health nursing will help out with wound care.  OK to shower.

## 2013-09-14 NOTE — H&P (Signed)
Transplants    None    Demographics Carla Mccoy 63 year old female  [address]  [place] [address] [phone] (M)  Comm Pref:   [address]  [place] [address] [phone] (M)   Problem ListHospitalization ProblemNon-Hospital  HYPOTHYROIDISM  HYPERLIPIDEMIA NEC/NOS  OBESITY NOS  COMMON MIGRAINE  SYDENHAM'S CHOREA  HYPERTENSION  FATTY LIVER DISEASE  BACK PAIN, CHRONIC  LEG CRAMPS  ABNORMAL GLUCOSE NEC  ANGIOEDEMA  CARPAL TUNNEL SYNDROME, HX OF  PEPTIC ULCER DISEASE, HX OF  HYSTERECTOMY, HX OF  TONSILLECTOMY, HX OF  Routine health maintenance  Breast cancer  Left breast abscess  Breast abscess  Chronic mastitis of left breast  OSA (obstructive sleep apnea)  Trigger finger, acquired  Benign paroxysmal positional vertigo  Significant History/Details  Smoking: Never Smoker   Smokeless Tobacco: Never Used  Alcohol: No  Comments: Administrator, Civil Service on file. 08/08/10  6 open orders  Preferred Language: English  Dialysis HistoryNone   Currently admitted as of 5/13/2015Specialty CommentsEditShow AllReportPHI SIGNED FOR FLOYD Lindquist DOB [birth year] (SP), CHRISTIE Schooler DOB 02/07/69 (DAU) & RODNEY Shelp DOB 06/[birth year]66 (SON) GM/03-23-13 DOS 09/14/13 TC-CDS-OP-debridment lt breast, 08/01/13, 19020, skm 08-26-13 pt op sx schd 09-14-13 @ [facility] no precert required per Halford Decamp A 10:23 am 08-26-13 use as ref#.(skm,tvp)   MedicationsHospital Medications Outpatient Medications  ceFAZolin (ANCEF) IVPB 2 g/50 mL premix  chlorhexidine (HIBICLENS) 4 % liquid 1 application  lactated ringers infusion    Preferred Labs   None   Transplant-Related Biopsies (11 years) ** None **  Patient Blood Type (50 years)   None                                 Recent Visits (Maximum of 10 visits)Date Type Provider Description  09/14/2013 Surgery Berneita Sanagustin A., MD   08/01/2013 Office Visit Stela Iwasaki A., MD Chronic Mastitis of Left Breast (Primary Dx)  07/04/2013 Office  Visit Tylerjames Hoglund A., MD Chronic Mastitis of Left Breast (Primary Dx)  06/24/2013 Office Visit Carlosdaniel Grob A., MD Chronic Mastitis of Left Breast (Primary Dx)  04/08/2013 Office Visit Harriette Bouillon A., MD Breast Abscess (Primary Dx)  03/28/2013 Office Visit Harriette Bouillon A., MD Breast Abscess (Primary Dx)  03/23/2013 Office Visit Harriette Bouillon A., MD Left Breast Abscess (Primary Dx)  03/22/2013 Telephone Eldridge Scot, LPN   [id number] Abstract Dortha Schwalbe., MD          My Last Outpatient Progress NoteStatus Last Janann August Date  Signed Mon Aug 01, 2013 10:18 AM EDT 08/01/2013  NAME: Carla Mccoy                                                                         DOB: 08-28-1950  DATE: 08/01/2013                                                                                     MRN: [id number]   CC:    Chief Complaint   Patient presents with   .  Breast Cancer Long Term Follow Up      Carla Mccoy is a 63 y.o.Marland Kitchenfemale   Carla Mccoy had routine yearly mammography April 12, 2009, showing a possible mass in the left breast. Her breasts are dense which of course limits mammographic sensitivity. She was recalled December 15th for additional views and Dr. Judyann Munson noted a density in the lateral aspect of the left breast. There were no associated malignant type calcifications and it was not palpable by exam. By ultrasound, there was sonographic positivity noted and biopsy was obtained the same day showing ([id number]) benign breast parenchyma on the left. MRI of the breast was obtained on December 21st for further evaluation, and showed an irregular spiculated mass measuring 1.5 cm in the left breast, corresponding with the mammographic abnormality. In the right breast, there was an area of clumped enhancement measuring up to 3.7 cm. Bilateral ultrasounds were then  performed December 28th. Again, there were no palpable masses in either breast. In the right breast, there were multiple irregular hypoechoic areas but no discrete abnormality corresponding to the abnormal enhancement in the MRI. In the left breast, there were three separate masses with multiple areas of ill-defined shadowing, but there were no definite findings correlating with the irregular area of enhancement seen in the recent MRI.   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 The prognostic profile showed the tumor to be not amplified for Her-2 with a ratio of 0.8. ER was positive at 99%. PR was positive at 100%, and the MIB-1 was 13%.   With this information, the patient was referred to Dr. Luisa Hart and after appropriate discussion, he proceeded to definitive surgery February 1st, 2011, the final pathology from that procedure ([id number]) showing a 1.2 cm invasive mammary carcinoma, primarily ductal with some lobular features, with negative margins and 0 of 2 sentinel lymph nodes involved. The tumor was grade 1. The closest margin was 3 mm posteriorly. There was no evidence of lymphovascular invasion.   INTERVAL HISTORY:   Patient returns for three-month checkup. She's still having some redness and discomfort with some intermittent drainage from the incision. Denies fever or chills. A   PFSH: She has had no significant changes since the last visit here.   ROS: There have been no significant changes since the last visit here   EXAM:   VS: BP 142/80  Pulse 78  Temp(Src) 97.7 F (36.5 C) (Oral)  Resp 16  Ht 5\' 7"  (1.702 m)  Wt 191 lb 3.2 oz (86.728 kg)  BMI 29.94 kg/m2   General: The patient is alert, oriented, generally healthy appearing, NAD. Mood and affect are normal.   Breasts: Mild redness and  swelling left breast at lumpectomy site. Drainage noted from old scar. Q-tip placed and  smaller cavity. Well-drained. Nipple inverted. Right breast normal.   Lymphatics: She has no axillary or supraclavicular adenopathy on either side.   Extremities: Full ROM of the surgical side with no lymphedema noted.   Data Reviewed: Clinical Data: History of left breast cancer, status-post   lumpectomy in February 2011. This is the patient's first mammogram   of the left breast following surgery. She reports tenderness at   the lumpectomy site.   DIGITAL DIAGNOSTIC BILATERAL MAMMOGRAM WITH CAD   Comparison: With prior   Findings:   ACR Breast Density Category is 3: The breast tissue is   heterogeneously dense.   The parenchymal pattern of the right breast is stable. There is a   cylindrical biopsy clip in the upper central right breast at the   site of a prior benign MRI guided biopsy. No mass, distortion, or   suspicious microcalcification is identified in the right breast.   On the left, there are lumpectomy changes in the upper central left   breast anteriorly. There are benign peripheral rounded   calcifications at the lumpectomy site consistent with fat necrosis.   A ribbon-shaped biopsy clip is seen in the lower outer quadrant of   the right breast, at the site of the prior benign ultrasound   guided. There is slight skin thickening of the left breast. No   definite evidence of malignancy on the left.   Mammographic images were processed with CAD.   IMPRESSION:   Lumpectomy and radiation changes of the left breast. No evidence   of malignancy in either breast.   RECOMMENDATION:   Bilateral diagnostic mammogram in 1 year.     Impression: Chronic left breast mastitis secondary to recent surgery and radiation therapy has now become chronic     History of left breast cancer 2011 status post breast conservation therapy for stage I left breast cancer lobular ER/PR  positive   Plan:  Pt has failed medical management.  Recommend debridement left breast. The procedure has been discussed with the patient.  Alternative therapies have been discussed with the patient.  Operative risks include bleeding,  Infection,  Organ injury,  Nerve injury,  Blood vessel injury,  DVT,  Pulmonary embolism,  Death,  And possible reoperation.  Medical management risks include worsening of present situation.  The success of the procedure is 50 -90 % at treating patients symptoms.  The patient understands and agrees to proceed.

## 2013-09-15 ENCOUNTER — Encounter (INDEPENDENT_AMBULATORY_CARE_PROVIDER_SITE_OTHER): Payer: Self-pay | Admitting: Surgery

## 2013-09-15 ENCOUNTER — Ambulatory Visit (INDEPENDENT_AMBULATORY_CARE_PROVIDER_SITE_OTHER): Payer: BC Managed Care – PPO | Admitting: Surgery

## 2013-09-15 VITALS — BP 128/80 | HR 74 | Temp 98.6°F | Ht 66.0 in | Wt 195.6 lb

## 2013-09-15 DIAGNOSIS — C50919 Malignant neoplasm of unspecified site of unspecified female breast: Secondary | ICD-10-CM

## 2013-09-15 DIAGNOSIS — N611 Abscess of the breast and nipple: Secondary | ICD-10-CM

## 2013-09-15 DIAGNOSIS — N61 Mastitis without abscess: Secondary | ICD-10-CM

## 2013-09-15 NOTE — Progress Notes (Signed)
Subjective:     Patient ID: Carla Mccoy, female   DOB: Sep 16, 1950, 63 y.o.   MRN: [id number]  HPI  Note: This dictation was prepared with Dragon/digital dictation along with [facility] technology. Any transcriptional errors that result from this process are unintentional.       WAYLON KOFFLER  1951-02-12 [id number]  Patient Care Team: Abner Greenspan, MD as PCP - General (Family Medicine) Peri Maris, MD (Obstetrics and Gynecology) Renella Cunas, MD (Cardiology) Lennon Alstrom, MD (Neurology) Sable Feil, MD (Gastroenterology) Chauncey Cruel, MD (Hematology and Oncology) Joyice Faster. Cornett, MD (General Surgery) Sharol Roussel, OD (Optometry) Deliah Goody, MD (Ophthalmology)  Procedure (Date: 09/14/2013):  PREOPERATIVE DIAGNOSIS: History of left breast cancer with post-  radiation mastitis and chronic open drain, left breast wound.   POSTOPERATIVE DIAGNOSIS: History of left breast cancer with post-  radiation mastitis and chronic open drain, left breast wound.   PROCEDURE: Debridement of left breast with biopsy of lumpectomy, cavity  measuring 4 cm x 3 cm x 2 cm, full-thickness skin and subcutaneous fat.   SURGEON: Marcello Moores A. Cornett, M.D.   This patient returns for surgical re-evaluation.  She required surgery for a chronic draining wound.  Attempts were made to set up home health wound care & follow up but she was told staffing was low.  Therefore, she comes in today for dressing change.  She is sore but pain is under control.  She denies fevers or chills.  No major drainage.  Patient Active Problem List   Diagnosis Date Noted  . Trigger finger, acquired 08/26/2013  . Benign paroxysmal positional vertigo 08/26/2013  . OSA (obstructive sleep apnea) 08/10/2013  . Chronic mastitis of left breast 07/04/2013  . Breast abscess 03/28/2013  . Left breast abscess 03/23/2013  . Breast cancer 02/10/2012  . Routine health maintenance 09/06/2011  . LEG CRAMPS  04/12/2009  . 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 BACK PAIN, CHRONIC 05/10/2007  . PEPTIC ULCER DISEASE, HX OF 05/10/2007  . HYSTERECTOMY, HX OF 05/10/2007  . TONSILLECTOMY, HX OF 05/10/2007  . HYPOTHYROIDISM 01/28/2007  . HYPERLIPIDEMIA NEC/NOS 01/28/2007  . OBESITY NOS 01/28/2007  . COMMON MIGRAINE 01/28/2007  . HYPERTENSION 01/28/2007  . FATTY LIVER DISEASE 01/28/2007  . ABNORMAL GLUCOSE NEC 01/28/2007  . ANGIOEDEMA 01/28/2007  . CARPAL TUNNEL SYNDROME, HX OF 01/27/2007    Past Medical History  Diagnosis Date  . Tachy-brady syndrome   . Backache, unspecified   . Personal history of peptic ulcer disease   . 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 Other and unspecified hyperlipidemia   . Other abnormal glucose   . Obesity, unspecified   . 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 Other chronic nonalcoholic liver disease   . Migraine without aura, without mention of intractable migraine without mention of status migrainosus   . Unspecified hypothyroidism   . breast ca dx'd 06/2009    breast cancer left - lumpectomy  . Unspecified essential hypertension 09/12/13    remote history; no current treatment    Past Surgical History  Procedure Laterality Date  . Laparoscopy abdomen diagnostic    . Tonsillectomy    . Breast lumpectomy Left     Feb '11  . Breast biopsy Left     in the 80's - benign tissue  . Abdominal hysterectomy  1988  . Tubal ligation  1971    History  Social History  . Marital Status: Married    Spouse Name: N/A    Number of Children: N/A  . Years of Education: 14   Occupational History  . Geophysicist/field seismologist    Social History Main Topics  . Smoking status: Never Smoker   . Smokeless tobacco: Never Used  . Alcohol Use: No  . Drug Use: No  . Sexual Activity: Yes    Partners: Male   Other Topics Concern  . Not on file   Social History Narrative   HSG, [employer] - 3 yrs, no degree. Married -'42   1 son -'29, 1 daughter-'70; 2 grandchildren.  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  Marriage is in good health. She enjoys her work.                         Family History  Problem Relation Age of Onset  . Peripheral vascular disease Mother   . Coronary artery disease Mother   . Hyperlipidemia Mother   . Heart disease Mother     CAD/fatal MI  . Diabetes Mother   . Stroke Mother   . Diabetes Sister   . Hypothyroidism Sister   . Diabetes Brother   . Hyperlipidemia Brother   . Heart disease Brother     Current Outpatient Prescriptions  Medication Sig Dispense Refill  . Cholecalciferol (VITAMIN D3) 2000 UNITS TABS Take by mouth.        [name] HYDROcodone-acetaminophen (NORCO) 5-325 MG per tablet Take 1 tablet by mouth every 6 (six) hours as needed for moderate pain.  30 tablet  0  . levothyroxine (SYNTHROID, LEVOTHROID) 175 MCG tablet TAKE ONE TABLET BY MOUTH ONCE DAILY  90 tablet  3  . Multiple Vitamins-Minerals (MULTIVITAMIN,TX-MINERALS) tablet Take 1 tablet by mouth daily.        . Omega-3 Fatty Acids (OMEGA 3 PO) Take 1 capsule by mouth daily.      . vitamin E 100 UNIT capsule Take 100 Units by mouth daily.         No current facility-administered medications for this visit.     Allergies  Allergen Reactions  . Codeine   . Metoprolol     Lips swelled/ hands swelled and her skin turned red   . Sulfonamide Derivatives     BP 128/80  Pulse 74  Temp(Src) 98.6 F (37 C)  Ht 5\' 6"  (1.676 m)  Wt 195 lb 9.6 oz (88.724 kg)  BMI 31.59 kg/m2  No results found.   Review of Systems  Constitutional: Negative for fever, chills and diaphoresis.  HENT: Negative for ear pain, sore throat and trouble swallowing.   Eyes: Negative for photophobia and visual disturbance.  Respiratory: Negative for cough and choking.   Cardiovascular: Negative for chest pain and palpitations.  Gastrointestinal: Negative for nausea, vomiting, abdominal pain, diarrhea, constipation, anal  bleeding and rectal pain.  Genitourinary: Negative for dysuria, frequency and difficulty urinating.  Musculoskeletal: Negative for gait problem and myalgias.  Skin: Positive for wound. Negative for color change, pallor and rash.  Neurological: Negative for dizziness, speech difficulty, weakness and numbness.  Hematological: Negative for adenopathy.  Psychiatric/Behavioral: Negative for confusion and agitation. The patient is not nervous/anxious.        Objective:   Physical Exam  Constitutional: She is oriented to person, place, and time. She appears well-developed and well-nourished. No distress.  HENT:  Head: Normocephalic.  Mouth/Throat: Oropharynx is clear and moist. No oropharyngeal exudate.  Eyes: Conjunctivae and EOM are normal. Pupils are equal, round, and reactive to light. No scleral icterus.  Neck: Normal range of motion. No tracheal deviation present.  Cardiovascular: Normal rate and intact distal pulses.   Pulmonary/Chest: Effort normal. No respiratory distress. She exhibits no tenderness.    Abdominal: Soft. She exhibits no distension. There is no tenderness. Hernia confirmed negative in the right inguinal area and confirmed negative in the left inguinal area.  Incisions clean with normal healing ridges.  No hernias  Genitourinary: No vaginal discharge found.  Musculoskeletal: Normal range of motion. She exhibits no tenderness.  Lymphadenopathy:       Right: No inguinal adenopathy present.       Left: No inguinal adenopathy present.  Neurological: She is alert and oriented to person, place, and time. No cranial nerve deficit. She exhibits normal muscle tone. Coordination normal.  Skin: Skin is warm and dry. No rash noted. She is not diaphoretic.  Psychiatric: She has a normal mood and affect. Her behavior is normal.       Assessment:     Recovering after incision and drainage of chronic seroma/mastitis in the setting of prior cancer and radiation.  Wound cleaning  up.    Plan:     Daily dressing changes.  Probably use rolled gauze.  She will return tomorrow for dressing change.  She is helping her family member that is a Marine scientist can help assume care we teaching.  Followup on pathology of wound biopsy done in OR  Followup with Dr. Brantley Stage in 2 weeks, sooner if there are issues.  I updated the patient's status to the patient.  Recommendations were made.  Questions were answered.  The patient expressed understanding & appreciation.

## 2013-09-15 NOTE — Patient Instructions (Signed)
WOUND CARE  It is important that the wound be kept open.   -Keeping the skin edges apart will allow the wound to gradually heal from the base upwards.   - If the skin edges of the wound close too early, a new fluid pocket can form and infection can occur. -This is the reason to pack deeper wounds with gauze or ribbon -This is why drained wounds cannot be sewed closed right away  A healthy wound should form a lining of bright red "beefy" granulating tissue that will help shrink the wound and help the edges grow new skin into it.   -A little mucus / yellow discharge is normal (the body's natural way to try and form a scab) and should be gently washed off with soap and water with daily dressing changes.  -Green or foul smelling drainage implies bacterial colonization and can slow wound healing - a short course of antibiotic ointment (3-5 days) can help it clear up.  Call the doctor if it does not improve or worsens  -Avoid use of antibiotic ointments for more than a week as they can slow wound healing over time.    -Sometimes other wound care products will be used to reduce need for dressing changes and/or help clean up dirty wounds -Sometimes the surgeon needs to debride the wound in the office to remove dead or infected tissue out of the wound so it can heal more quickly and safely.    Change the dressing at least once a day -Wash the wound with mild soap and water gently every day.  It is good to shower or bathe the wound to help it clean out. -Use clean 4x4 gauze for medium/large wounds or ribbon plain NU-gauze for smaller wounds (it does not need to be sterile, just clean) -Keep the raw wound moist with a little saline or KY (saline) gel on the gauze.  -A dry wound will take longer to heal.  -Keep the skin dry around the wound to prevent breakdown and irritation. -Pack the wound down to the base -The goal is to keep the skin apart, not overpack the wound -Use a Q-tip or blunt-tipped kabob  stick toothpick to push the gauze down to the base in narrow or deep wounds   -Cover with a clean gauze and tape -paper or Medipore tape tend to be gentle on the skin -rotate the orientation of the tape to avoid repeated stress/trauma on the skin -using an ACE or Coban wrap on wounds on arms or legs can be used instead.  Complete all antibiotics through the entire prescription to help the infection heal and prevent new places of infection   Returning the see the surgeon is helpful to follow the healing process and help the wound close as fast as possible.  Managing Pain  Pain after surgery or related to activity is often due to strain/injury to muscle, tendon, nerves and/or incisions.  This pain is usually short-term and will improve in a few months.   Many people find it helpful to do the following things TOGETHER to help speed the process of healing and to get back to regular activity more quickly:  1. Avoid heavy physical activity a.  no lifting greater than 20 pounds b. Do not "push through" the pain.  Listen to your body and avoid positions and maneuvers than reproduce the pain c. Walking is okay as tolerated, but go slowly and stop when getting sore.  d. Remember: If it hurts to do  it, then don't do it! 2. Take Anti-inflammatory medication  a. Take with food/snack around the clock for 1-2 weeks i. This helps the muscle and nerve tissues become less irritable and calm down faster b. Choose ONE of the following over-the-counter medications: i. Naproxen 220mg  tabs (ex. Aleve) 1-2 pills twice a day  ii. Ibuprofen 200mg  tabs (ex. Advil, Motrin) 3-4 pills with every meal and just before bedtime iii. Acetaminophen 500mg  tabs (Tylenol) 1-2 pills with every meal and just before bedtime 3. Use a Heating pad or Ice/Cold Pack a. 4-6 times a day b. May use warm bath/hottub  or showers 4. Try Gentle Massage and/or Stretching  a. at the area of pain many times a day b. stop if you feel pain -  do not overdo it  Try these steps together to help you body heal faster and avoid making things get worse.  Doing just one of these things may not be enough.    If you are not getting better after two weeks or are noticing you are getting worse, contact our office for further advice; we may need to re-evaluate you & see what other things we can do to help.  Abscess An abscess is an infected area that contains a collection of pus and debris.It can occur in almost any part of the body. An abscess is also known as a furuncle or boil. CAUSES  An abscess occurs when tissue gets infected. This can occur from blockage of oil or sweat glands, infection of hair follicles, or a minor injury to the skin. As the body tries to fight the infection, pus collects in the area and creates pressure under the skin. This pressure causes pain. People with weakened immune systems have difficulty fighting infections and get certain abscesses more often.  SYMPTOMS Usually an abscess develops on the skin and becomes a painful mass that is red, warm, and tender. If the abscess forms under the skin, you may feel a moveable soft area under the skin. Some abscesses break open (rupture) on their own, but most will continue to get worse without care. The infection can spread deeper into the body and eventually into the bloodstream, causing you to feel ill.  DIAGNOSIS  Your caregiver will take your medical history and perform a physical exam. A sample of fluid may also be taken from the abscess to determine what is causing your infection. TREATMENT  Your caregiver may prescribe antibiotic medicines to fight the infection. However, taking antibiotics alone usually does not cure an abscess. Your caregiver may need to make a small cut (incision) in the abscess to drain the pus. In some cases, gauze is packed into the abscess to reduce pain and to continue draining the area. HOME CARE INSTRUCTIONS   Only take over-the-counter or  prescription medicines for pain, discomfort, or fever as directed by your caregiver.  If you were prescribed antibiotics, take them as directed. Finish them even if you start to feel better.  If gauze is used, follow your caregiver's directions for changing the gauze.  To avoid spreading the infection:  Keep your draining abscess covered with a bandage.  Wash your hands well.  Do not share personal care items, towels, or whirlpools with others.  Avoid skin contact with others.  Keep your skin and clothes clean around the abscess.  Keep all follow-up appointments as directed by your caregiver. SEEK MEDICAL CARE IF:   You have increased pain, swelling, redness, fluid drainage, or bleeding.  You have  muscle aches, chills, or a general ill feeling.  You have a fever. MAKE SURE YOU:   Understand these instructions.  Will watch your condition.  Will get help right away if you are not doing well or get worse. Document Released: 01/29/2005 Document Revised: 10/21/2011 Document Reviewed: 07/04/2011 [facility] Patient Information 2014 [place].  Mastitis  Mastitis is inflammation of the breast tissue. It occurs most often in women who are breastfeeding, but it can also affect other women, and even sometimes men. CAUSES  Mastitis is usually caused by a bacterial infection. Bacteria enter the breast tissue through cuts or openings in the skin. Typically, this occurs with breastfeeding because of cracked or irritated skin. Sometimes, it can occur even when there is no opening in the skin. It can be associated with plugged milk (lactiferous) ducts. Nipple piercing can also lead to mastitis. Also, some forms of breast cancer can cause mastitis. SIGNS AND SYMPTOMS   Swelling, redness, tenderness, and pain in an area of the breast.  Swelling of the glands under the arm on the same side.  Fever. If an infection is allowed to progress, a collection of pus (abscess) may  develop. DIAGNOSIS  Your health care provider can usually diagnose mastitis based on your symptoms and a physical exam. Tests may be done to help confirm the diagnosis. These may include:   Removal of pus from the breast by applying pressure to the area. This pus can be examined in the lab to determine which bacteria are present. If an abscess has developed, the fluid in the abscess can be removed with a needle. This can also be used to confirm the diagnosis and determine the bacteria present. In most cases, pus will not be present.  Blood tests to determine if your body is fighting a bacterial infection.  Mammogram or ultrasound tests to rule out other problems or diseases. TREATMENT  Antibiotic medicine is used to treat a bacterial infection. Your health care provider will determine which bacteria are most likely causing the infection and will select an appropriate antibiotic. This is sometimes changed based on the results of tests performed to identify the bacteria, or if there is no response to the antibiotic selected. Antibiotics are usually given by mouth. You may also be given medicine for pain. Mastitis that occurs with breastfeeding will sometimes go away on its own, so your health care provider may choose to wait 24 hours after first seeing you to decide whether a prescription medicine is needed. HOME CARE INSTRUCTIONS   Only take over-the-counter or prescription medicines for pain, fever, or discomfort as directed by your health care provider.  If your health care provider prescribed an antibiotic, take the medicine as directed. Make sure you finish it even if you start to feel better.  Do not wear a tight or underwire bra. Wear a soft, supportive bra.  Increase your fluid intake, especially if you have a fever.  Women who are breastfeeding should follow these instructions:  Continue to empty the breast. Your health care provider can tell you whether this milk is safe for your  infant or needs to be thrown out. You may be told to stop nursing until your health care provider thinks it is safe for your baby. Use a breast pump if you are advised to stop nursing.  Keep your nipples clean and dry.  Empty the first breast completely before going to the other breast. If your baby is not emptying your breasts completely for some reason,  use a breast pump to empty your breasts.  If you go back to work, pump your breasts while at work to stay in time with your nursing schedule.  Avoid allowing your breasts to become overly filled with milk (engorged). SEEK MEDICAL CARE IF:   You have pus-like discharge from the breast.  Your symptoms do not improve with the treatment prescribed by your health care provider within 2 days. SEEK IMMEDIATE MEDICAL CARE IF:   Your pain and swelling are getting worse.  You have pain that is not controlled with medicine.  You have a red line extending from the breast toward your armpit.  You have a fever or persistent symptoms for more than 2 3 days.  You have a fever and your symptoms suddenly get worse. Document Released: 04/21/2005 Document Revised: 02/09/2013 Document Reviewed: 11/19/2012 [facility] Patient Information 2014 [place].

## 2013-09-15 NOTE — Op Note (Signed)
NAME:  Carla Mccoy, Carla Mccoy               ACCOUNT NO.:  [id number]  MEDICAL RECORD NO.:  [id number]  LOCATION:                                 FACILITY:  PHYSICIAN:  Zalan Shidler A. 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  PROCEDURE:  Debridement of left breast with biopsy of lumpectomy, cavity measuring 4 cm x 3 cm x 2 cm, full-thickness skin and subcutaneous fat.  SURGEON:  Marcello Moores A. Ahaana Rochette, M.D.  ANESTHESIA:  LMA with 0.25% Marcaine with epinephrine.  EBL:  Minimal.  SPECIMEN:  Please see above.  INDICATIONS FOR PROCEDURE:  The patient is a pleasant 63 year old female who underwent lumpectomy in February 2011 for a stage I left breast cancer.  She received postop radiation therapy.  Over the last 6 months, she has developed mastitis in left breast, progression of this despite antibiotic therapy and drainage in the office.  At this point in time, I recommended debridement since she has failed medical management, office- based management, as well as, antibiotics.  Risks, benefits, and alternative therapies discussed.  The potential for possible mastectomy if this did not heal were discussed as well.  The discussion of an open wound that needs packing as well as debridement of breast tissue discussed.  Cosmetic deformity discussed.  She understood the above and she agreed to proceed.  DESCRIPTION OF PROCEDURE:  The patient was met in the holding area. Left breast was marked.  She has been taken back to the operating room and placed supine on the OR table.  After appropriate levels of LMA anesthesia were induced, left  breast was prepped and draped in sterile fashion.  Time-out was done.  She received 2 g of Ancef.  Curvilinear incision was made along the open incision involving the left breast just above the nipple areolar complex.  The nipple was retracted.  I released the nipple using a scalpel.  The cavity was then debrided back to healthy breast tissue.  I had to take this all the way down to the chest wall since this was a bilobed cavity.  This was well irrigated and had good bleeding edges and healthy fat.  The total area debrided was 4 cm x 3 cm x 2 cm. This was packed with saline-soaked Kerlix.  Dry dressing was applied. The central portion of nipple had to be debrided as well since it was necrotic.  All final counts were found to be correct.  The patient was awoken, extubated, and taken to recovery in satisfactory condition.     Amybeth Sieg A. Lakynn Halvorsen, M.D.   ______________________________ Joyice Faster. Kathaleen Dudziak, M.D.    TAC/MEDQ  D:  09/14/2013  T:  09/14/2013  Job:  [id number]

## 2013-09-16 ENCOUNTER — Ambulatory Visit (INDEPENDENT_AMBULATORY_CARE_PROVIDER_SITE_OTHER): Payer: BC Managed Care – PPO | Admitting: General Surgery

## 2013-09-16 ENCOUNTER — Encounter (INDEPENDENT_AMBULATORY_CARE_PROVIDER_SITE_OTHER): Payer: Self-pay | Admitting: General Surgery

## 2013-09-16 VITALS — BP 123/81 | HR 77 | Temp 98.0°F | Resp 16 | Ht 66.0 in | Wt 194.2 lb

## 2013-09-16 DIAGNOSIS — Z4800 Encounter for change or removal of nonsurgical wound dressing: Secondary | ICD-10-CM

## 2013-09-16 NOTE — Patient Instructions (Signed)
Patient came in today for wound dressing change in her left breast. I packed the breast wound with a wet Kerlix, I cut about 6 inch of the Kerlix and packed into the wound and the patient watched me pack her wound, I told her that it will be easier for her if she stand in front of a mirror and I told her to make sure that she packs all around the area inside her breast, and she can do it daily. I told her that she can get into the shower and let the water run into and when she gets out,put more packing inside. I told her that we wanted it to heal from the inside out. I placed dry gauze over the wound and tape over the wound and I told her that she can wear a sports bra and place gauze over the wound

## 2013-09-23 ENCOUNTER — Telehealth (INDEPENDENT_AMBULATORY_CARE_PROVIDER_SITE_OTHER): Payer: Self-pay

## 2013-09-23 ENCOUNTER — Encounter (INDEPENDENT_AMBULATORY_CARE_PROVIDER_SITE_OTHER): Payer: BC Managed Care – PPO | Admitting: General Surgery

## 2013-09-23 ENCOUNTER — Encounter (INDEPENDENT_AMBULATORY_CARE_PROVIDER_SITE_OTHER): Payer: Self-pay | Admitting: Surgery

## 2013-09-23 ENCOUNTER — Ambulatory Visit (INDEPENDENT_AMBULATORY_CARE_PROVIDER_SITE_OTHER): Payer: BC Managed Care – PPO | Admitting: Surgery

## 2013-09-23 VITALS — BP 118/80 | HR 84 | Temp 97.9°F | Resp 16 | Ht 66.25 in | Wt 188.8 lb

## 2013-09-23 DIAGNOSIS — Z4800 Encounter for change or removal of nonsurgical wound dressing: Secondary | ICD-10-CM

## 2013-09-23 NOTE — Progress Notes (Signed)
Patient returns 2 weeks at the left breast debridement. 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 She is changing her dressings at home with the help of her daughter. She is doing well. She came in today for wound check to his drainage.  Exam: Left breast wound is 4 cm x 3 cm x 3 cm. Minimal fibrinous exudate. No erythema.  80% granulated.  Impression: Status post debridement left breast  Plan: Continue wet-to-dry dressing changes. Return 2 weeks or sooner. No signs of infection.

## 2013-09-23 NOTE — Patient Instructions (Signed)
Return 1 week.

## 2013-09-23 NOTE — Telephone Encounter (Signed)
Patient states her left breast is swollen ,not sure if area is becoming infected. Denies temp, unsure if heat in breast . Urg appointment today 345p .

## 2013-09-30 ENCOUNTER — Encounter (INDEPENDENT_AMBULATORY_CARE_PROVIDER_SITE_OTHER): Payer: Self-pay | Admitting: Surgery

## 2013-09-30 ENCOUNTER — Ambulatory Visit (INDEPENDENT_AMBULATORY_CARE_PROVIDER_SITE_OTHER): Payer: BC Managed Care – PPO | Admitting: Surgery

## 2013-09-30 VITALS — BP 122/80 | HR 74 | Resp 12 | Ht 66.3 in | Wt 183.4 lb

## 2013-09-30 DIAGNOSIS — Z9889 Other specified postprocedural states: Secondary | ICD-10-CM

## 2013-09-30 NOTE — Progress Notes (Signed)
Patient returns 4 weeks at the left breast debridement. 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 She is changing her dressings at home with the help of her daughter. She is doing well. She came in today for wound check to his drainage.  Exam: Left breast wound is 3 cm x 3 cm x 3 cm. Minimal fibrinous exudate. No erythema.  80% granulated.  Impression: Status post debridement left breast  Plan: Continue wet-to-dry dressing changes. Return 1 week . No signs of infection.

## 2013-09-30 NOTE — Patient Instructions (Signed)
Return 1 week.

## 2013-10-07 ENCOUNTER — Encounter (INDEPENDENT_AMBULATORY_CARE_PROVIDER_SITE_OTHER): Payer: Self-pay | Admitting: Surgery

## 2013-10-07 ENCOUNTER — Ambulatory Visit (INDEPENDENT_AMBULATORY_CARE_PROVIDER_SITE_OTHER): Payer: BC Managed Care – PPO | Admitting: Surgery

## 2013-10-07 VITALS — BP 124/76 | HR 72 | Temp 98.0°F | Resp 18 | Ht 66.0 in | Wt 183.0 lb

## 2013-10-07 DIAGNOSIS — Z9889 Other specified postprocedural states: Secondary | ICD-10-CM

## 2013-10-07 NOTE — Progress Notes (Signed)
Patient returns 5 weeks at the left breast debridement. 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 She is changing her dressings at home with the help of her daughter. She is doing well. She came in today for wound check to his drainage.  Exam: Left breast wound is 3 cm x 3 cm x 3 cm. Minimal fibrinous exudate. No erythema.  80% granulated. Small amount of dead tissue debrided today   Impression: Status post debridement left breast  Plan: Continue wet-to-dry dressing changes. Return 3 week . No signs of infection. May wash wound .

## 2013-10-07 NOTE — Patient Instructions (Signed)
May wash wound.  Return 3 weeks.

## 2013-10-31 ENCOUNTER — Encounter (INDEPENDENT_AMBULATORY_CARE_PROVIDER_SITE_OTHER): Payer: BC Managed Care – PPO | Admitting: Surgery

## 2013-11-02 ENCOUNTER — Encounter (INDEPENDENT_AMBULATORY_CARE_PROVIDER_SITE_OTHER): Payer: Self-pay | Admitting: Surgery

## 2013-11-02 ENCOUNTER — Ambulatory Visit (INDEPENDENT_AMBULATORY_CARE_PROVIDER_SITE_OTHER): Payer: BC Managed Care – PPO | Admitting: Family Medicine

## 2013-11-02 ENCOUNTER — Ambulatory Visit (INDEPENDENT_AMBULATORY_CARE_PROVIDER_SITE_OTHER): Payer: BC Managed Care – PPO | Admitting: Surgery

## 2013-11-02 ENCOUNTER — Encounter: Payer: Self-pay | Admitting: Family Medicine

## 2013-11-02 VITALS — BP 126/68 | HR 70 | Temp 98.1°F | Ht 66.0 in | Wt 188.2 lb

## 2013-11-02 VITALS — BP 126/78 | HR 72 | Resp 14 | Ht 66.3 in | Wt 188.0 lb

## 2013-11-02 DIAGNOSIS — G5601 Carpal tunnel syndrome, right upper limb: Secondary | ICD-10-CM

## 2013-11-02 DIAGNOSIS — M65839 Other synovitis and tenosynovitis, unspecified forearm: Secondary | ICD-10-CM

## 2013-11-02 DIAGNOSIS — M79609 Pain in unspecified limb: Secondary | ICD-10-CM

## 2013-11-02 DIAGNOSIS — Z9889 Other specified postprocedural states: Secondary | ICD-10-CM

## 2013-11-02 DIAGNOSIS — M65849 Other synovitis and tenosynovitis, unspecified hand: Secondary | ICD-10-CM

## 2013-11-02 DIAGNOSIS — M6530 Trigger finger, unspecified finger: Secondary | ICD-10-CM

## 2013-11-02 DIAGNOSIS — G5600 Carpal tunnel syndrome, unspecified upper limb: Secondary | ICD-10-CM

## 2013-11-02 DIAGNOSIS — M65949 Unspecified synovitis and tenosynovitis, unspecified hand: Secondary | ICD-10-CM

## 2013-11-02 DIAGNOSIS — M79641 Pain in right hand: Secondary | ICD-10-CM

## 2013-11-02 DIAGNOSIS — M659 Synovitis and tenosynovitis, unspecified: Secondary | ICD-10-CM

## 2013-11-02 NOTE — Patient Instructions (Signed)
Return one month.  Continue present care.

## 2013-11-02 NOTE — Progress Notes (Signed)
Patient returns 8 weeks at the left breast debridement. 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 She is changing her dressings at home with the help of her daughter. She is doing well. She came in today for wound check to his drainage. Developed trigger finger on right  Exam: Left breast wound is 3 cm x 3 cm x 3 cm. Minimal fibrinous exudate. No erythema.  80% granulated. Small amount of dead tissue debrided today   Impression: Status post debridement left breast  Plan: Continue wet-to-dry dressing changes. Return  week . No signs of infection. May wash wound .  Very slow progress.  May need to consider wound vac.

## 2013-11-02 NOTE — Progress Notes (Signed)
[address] [place] [address] Phone: [phone] Fax: [phone]  Patient ID: Carla Mccoy MRN: [id number], DOB: 1951/04/14, 63 y.o. Date of Encounter: 11/02/2013  Primary Physician:  Roxy Manns, MD   Chief Complaint: Hand Pain   Subjective:   Dear Dr. Milinda Antis,  Thank you for having me see Carla Mccoy in consultation today at [facility] at [facility] for her problem with RIGHT hand pain.  As you may recall, she is a 63 y.o. year old female with a history of repetitive use of her hands in the workplace, and she is a computer all the time. 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 She is right-hand dominant. She also is having some tingling, more in the medial hand and in the 1st 2nd and 3rd digits. This is been ongoing for multiple years. She has not done any kind of interventions for this, no cockup wrist splints, injections, or procedures.   For the last several years and for the last several years and hand and fingers would go numb - this has been several years. Thought that maybe b/c using it a lot.   Retired a lot in 2013 and started to knit and cannot make fist - now she is getting some triggering is quite painful on the RIGHT 4th digit. She is having pain on the 3rd digit with flexion and on the volar aspect of that digit and into the palm.  R 4th trigger finger. Less on the 3rd.  3rd Tenosynovitis.  She has not had any kind of trauma, injury, rather operative intervention in the affected hand or digits. She has not had any kind of significant neck problem. She is not having any radiculopathy or other neuropathic symptoms in her upper extremity.  Past Medical History  Diagnosis Date  . Tachy-brady syndrome   . Backache, unspecified   . Personal history of peptic ulcer disease   . 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 Other and unspecified hyperlipidemia   . Other abnormal glucose   . Obesity, unspecified   . 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 Other chronic nonalcoholic liver disease   . Migraine without aura, without mention of intractable migraine without mention of status migrainosus   . Unspecified hypothyroidism   . breast ca dx'd 06/2009    breast cancer left - lumpectomy  . Unspecified essential hypertension 09/12/13    remote history; no current treatment    Past Surgical History  Procedure Laterality Date  . Laparoscopy abdomen diagnostic    . Tonsillectomy    . Breast lumpectomy Left     Feb '11  . Breast biopsy Left     in the 80's - benign tissue  . Abdominal hysterectomy  1988  . Tubal ligation  1971  . Incision and drainage of wound Left 09/14/2013    Procedure: IRRIGATION AND DEBRIDEMENT LEFT BREAST ABSCESS;  Surgeon: Maisie Fus A. Cornett, MD;  Location: [facility];  Service: General;  Laterality: Left;    History   Social History  . Marital Status: Married    Spouse Name: N/A    Number of Children: N/A  . Years of Education: 14   Occupational History  . Firefighter  Social History Main Topics  . Smoking status: Never Smoker   . Smokeless tobacco: Never Used  . Alcohol Use: No  . Drug Use: No  . Sexual Activity: Yes    Partners: Male   Other Topics Concern  . None   Social History Narrative   HSG, [employer] - 3 yrs, no degree. Married -'75  1 son -'8, 1 daughter-'70; 2 grandchildren.  Work - Patent examiner for [employer].  Marriage is in good health. She enjoys her work.                         Family History  Problem Relation Age of Onset  . Peripheral vascular disease Mother   . Coronary artery disease Mother   . Hyperlipidemia Mother   . Heart disease Mother     CAD/fatal MI  . Diabetes Mother   . Stroke Mother   . Diabetes Sister   . Hypothyroidism Sister   . Diabetes Brother    . Hyperlipidemia Brother   . Heart disease Brother     Medications and Allergies reviewed  Review of Systems:    GEN: No fevers, chills. Nontoxic. Primarily MSK c/o today. MSK: Detailed in the HPI GI: tolerating PO intake without difficulty Neuro: as above Otherwise, the pertinent positives and negatives are listed above and in the HPI, otherwise a full review of systems has been reviewed and is negative unless noted positive.   Objective:   Physical Examination: BP 126/68  Pulse 70  Temp(Src) 98.1 F (36.7 C) (Oral)  Ht 5\' 6"  (1.676 m)  Wt 188 lb 4 oz (85.39 kg)  BMI 30.40 kg/m2  SpO2 98%    GEN: WDWN, NAD, Non-toxic, Alert & Oriented x 3 HEENT: Atraumatic, Normocephalic.  Ears and Nose: No external deformity. EXTR: No clubbing/cyanosis/edema NEURO: Normal gait.  PSYCH: Normally interactive. Conversant. Not depressed or anxious appearing.  Calm demeanor.   R hand Ecchymosis or edema: neg ROM wrist/hand/digits: full  Carpals, MCP's, digits: NT Distal Ulna and Radius: NT Ecchymosis or edema: neg No instability Cysts/nodules: 4TH Digit triggering: 4TH ON THE 3RD THERE IS FLEXOR TENOSYNOVITIS Finkelstein's test: neg Snuffbox tenderness: neg Scaphoid tubercle: NT Resisted supination: NT Full composite fist, no malrotation Grip, all digits: 5/5 str DIPJT: NT PIP JT: NT MCP JT: NT Axial load test: neg Phalen's: neg Tinel's: pos on R Atrophy: neg  Hand sensation: post tinel's mildly diminished 1-3 distally on R  Radiology: No results found.  Assessment & Plan:   1. Trigger finger, R 4th 2. CTS, mildly provocable R hand 3. Tenosynovitis R 3rd flexor tendon, question early trigger finger  Recommendations: 1. R 4th trigger finger injection 2. R 3rd flexor tendon sheath injection 3. Cock-up wrist splint at night for CTS. If more symptomatic then consider CTS injection vs EMG/NCV to define degree of impairment.  Trigger Finger Injection, R 4th Verbal  consent was obtained. Risks (including rare risk of infection, potential risk for skin lightening and potential atrophy), benefits and alternatives were discussed. Prepped with Chloraprep and Ethyl Chloride used for anesthesia. Under sterile conditions, patient injected at palmar crease aiming distally with 45 degree angle towards nodule; injected directly into tendon sheath. Medication flowed freely without resistance.  Needle size: 22 gauge 1 1/2 inch Injection: 1/2 cc of Lidocaine 1% and Depo-Medrol 20 mg   3rd R tendon sheath Injection, indication: flexor tenosynovitis, 3rd digit  Verbal consent was obtained. Risks (including rare risk of infection, potential  risk for skin lightening and potential atrophy), benefits and alternatives were discussed. Prepped with Chloraprep and Ethyl Chloride used for anesthesia. Under sterile conditions, patient injected at palmar crease aiming distally with 45 degree angle towards MCP head; injected directly into tendon sheath. Medication flowed freely without resistance.  Needle size: 22 gauge 1 1/2 inch Injection: 1/2 cc of Lidocaine 1% and Depo-Medrol 20 mg   We will see the patient back prn if symptoms return.  Thank you for having [name] see Carla Mccoy in consultation.  Feel free to contact me with any questions.  Signed,  Signed,  Elpidio Galea. Tammela Bales, MD, [facility] Sports Medicine   Discontinued Medications   No medications on file   Current Medications at Discharge:   Medication List       This list is accurate as of: 11/02/13 11:59 PM.  Always use your most recent med list.               levothyroxine 175 MCG tablet  Commonly known as:  SYNTHROID, LEVOTHROID  TAKE ONE TABLET BY MOUTH ONCE DAILY     multivitamin,tx-minerals tablet  Take 1 tablet by mouth daily.     OMEGA 3 PO  Take 1 capsule by mouth daily.     Vitamin D3 2000 UNITS Tabs  Take by mouth.     vitamin E 100 UNIT capsule  Take 100 Units by mouth daily.

## 2013-11-02 NOTE — Progress Notes (Signed)
Pre visit review using our clinic review tool, if applicable. No additional management support is needed unless otherwise documented below in the visit note. 

## 2013-12-05 ENCOUNTER — Encounter (INDEPENDENT_AMBULATORY_CARE_PROVIDER_SITE_OTHER): Payer: Self-pay | Admitting: Surgery

## 2013-12-05 ENCOUNTER — Ambulatory Visit (INDEPENDENT_AMBULATORY_CARE_PROVIDER_SITE_OTHER): Payer: BC Managed Care – PPO | Admitting: Surgery

## 2013-12-05 VITALS — BP 128/74 | Ht 66.5 in | Wt 187.8 lb

## 2013-12-05 DIAGNOSIS — S21002S Unspecified open wound of left breast, sequela: Secondary | ICD-10-CM

## 2013-12-05 DIAGNOSIS — IMO0002 Reserved for concepts with insufficient information to code with codable children: Secondary | ICD-10-CM

## 2013-12-05 NOTE — Progress Notes (Signed)
Patient returns 8 weeks at the left breast debridement. 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 She is changing her dressings at home with the help of her daughter. She is doing well. She came in today for wound check to his drainage. Developed trigger finger on right  Exam: Left breast wound is 3 cm x 3 cm x 3 cm. Minimal fibrinous exudate. No erythema.  80% granulated. Small amount of dead tissue debrided today   Impression: Status post debridement left breast 2 3/4 months ago s/p lumpectomy and radiation therapy   Plan: Continue wet-to-dry dressing changes. There has been little change over last month.  Will refer to plastic surgery for opinion and possible other treatment options.

## 2013-12-05 NOTE — Patient Instructions (Signed)
Will refer to plastic surgery to look at wound.  Continue present care.

## 2013-12-14 ENCOUNTER — Other Ambulatory Visit (HOSPITAL_BASED_OUTPATIENT_CLINIC_OR_DEPARTMENT_OTHER): Payer: BC Managed Care – PPO

## 2013-12-14 DIAGNOSIS — C50419 Malignant neoplasm of upper-outer quadrant of unspecified female breast: Secondary | ICD-10-CM

## 2013-12-14 DIAGNOSIS — C50912 Malignant neoplasm of unspecified site of left female breast: Secondary | ICD-10-CM

## 2013-12-14 DIAGNOSIS — Z853 Personal history of malignant neoplasm of breast: Secondary | ICD-10-CM

## 2013-12-14 DIAGNOSIS — Z780 Asymptomatic menopausal state: Secondary | ICD-10-CM

## 2013-12-14 LAB — COMPREHENSIVE METABOLIC PANEL
ALK PHOS: 86 U/L (ref 39–117)
ALT: 13 U/L (ref 0–35)
AST: 19 U/L (ref 0–37)
Albumin: 3.6 g/dL (ref 3.5–5.2)
BILIRUBIN TOTAL: 0.5 mg/dL (ref 0.2–1.2)
BUN: 13 mg/dL (ref 6–23)
CHLORIDE: 100 meq/L (ref 96–112)
CO2: 25 mEq/L (ref 19–32)
CREATININE: 0.84 mg/dL (ref 0.50–1.10)
Calcium: 9.8 mg/dL (ref 8.4–10.5)
Glucose, Bld: 203 mg/dL — ABNORMAL HIGH (ref 70–99)
Potassium: 4.1 mEq/L (ref 3.5–5.3)
Sodium: 138 mEq/L (ref 135–145)
Total Protein: 7.5 g/dL (ref 6.0–8.3)

## 2013-12-14 LAB — CBC WITH DIFFERENTIAL/PLATELET
BASO%: 0.4 % (ref 0.0–2.0)
BASOS ABS: 0 10*3/uL (ref 0.0–0.1)
EOS%: 3.3 % (ref 0.0–7.0)
Eosinophils Absolute: 0.3 10*3/uL (ref 0.0–0.5)
HEMATOCRIT: 42.8 % (ref 34.8–46.6)
HEMOGLOBIN: 14.4 g/dL (ref 11.6–15.9)
LYMPH%: 29.6 % (ref 14.0–49.7)
MCH: 29.8 pg (ref 25.1–34.0)
MCHC: 33.6 g/dL (ref 31.5–36.0)
MCV: 88.4 fL (ref 79.5–101.0)
MONO#: 0.7 10*3/uL (ref 0.1–0.9)
MONO%: 8 % (ref 0.0–14.0)
NEUT#: 5.3 10*3/uL (ref 1.5–6.5)
NEUT%: 58.7 % (ref 38.4–76.8)
Platelets: 269 10*3/uL (ref 145–400)
RBC: 4.84 10*6/uL (ref 3.70–5.45)
RDW: 12.9 % (ref 11.2–14.5)
WBC: 9 10*3/uL (ref 3.9–10.3)
lymph#: 2.7 10*3/uL (ref 0.9–3.3)

## 2013-12-19 ENCOUNTER — Telehealth: Payer: Self-pay | Admitting: *Deleted

## 2013-12-19 ENCOUNTER — Other Ambulatory Visit: Payer: Self-pay | Admitting: *Deleted

## 2013-12-19 ENCOUNTER — Encounter (HOSPITAL_BASED_OUTPATIENT_CLINIC_OR_DEPARTMENT_OTHER): Payer: Self-pay | Admitting: *Deleted

## 2013-12-19 NOTE — Progress Notes (Signed)
No labs needed

## 2013-12-19 NOTE — Telephone Encounter (Signed)
This RN spoke with pt per her call stating need to cancel appointment this Wednesday with Dr Jannifer Rodney due to scheduled surgery.  Per discussion noted pt was scheduled to be seen in April of this year but due to ongoing wound issues secondary to lumpectomy pt has been under active care with Dr Brantley Stage.  Presently due to nonhealing wound - pt has been referred to Dr Migdalia Dk.  Per discussion this RN informed [facility] records per above is available for MD review per [facility] records-   Plan is to cancel appt 8/19 with Dr Jannifer Rodney and reschedule to September.

## 2013-12-21 ENCOUNTER — Ambulatory Visit: Payer: Self-pay | Admitting: Oncology

## 2013-12-21 ENCOUNTER — Ambulatory Visit (HOSPITAL_BASED_OUTPATIENT_CLINIC_OR_DEPARTMENT_OTHER): Payer: BC Managed Care – PPO | Admitting: Anesthesiology

## 2013-12-21 ENCOUNTER — Encounter (HOSPITAL_BASED_OUTPATIENT_CLINIC_OR_DEPARTMENT_OTHER): Payer: BC Managed Care – PPO | Admitting: Anesthesiology

## 2013-12-21 ENCOUNTER — Telehealth: Payer: Self-pay | Admitting: Oncology

## 2013-12-21 ENCOUNTER — Encounter (HOSPITAL_BASED_OUTPATIENT_CLINIC_OR_DEPARTMENT_OTHER)
Admission: RE | Disposition: A | Discharge status: Home / Self Care | Payer: Self-pay | Source: Ambulatory Visit | Attending: Plastic Surgery

## 2013-12-21 ENCOUNTER — Encounter (HOSPITAL_BASED_OUTPATIENT_CLINIC_OR_DEPARTMENT_OTHER): Payer: Self-pay | Admitting: Plastic Surgery

## 2013-12-21 ENCOUNTER — Ambulatory Visit (HOSPITAL_BASED_OUTPATIENT_CLINIC_OR_DEPARTMENT_OTHER)
Admission: RE | Admit: 2013-12-21 | Discharge: 2013-12-21 | Disposition: A | Discharge status: Home / Self Care | Payer: BC Managed Care – PPO | Source: Ambulatory Visit | Attending: Plastic Surgery | Admitting: Plastic Surgery

## 2013-12-21 DIAGNOSIS — E669 Obesity, unspecified: Secondary | ICD-10-CM | POA: Diagnosis not present

## 2013-12-21 DIAGNOSIS — Z888 Allergy status to other drugs, medicaments and biological substances status: Secondary | ICD-10-CM | POA: Insufficient documentation

## 2013-12-21 DIAGNOSIS — Z882 Allergy status to sulfonamides status: Secondary | ICD-10-CM | POA: Insufficient documentation

## 2013-12-21 DIAGNOSIS — N61 Mastitis without abscess: Secondary | ICD-10-CM | POA: Diagnosis not present

## 2013-12-21 DIAGNOSIS — E039 Hypothyroidism, unspecified: Secondary | ICD-10-CM | POA: Diagnosis not present

## 2013-12-21 DIAGNOSIS — Z853 Personal history of malignant neoplasm of breast: Secondary | ICD-10-CM | POA: Insufficient documentation

## 2013-12-21 DIAGNOSIS — Z90710 Acquired absence of both cervix and uterus: Secondary | ICD-10-CM | POA: Diagnosis not present

## 2013-12-21 DIAGNOSIS — I10 Essential (primary) hypertension: Secondary | ICD-10-CM | POA: Insufficient documentation

## 2013-12-21 HISTORY — DX: Complete loss of teeth, unspecified cause, unspecified class: Z97.2

## 2013-12-21 HISTORY — DX: Presence of spectacles and contact lenses: Z97.3

## 2013-12-21 HISTORY — DX: Complete loss of teeth, unspecified cause, unspecified class: K08.109

## 2013-12-21 HISTORY — PX: INCISION AND DRAINAGE OF WOUND: SHX1803

## 2013-12-21 SURGERY — IRRIGATION AND DEBRIDEMENT WOUND
Anesthesia: General | Site: Breast | Laterality: Left

## 2013-12-21 MED ORDER — FENTANYL CITRATE 0.05 MG/ML IJ SOLN
INTRAMUSCULAR | Status: AC
  Filled 2013-12-21: qty 6

## 2013-12-21 MED ORDER — FENTANYL CITRATE 0.05 MG/ML IJ SOLN
INTRAMUSCULAR | Status: DC | PRN
  Administered 2013-12-21: 100 ug via INTRAVENOUS
  Administered 2013-12-21: 50 ug via INTRAVENOUS

## 2013-12-21 MED ORDER — MIDAZOLAM HCL 2 MG/2ML IJ SOLN: 1.0000 mg | INTRAMUSCULAR | Status: DC | PRN

## 2013-12-21 MED ORDER — MIDAZOLAM HCL 5 MG/5ML IJ SOLN
INTRAMUSCULAR | Status: DC | PRN
  Administered 2013-12-21: 2 mg via INTRAVENOUS

## 2013-12-21 MED ORDER — CEFAZOLIN SODIUM-DEXTROSE 2-3 GM-% IV SOLR
INTRAVENOUS | Status: AC
  Filled 2013-12-21: qty 50

## 2013-12-21 MED ORDER — DEXAMETHASONE SODIUM PHOSPHATE 4 MG/ML IJ SOLN
INTRAMUSCULAR | Status: DC | PRN
  Administered 2013-12-21: 10 mg via INTRAVENOUS

## 2013-12-21 MED ORDER — LACTATED RINGERS IV SOLN
INTRAVENOUS | Status: DC
  Administered 2013-12-21 (×2): via INTRAVENOUS

## 2013-12-21 MED ORDER — BUPIVACAINE-EPINEPHRINE (PF) 0.25% -1:200000 IJ SOLN
INTRAMUSCULAR | Status: DC | PRN
  Administered 2013-12-21: 5.5 mL via PERINEURAL

## 2013-12-21 MED ORDER — HYDROMORPHONE HCL PF 1 MG/ML IJ SOLN
0.2500 mg | INTRAMUSCULAR | Status: DC | PRN
  Administered 2013-12-21 (×4): 0.25 mg via INTRAVENOUS

## 2013-12-21 MED ORDER — HYDROCODONE-ACETAMINOPHEN 5-325 MG PO TABS
1.0000 | ORAL_TABLET | Freq: Four times a day (QID) | ORAL | Status: DC | PRN
Start: 2013-12-21 — End: 2015-03-26

## 2013-12-21 MED ORDER — SODIUM CHLORIDE 0.9 % IR SOLN
Status: DC | PRN
  Administered 2013-12-21: 12:00:00

## 2013-12-21 MED ORDER — HYDROMORPHONE HCL PF 1 MG/ML IJ SOLN
INTRAMUSCULAR | Status: AC
  Filled 2013-12-21: qty 1

## 2013-12-21 MED ORDER — OXYCODONE HCL 5 MG PO TABS
5.0000 mg | ORAL_TABLET | Freq: Once | ORAL | Status: AC | PRN
  Administered 2013-12-21: 5 mg via ORAL

## 2013-12-21 MED ORDER — SCOPOLAMINE 1 MG/3DAYS TD PT72
1.0000 | MEDICATED_PATCH | Freq: Once | TRANSDERMAL | Status: DC
  Administered 2013-12-21: 1.5 mg via TRANSDERMAL

## 2013-12-21 MED ORDER — SCOPOLAMINE 1 MG/3DAYS TD PT72
MEDICATED_PATCH | TRANSDERMAL | Status: AC
  Filled 2013-12-21: qty 1

## 2013-12-21 MED ORDER — PROPOFOL 10 MG/ML IV BOLUS
INTRAVENOUS | Status: DC | PRN
  Administered 2013-12-21: 200 mg via INTRAVENOUS

## 2013-12-21 MED ORDER — ONDANSETRON HCL 4 MG/2ML IJ SOLN
INTRAMUSCULAR | Status: DC | PRN
  Administered 2013-12-21: 4 mg via INTRAVENOUS

## 2013-12-21 MED ORDER — OXYCODONE HCL 5 MG PO TABS
ORAL_TABLET | ORAL | Status: AC
  Filled 2013-12-21: qty 1

## 2013-12-21 MED ORDER — OXYCODONE HCL 5 MG/5ML PO SOLN: 5.0000 mg | Freq: Once | ORAL | Status: AC | PRN

## 2013-12-21 MED ORDER — MIDAZOLAM HCL 2 MG/2ML IJ SOLN
INTRAMUSCULAR | Status: AC
  Filled 2013-12-21: qty 2

## 2013-12-21 MED ORDER — FENTANYL CITRATE 0.05 MG/ML IJ SOLN
50.0000 ug | INTRAMUSCULAR | Status: DC | PRN
Start: 2013-12-21 — End: 2013-12-21

## 2013-12-21 MED ORDER — LIDOCAINE HCL (CARDIAC) 20 MG/ML IV SOLN
INTRAVENOUS | Status: DC | PRN
  Administered 2013-12-21: 50 mg via INTRAVENOUS

## 2013-12-21 SURGICAL SUPPLY — 72 items
BAG DECANTER FOR FLEXI CONT (MISCELLANEOUS) IMPLANT
BENZOIN TINCTURE PRP APPL 2/3 (GAUZE/BANDAGES/DRESSINGS) IMPLANT
BINDER BREAST XLRG (GAUZE/BANDAGES/DRESSINGS) ×3 IMPLANT
BLADE HEX COATED 2.75 (ELECTRODE) IMPLANT
BLADE SURG 10 STRL SS (BLADE) IMPLANT
BLADE SURG 15 STRL LF DISP TIS (BLADE) ×1 IMPLANT
BLADE SURG 15 STRL SS (BLADE) ×2
CANISTER SUCT 1200ML W/VALVE (MISCELLANEOUS) IMPLANT
CANISTER SUCT 3000ML (MISCELLANEOUS) IMPLANT
CHLORAPREP W/TINT 26ML (MISCELLANEOUS) IMPLANT
CLOSURE WOUND 1/2 X4 (GAUZE/BANDAGES/DRESSINGS)
COVER MAYO STAND STRL (DRAPES) ×3 IMPLANT
COVER TABLE BACK 60X90 (DRAPES) ×3 IMPLANT
DECANTER SPIKE VIAL GLASS SM (MISCELLANEOUS) IMPLANT
DRAIN CHANNEL 19F RND (DRAIN) IMPLANT
DRAIN PENROSE 1/2X12 LTX STRL (WOUND CARE) IMPLANT
DRAPE INCISE IOBAN 66X45 STRL (DRAPES) ×3 IMPLANT
DRAPE LAPAROSCOPIC ABDOMINAL (DRAPES) IMPLANT
DRAPE PED LAPAROTOMY (DRAPES) ×3 IMPLANT
DRSG ADAPTIC 3X8 NADH LF (GAUZE/BANDAGES/DRESSINGS) ×3 IMPLANT
DRSG EMULSION OIL 3X3 NADH (GAUZE/BANDAGES/DRESSINGS) ×3 IMPLANT
DRSG PAD ABDOMINAL 8X10 ST (GAUZE/BANDAGES/DRESSINGS) IMPLANT
ELECT REM PT RETURN 9FT ADLT (ELECTROSURGICAL) ×3
ELECTRODE REM PT RTRN 9FT ADLT (ELECTROSURGICAL) ×1 IMPLANT
EVACUATOR SILICONE 100CC (DRAIN) IMPLANT
GAUZE SPONGE 4X4 12PLY STRL (GAUZE/BANDAGES/DRESSINGS) ×3 IMPLANT
GAUZE XEROFORM 1X8 LF (GAUZE/BANDAGES/DRESSINGS) IMPLANT
GAUZE XEROFORM 5X9 LF (GAUZE/BANDAGES/DRESSINGS) IMPLANT
GLOVE BIO SURGEON STRL SZ 6.5 (GLOVE) ×4 IMPLANT
GLOVE BIO SURGEONS STRL SZ 6.5 (GLOVE) ×2
GLOVE SURG SS PI 7.0 STRL IVOR (GLOVE) ×3 IMPLANT
GOWN STRL REUS W/ TWL LRG LVL3 (GOWN DISPOSABLE) ×3 IMPLANT
GOWN STRL REUS W/TWL LRG LVL3 (GOWN DISPOSABLE) ×6
HANDPIECE INTERPULSE COAX TIP (DISPOSABLE)
IV NS IRRIG 3000ML ARTHROMATIC (IV SOLUTION) IMPLANT
MANIFOLD NEPTUNE II (INSTRUMENTS) IMPLANT
MATRIX SURGICAL PSM 7X10CM (Tissue) ×3 IMPLANT
MICROMATRIX 500MG (Tissue) ×3 IMPLANT
NEEDLE HYPO 25X1 1.5 SAFETY (NEEDLE) ×3 IMPLANT
NS IRRIG 1000ML POUR BTL (IV SOLUTION) ×3 IMPLANT
PACK BASIN DAY SURGERY FS (CUSTOM PROCEDURE TRAY) ×3 IMPLANT
PENCIL BUTTON HOLSTER BLD 10FT (ELECTRODE) ×3 IMPLANT
PIN SAFETY STERILE (MISCELLANEOUS) IMPLANT
SET HNDPC FAN SPRY TIP SCT (DISPOSABLE) IMPLANT
SHEET MEDIUM DRAPE 40X70 STRL (DRAPES) IMPLANT
SLEEVE SCD COMPRESS KNEE MED (MISCELLANEOUS) ×3 IMPLANT
SOLUTION PARTIC MCRMTRX 500MG (Tissue) ×1 IMPLANT
SPONGE GAUZE 4X4 12PLY STER LF (GAUZE/BANDAGES/DRESSINGS) IMPLANT
SPONGE LAP 18X18 X RAY DECT (DISPOSABLE) ×3 IMPLANT
STAPLER VISISTAT 35W (STAPLE) IMPLANT
STRIP CLOSURE SKIN 1/2X4 (GAUZE/BANDAGES/DRESSINGS) IMPLANT
SUCTION FRAZIER TIP 10 FR DISP (SUCTIONS) IMPLANT
SURGILUBE 2OZ TUBE FLIPTOP (MISCELLANEOUS) ×3 IMPLANT
SUT MNCRL AB 4-0 PS2 18 (SUTURE) ×3 IMPLANT
SUT MON AB 3-0 SH 27 (SUTURE)
SUT MON AB 3-0 SH27 (SUTURE) IMPLANT
SUT SILK 3 0 PS 1 (SUTURE) IMPLANT
SUT VIC AB 3-0 FS2 27 (SUTURE) IMPLANT
SUT VIC AB 5-0 PS2 18 (SUTURE) ×6 IMPLANT
SUT VICRYL 4-0 PS2 18IN ABS (SUTURE) IMPLANT
SWAB COLLECTION DEVICE MRSA (MISCELLANEOUS) IMPLANT
SYR BULB IRRIGATION 50ML (SYRINGE) ×3 IMPLANT
SYR CONTROL 10ML LL (SYRINGE) ×3 IMPLANT
TAPE HYPAFIX 6 X30' (GAUZE/BANDAGES/DRESSINGS)
TAPE HYPAFIX 6X30 (GAUZE/BANDAGES/DRESSINGS) IMPLANT
TOWEL OR 17X24 6PK STRL BLUE (TOWEL DISPOSABLE) ×3 IMPLANT
TRAY DSU PREP LF (CUSTOM PROCEDURE TRAY) ×3 IMPLANT
TUBE ANAEROBIC SPECIMEN COL (MISCELLANEOUS) IMPLANT
TUBE CONNECTING 20'X1/4 (TUBING) ×1
TUBE CONNECTING 20X1/4 (TUBING) ×2 IMPLANT
UNDERPAD 30X30 INCONTINENT (UNDERPADS AND DIAPERS) ×3 IMPLANT
YANKAUER SUCT BULB TIP NO VENT (SUCTIONS) ×3 IMPLANT

## 2013-12-21 NOTE — Brief Op Note (Signed)
12/21/2013  12:11 PM  PATIENT:  Carla Mccoy  63 y.o. female  PRE-OPERATIVE DIAGNOSIS:  wound of left breast with history of breast cancer  POST-OPERATIVE DIAGNOSIS:  wound of left breast with history of breast cancer  PROCEDURE:  Procedure(s): DEBRIDEMENT OF LEFT BREAST WOUND WITH PLACEMENT OF A CELL (Left)  SURGEON:  Surgeon(s) and Role:    * Claire Sanger, DO - Primary   PHYSICIAN ASSISTANT: Shawn Rayburn, PA  ASSISTANTS: none   ANESTHESIA:   local and general  EBL:  Total I/O In: 1000 [I.V.:1000] Out: -   BLOOD ADMINISTERED:none  DRAINS: none   LOCAL MEDICATIONS USED:  MARCAINE     SPECIMEN:  Source of Specimen:  left breast  DISPOSITION OF SPECIMEN:  PATHOLOGY  COUNTS:  YES  TOURNIQUET:  * No tourniquets in log *  DICTATION: .Dragon Dictation  PLAN OF CARE: Discharge to home after PACU  PATIENT DISPOSITION:  PACU - hemodynamically stable.   Delay start of Pharmacological VTE agent (>24hrs) due to surgical blood loss or risk of bleeding: no

## 2013-12-21 NOTE — Discharge Instructions (Signed)
Place KY gel on the gauze daily starting on Friday   Post Anesthesia Home Care Instructions  Activity: Get plenty of rest for the remainder of the day. A responsible adult should stay with you for 24 hours following the procedure.  For the next 24 hours, DO NOT: -Drive a car -Paediatric nurse -Drink alcoholic beverages -Take any medication unless instructed by your physician -Make any legal decisions or sign important papers.  Meals: Start with liquid foods such as gelatin or soup. Progress to regular foods as tolerated. Avoid greasy, spicy, heavy foods. If nausea and/or vomiting occur, drink only clear liquids until the nausea and/or vomiting subsides. Call your physician if vomiting continues.  Special Instructions/Symptoms: Your throat may feel dry or sore from the anesthesia or the breathing tube placed in your throat during surgery. If this causes discomfort, gargle with warm salt water. The discomfort should disappear within 24 hours.  Call your surgeon if you experience:   1.  Fever over 101.0. 2.  Inability to urinate. 3.  Nausea and/or vomiting. 4.  Extreme swelling or bruising at the surgical site. 5.  Continued bleeding from the incision. 6.  Increased pain, redness or drainage from the incision. 7.  Problems related to your pain medication. 8. Any change in color, movement and/or sensation 9. Any problems and/or concerns

## 2013-12-21 NOTE — Anesthesia Procedure Notes (Signed)
Procedure Name: LMA Insertion Date/Time: 12/21/2013 11:31 AM Performed by: Melynda Ripple D Pre-anesthesia Checklist: Patient identified, Emergency Drugs available, Suction available and Patient being monitored Patient Re-evaluated:Patient Re-evaluated prior to inductionOxygen Delivery Method: Circle System Utilized Preoxygenation: Pre-oxygenation with 100% oxygen Intubation Type: IV induction Ventilation: Mask ventilation without difficulty LMA: LMA inserted LMA Size: 4.0 Number of attempts: 1 Airway Equipment and Method: bite block Placement Confirmation: positive ETCO2 Tube secured with: Tape Dental Injury: Teeth and Oropharynx as per pre-operative assessment

## 2013-12-21 NOTE — Op Note (Signed)
Operative Note   DATE OF OPERATION: 12/21/2013  LOCATION: [place]  SURGICAL DIVISION: Plastic Surgery  PREOPERATIVE DIAGNOSES:  Left breast chronic ulcer (3.5 x 4 x 6 cm )  POSTOPERATIVE DIAGNOSES:  same  PROCEDURE:  Left breast debridement for preparation for placement of Acell (powder 500 mg and sheet 7 x 10 cm)  SURGEON: Theodoro Kos, DO  ASSISTANT: Shawn Rayburn, PA  ANESTHESIA:  General.   COMPLICATIONS: None.   INDICATIONS FOR PROCEDURE:  The patient, Carla Mccoy is a 63 y.o. female born on 08/01/1950, is here for treatment of a chronic left breast wound. MRN: [id number]  CONSENT:  Informed consent was obtained directly from the patient. Risks, benefits and alternatives were fully discussed. Specific risks including but not limited to bleeding, infection, hematoma, seroma, scarring, pain, infection, contracture, asymmetry, wound healing problems, and need for further surgery were all discussed. The patient did have an ample opportunity to have questions answered to satisfaction.   DESCRIPTION OF PROCEDURE:  The patient was taken to the operating room. SCDs were placed and IV antibiotics were given. The patient's operative site was prepped and draped in a sterile fashion. A time out was performed and all information was confirmed to be correct.  General anesthesia was administered.  The area was marked with a purple marker.  The area was then excised.  Hemostasis was achieved with electrocautery.  The Acell powder and sheet were applied.  Adaptic was placed followed by gauze and surgical lube.   The patient tolerated the procedure well.  There were no complications. The patient was allowed to wake from anesthesia, extubated and taken to the recovery room in satisfactory condition.

## 2013-12-21 NOTE — Anesthesia Preprocedure Evaluation (Signed)
Anesthesia Evaluation  Patient identified by MRN, date of birth, ID band Patient awake    Reviewed: Allergy & Precautions, H&P , NPO status , Patient's Chart, lab work & pertinent test results  Airway Mallampati: II TM Distance: >3 FB Neck ROM: Full    Dental no notable dental hx. (+) Upper Dentures, Lower Dentures, Dental Advisory Given   Pulmonary neg pulmonary ROS,  breath sounds clear to auscultation  Pulmonary exam normal       Cardiovascular hypertension, Rhythm:Regular Rate:Normal     Neuro/Psych  Headaches, negative psych ROS   GI/Hepatic negative GI ROS, Neg liver ROS,   Endo/Other  Hypothyroidism   Renal/GU negative Renal ROS  negative genitourinary   Musculoskeletal   Abdominal   Peds  Hematology negative hematology ROS (+)   Anesthesia Other Findings   Reproductive/Obstetrics negative OB ROS                           Anesthesia Physical Anesthesia Plan  ASA: II  Anesthesia Plan: General   Post-op Pain Management:    Induction: Intravenous  Airway Management Planned: LMA  Additional Equipment:   Intra-op Plan:   Post-operative Plan: Extubation in OR  Informed Consent: I have reviewed the patients History and Physical, chart, labs and discussed the procedure including the risks, benefits and alternatives for the proposed anesthesia with the patient or authorized representative who has indicated his/her understanding and acceptance.   Dental advisory given  Plan Discussed with: CRNA  Anesthesia Plan Comments:         Anesthesia Quick Evaluation

## 2013-12-21 NOTE — Transfer of Care (Signed)
Immediate Anesthesia Transfer of Care Note  Patient: Carla Mccoy  Procedure(s) Performed: Procedure(s): DEBRIDEMENT OF LEFT BREAST WOUND WITH PLACEMENT OF A CELL (Left)  Patient Location: PACU  Anesthesia Type:General  Level of Consciousness: sedated  Airway & Oxygen Therapy: Patient Spontanous Breathing and Patient connected to face mask oxygen  Post-op Assessment: Report given to PACU RN and Post -op Vital signs reviewed and stable  Post vital signs: Reviewed and stable  Complications: No apparent anesthesia complications

## 2013-12-21 NOTE — Telephone Encounter (Signed)
Called and s/w pt to r/s 8/19 apt to late Sept per pof. Pt asked for an appt in Aug for insurance purposes. Gave appt  8/24.

## 2013-12-21 NOTE — H&P (Signed)
Carla Mccoy is an 63 y.o. female.   Chief Complaint: breast ulcer HPI: The patient is a 89 yrs wf here for evaluation of an ulcer on her left breast. She was treated for breast cancer with a lumpectomy and radiation. She developed a mastitis with an abscess area at the 12 o'clock position of the left breast. It is close to the NAC. It has some mild fibrous tissue. It is 3 x 3 x 2 cm in size. It does not look like it is infected. She has been dealing with this for some time and would like to have it closed.  Past Medical History  Diagnosis Date  . Tachy-brady syndrome   . Backache, unspecified   . Personal history of peptic ulcer disease   . 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 Other and unspecified hyperlipidemia   . Other abnormal glucose   . Obesity, unspecified   . 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 Other chronic nonalcoholic liver disease   . Migraine without aura, without mention of intractable migraine without mention of status migrainosus   . Unspecified hypothyroidism   . breast ca dx'd 06/2009    breast cancer left - lumpectomy  . Unspecified essential hypertension 09/12/13    remote history; no current treatment  . Full dentures   . Wears glasses     Past Surgical History  Procedure Laterality Date  . Laparoscopy abdomen diagnostic    . Tonsillectomy    . Breast lumpectomy Left     Feb '11  . Breast biopsy Left     in the 80's - benign tissue  . Abdominal hysterectomy  1988  . Tubal ligation  1971  . Incision and drainage of wound Left 09/14/2013    Procedure: IRRIGATION AND DEBRIDEMENT LEFT BREAST ABSCESS;  Surgeon: Marcello Moores A. Cornett, MD;  Location: [facility];  Service: General;  Laterality: Left;    Family History  Problem Relation Age of Onset  . Peripheral vascular disease Mother   . Coronary artery disease Mother   . Hyperlipidemia Mother   . Heart disease Mother    CAD/fatal MI  . Diabetes Mother   . Stroke Mother   . Diabetes Sister   . Hypothyroidism Sister   . Diabetes Brother   . Hyperlipidemia Brother   . Heart disease Brother    Social History:  reports that she has never smoked. She has never used smokeless tobacco. She reports that she does not drink alcohol or use illicit drugs.  Allergies:  Allergies  Allergen Reactions  . Metoprolol     Lips swelled/ hands swelled and her skin turned red   . Sulfonamide Derivatives     No prescriptions prior to admission    No results found for this or any previous visit (from the past 48 hour(s)). No results found.  Review of Systems  Constitutional: Negative.   HENT: Negative.   Eyes: Negative.   Respiratory: Negative.   Cardiovascular: Negative.   Gastrointestinal: Negative.   Genitourinary: Negative.   Musculoskeletal: Negative.   Skin: Negative.   Neurological: Negative.   Psychiatric/Behavioral: Negative.     Height 5' 6.5" (1.689 m), weight 83.915 kg (185 lb). Physical Exam  Constitutional: She is oriented to person, place, and time. She appears well-developed and well-nourished.  HENT:  Head: Normocephalic and atraumatic.  Eyes: Pupils are equal, round, and reactive to light.  Cardiovascular: Normal rate.   Respiratory: Effort normal.  GI: Soft.  Neurological: She is alert and oriented to person, place, and time.  Skin: Skin is warm.  Psychiatric: She has a normal mood and affect. Her behavior is normal. Judgment and thought content normal.     Assessment/Plan Debridment of breast ulcer with placement of Acell.  SANGER,Carey Lafon 12/21/2013, 8:01 AM

## 2013-12-21 NOTE — Anesthesia Postprocedure Evaluation (Signed)
  Anesthesia Post-op Note  Patient: Carla Mccoy  Procedure(s) Performed: Procedure(s): DEBRIDEMENT OF LEFT BREAST WOUND WITH PLACEMENT OF A CELL (Left)  Patient Location: PACU  Anesthesia Type:General  Level of Consciousness: awake and alert   Airway and Oxygen Therapy: Patient Spontanous Breathing  Post-op Pain: mild  Post-op Assessment: Post-op Vital signs reviewed, Patient's Cardiovascular Status Stable and Respiratory Function Stable  Post-op Vital Signs: Reviewed  Filed Vitals:   12/21/13 1300  BP: 120/69  Pulse: 81  Temp:   Resp: 15    Complications: No apparent anesthesia complications

## 2013-12-22 LAB — POCT HEMOGLOBIN-HEMACUE: HEMOGLOBIN: 15.6 g/dL — AB (ref 12.0–15.0)

## 2013-12-26 ENCOUNTER — Telehealth: Payer: Self-pay | Admitting: *Deleted

## 2013-12-26 ENCOUNTER — Encounter: Payer: Self-pay | Admitting: Adult Health

## 2013-12-26 ENCOUNTER — Ambulatory Visit (HOSPITAL_BASED_OUTPATIENT_CLINIC_OR_DEPARTMENT_OTHER): Payer: BC Managed Care – PPO | Admitting: Adult Health

## 2013-12-26 VITALS — BP 145/70 | HR 96 | Temp 98.1°F | Resp 18 | Ht 66.5 in | Wt 184.5 lb

## 2013-12-26 DIAGNOSIS — C50912 Malignant neoplasm of unspecified site of left female breast: Secondary | ICD-10-CM

## 2013-12-26 DIAGNOSIS — R7309 Other abnormal glucose: Secondary | ICD-10-CM

## 2013-12-26 DIAGNOSIS — Z853 Personal history of malignant neoplasm of breast: Secondary | ICD-10-CM

## 2013-12-26 NOTE — Patient Instructions (Signed)
I recommend healthy diet, exercise and monthly breast exams.  We will see you back in 6 months.  Please reconsider restarting Tamoxifen.    Breast Self-Awareness Practicing breast self-awareness may pick up problems early, prevent significant medical complications, and possibly save your life. By practicing breast self-awareness, you can become familiar with how your breasts look and feel and if your breasts are changing. This allows you to notice changes early. It can also offer you some reassurance that your breast health is good. One way to learn what is normal for your breasts and whether your breasts are changing is to do a breast self-exam. If you find a lump or something that was not present in the past, it is best to contact your caregiver right away. Other findings that should be evaluated by your caregiver include nipple discharge, especially if it is bloody; skin changes or reddening; areas where the skin seems to be pulled in (retracted); or new lumps and bumps. Breast pain is seldom associated with cancer (malignancy), but should also be evaluated by a caregiver. HOW TO PERFORM A BREAST SELF-EXAM The best time to examine your breasts is 5-7 days after your menstrual period is over. During menstruation, the breasts are lumpier, and it may be more difficult to pick up changes. If you do not menstruate, have reached menopause, or had your uterus removed (hysterectomy), you should examine your breasts at regular intervals, such as monthly. If you are breastfeeding, examine your breasts after a feeding or after using a breast pump. Breast implants do not decrease the risk for lumps or tumors, so continue to perform breast self-exams as recommended. Talk to your caregiver about how to determine the difference between the implant and breast tissue. Also, talk about the amount of pressure you should use during the exam. Over time, you will become more familiar with the variations of your breasts and more  comfortable with the exam. A breast self-exam requires you to remove all your clothes above the waist. 1. Look at your breasts and nipples. Stand in front of a mirror in a room with good lighting. With your hands on your hips, push your hands firmly downward. Look for a difference in shape, contour, and size from one breast to the other (asymmetry). Asymmetry includes puckers, dips, or bumps. Also, look for skin changes, such as reddened or scaly areas on the breasts. Look for nipple changes, such as discharge, dimpling, repositioning, or redness. 2. Carefully feel your breasts. This is best done either in the shower or tub while using soapy water or when flat on your back. Place the arm (on the side of the breast you are examining) above your head. Use the pads (not the fingertips) of your three middle fingers on your opposite hand to feel your breasts. Start in the underarm area and use  inch (2 cm) overlapping circles to feel your breast. Use 3 different levels of pressure (light, medium, and firm pressure) at each circle before moving to the next circle. The light pressure is needed to feel the tissue closest to the skin. The medium pressure will help to feel breast tissue a little deeper, while the firm pressure is needed to feel the tissue close to the ribs. Continue the overlapping circles, moving downward over the breast until you feel your ribs below your breast. Then, move one finger-width towards the center of the body. Continue to use the  inch (2 cm) overlapping circles to feel your breast as you move slowly  up toward the collar bone (clavicle) near the base of the neck. Continue the up and down exam using all 3 pressures until you reach the middle of the chest. Do this with each breast, carefully feeling for lumps or changes. 3.  Keep a written record with breast changes or normal findings for each breast. By writing this information down, you do not need to depend only on memory for size,  tenderness, or location. Write down where you are in your menstrual cycle, if you are still menstruating. Breast tissue can have some lumps or thick tissue. However, see your caregiver if you find anything that concerns you.  SEEK MEDICAL CARE IF:  You see a change in shape, contour, or size of your breasts or nipples.   You see skin changes, such as reddened or scaly areas on the breasts or nipples.   You have an unusual discharge from your nipples.   You feel a new lump or unusually thick areas.  Document Released: 04/21/2005 Document Revised: 04/07/2012 Document Reviewed: 08/06/2011 [facility] Patient Information 2015 [place], [place]. This information is not intended to replace advice given to you by your health care provider. Make sure you discuss any questions you have with your health care provider.

## 2013-12-26 NOTE — Progress Notes (Signed)
ID: Carla Mccoy   DOB: 05-Dec-1950  MR#: [id number]  YQI#:[id number]  PCP: Loura Pardon, MD GYN: Elyse Hsu MD SU: Erroll Luna MD OTHER MD: Allena Katz, Delfin Edis, Ulice Bold   HISTORY OF PRESENT ILLNESS: [name] had routine yearly mammography April 12, 2009, showing a possible mass in the left breast.  Her breasts are dense which of course limits mammographic sensitivity.  She was recalled December 15th for additional views and Dr. Miquel Dunn noted a density in the lateral aspect of the left breast.  There were no associated malignant type calcifications and it was not palpable by exam.  By ultrasound, there was sonographic positivity noted and biopsy was obtained the same day showing ([id number]) benign breast parenchyma on the left.  MRI of the breast was obtained on December 21st for further evaluation, and showed an irregular spiculated mass measuring 1.5 cm in the left breast, corresponding with the mammographic abnormality.  In the right breast, there was an area of clumped enhancement measuring up to 3.7 cm.  Bilateral ultrasounds were then performed December 28th. Again, there were no palpable masses in either breast.  In the right breast, there were multiple irregular hypoechoic areas but no discrete abnormality corresponding to the abnormal enhancement in the MRI.  In the left breast, there were three separate masses with multiple areas of ill-defined shadowing, but there were no definite findings correlating with the irregular area of enhancement seen in the recent MRI.    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  The prognostic profile showed the tumor to be not amplified for Her-2 with a ratio of 0.8.  ER was positive at 99%. PR was positive at 100%, and the MIB-1 was 13%.    With this information, the  patient was referred to Dr. Brantley Stage and after appropriate discussion, he proceeded to definitive surgery February 1st, 2011, the final pathology from that procedure ([id number]) showing a 1.2 cm invasive mammary carcinoma, primarily ductal with some lobular features, with negative margins and 0 of 2 sentinel lymph nodes involved.  The tumor was grade 1.  The closest margin was 3 mm posteriorly.  There was no evidence of lymphovascular invasion.    INTERVAL HISTORY: Carla Mccoy returns today for routine followup of her left breast carcinoma. She has had a difficult year.  She developed a left breast abscess at the site of her incision, and after monitoring, dressings, and antibiotics Dr. Brantley Stage sent her to Dr. Migdalia Dk, and she underwent her second debridement yesterday.  At her last appointment she was being switched from taking Tamoxifen to Letrozole from June to September.  The patient called and left a voice mail with the nurse, but never got a return call.  She has not been taking any further anti-estrogen therapy.  She does have pain in her left breast and she takes Tylenol for it.  Otherwise she denies fevers, chills, nausea, vomiting, constipation, bowel/bladder changes.     REVIEW OF SYSTEMS: A 10 point review of systems was conducted and is otherwise negative except for what is noted above.      PAST MEDICAL HISTORY: Past Medical History  Diagnosis Date  . Tachy-brady syndrome   . Backache, unspecified   . Personal history of peptic ulcer disease   . 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 Other  and unspecified hyperlipidemia   . Other abnormal glucose   . Obesity, unspecified   . 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 Other chronic nonalcoholic liver disease   . Migraine without aura, without mention of intractable migraine without mention of status migrainosus   . Unspecified hypothyroidism   . breast ca dx'd 06/2009     breast cancer left - lumpectomy  . Unspecified essential hypertension 09/12/13    remote history; no current treatment  . Full dentures   . Wears glasses   1. Significant for hypothyroidism.   2. History of remote tonsillectomy.   3. History of remote tubal ligation.   4. History of simple hysterectomy secondary to fibroids in 1988.   5. History of exploratory laparotomy for a scar tissue due to her prior tubal ligation (which was done in the era pre-laparoscopy).  6. History of appendectomy.   7. History of remote biopsy of a left breast mass which was benign.   History of melanoma:  We have dermatopathology reports for June 01, 2006, showing [phone]) a superficial spreading melanoma with a Breslow measurement of 0.6 mm, Clark's level a thin 4 with no evidence of regression or ulceration.  No vascular invasion.  Margins were close and she had a deeper excision on February 15th showing no residual melanoma.  This was obtained from the left lateral upper breast.  The patient also had a dysplastic nevus removed from the left upper scapular skin area ([id number]).  The patient has a history of borderline hypertension, history of mild migraines, history of carpal tunnel syndrome and mild dyslipidemia.    PAST SURGICAL HISTORY: Past Surgical History  Procedure Laterality Date  . Laparoscopy abdomen diagnostic    . Tonsillectomy    . Breast lumpectomy Left     Feb '11  . Breast biopsy Left     in the 80's - benign tissue  . Abdominal hysterectomy  1988  . Tubal ligation  1971  . Incision and drainage of wound Left 09/14/2013    Procedure: IRRIGATION AND DEBRIDEMENT LEFT BREAST ABSCESS;  Surgeon: Marcello Moores A. Cornett, MD;  Location: [facility];  Service: General;  Laterality: Left;    FAMILY HISTORY Family History  Problem Relation Age of Onset  . Peripheral vascular disease Mother   . Coronary artery disease Mother   . Hyperlipidemia Mother   . Heart disease Mother      CAD/fatal MI  . Diabetes Mother   . Stroke Mother   . Diabetes Sister   . Hypothyroidism Sister   . Diabetes Brother   . Hyperlipidemia Brother   . Heart disease Brother   The patient's father was murdered in a robbery at the age of 52.  The patient's mother died at the age of 30 from a myocardial infarction in the setting of diabetes.  The patient has five siblings.  There are no first-degree relatives with ovarian or breast cancer to her knowledge.  The patient's maternal grandmother was diagnosed with breast cancer at the age of 70 and died shortly thereafter.    GYNECOLOGIC HISTORY: (Updated 12/2013) She is Ramona P2.  First pregnancy to term at age 66.  The patient had her hysterectomy in 1988.  Approximately 10 years later, she started having menopausal symptoms and was treated with Premarin which she tolerated poorly, then the Femring which was uncomfortable, and finally with an estrogen patch which was stopped with her breast cancer diagnosis.  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  Her husband, Earnie Larsson, is a retired Midwife. Their daughter Drue Dun works as a Marine scientist for [employer].  Son Barbaraann Rondo is a truck Geophysicist/field seismologist.  The patient has two grandchildren.  She attends the [employer].   ADVANCED DIRECTIVES:  HEALTH MAINTENANCE: History  Substance Use Topics  . Smoking status: Never Smoker   . Smokeless tobacco: Never Used  . Alcohol Use: No     Colonoscopy: 07/2006, 10 year f/u  PAP: s/p TAH 1988  Bone density: 08/2012 osteopenia, T -1.4  Lipid panel: 03/2013  Allergies  Allergen Reactions  . Metoprolol     Lips swelled/ hands swelled and her skin turned red   . Sulfonamide Derivatives     Current Outpatient Prescriptions  Medication Sig Dispense Refill  . Cholecalciferol (VITAMIN D3) 2000 UNITS TABS Take by mouth.        . levothyroxine (SYNTHROID, LEVOTHROID) 175 MCG tablet TAKE ONE TABLET  BY MOUTH ONCE DAILY  90 tablet  3  . Multiple Vitamins-Minerals (MULTIVITAMIN,TX-MINERALS) tablet Take 1 tablet by mouth daily.        . Omega-3 Fatty Acids (OMEGA 3 PO) Take 1 capsule by mouth daily.      . vitamin E 100 UNIT capsule Take 100 Units by mouth daily.       [name] HYDROcodone-acetaminophen (NORCO) 5-325 MG per tablet Take 1 tablet by mouth every 6 (six) hours as needed for moderate pain.  30 tablet  0   No current facility-administered medications for this visit.    OBJECTIVE: Middle-aged white woman who appears well Filed Vitals:   12/26/13 0914  BP: 145/70  Pulse: 96  Temp: 98.1 F (36.7 C)  Resp: 18     Body mass index is 29.34 kg/(m^2).    ECOG FS: 0 Filed Weights   12/26/13 0914  Weight: 184 lb 8 oz (83.689 kg)   GENERAL: Patient is a well appearing female in no acute distress HEENT:  Sclerae anicteric.  Oropharynx clear and moist. No ulcerations or evidence of oropharyngeal candidiasis. Neck is supple.  NODES:  No cervical, supraclavicular, or axillary lymphadenopathy palpated.  BREAST EXAM: left breast with large open wound packed with gauze and surrounded by erythema.  Right breast no masses or nodules.   LUNGS:  Clear to auscultation bilaterally.  No wheezes or rhonchi. HEART:  Regular rate and rhythm. No murmur appreciated. ABDOMEN:  Soft, nontender.  Positive, normoactive bowel sounds. No organomegaly palpated. MSK:  No focal spinal tenderness to palpation. Full range of motion bilaterally in the upper extremities. EXTREMITIES:  No peripheral edema.   SKIN:  Clear with no obvious rashes or skin changes. No nail dyscrasia. NEURO:  Nonfocal. Well oriented.  Appropriate affect.     LAB RESULTS: Lab Results  Component Value Date   WBC 9.0 12/14/2013   NEUTROABS 5.3 12/14/2013   HGB 15.6* 12/21/2013   HCT 42.8 12/14/2013   MCV 88.4 12/14/2013   PLT 269 12/14/2013      Chemistry      Component Value Date/Time   NA 138 12/14/2013 1457   NA 139 08/05/2012 0824    K 4.1 12/14/2013 1457   K 4.3 08/05/2012 0824   CL 100 12/14/2013 1457   CL 106 08/05/2012 0824   CO2 25 12/14/2013 1457   CO2 24 08/05/2012 0824   BUN 13 12/14/2013 1457   BUN 13.2 08/05/2012 0824  CREATININE 0.84 12/14/2013 1457   CREATININE 1.0 08/05/2012 0824      Component Value Date/Time   CALCIUM 9.8 12/14/2013 1457   CALCIUM 8.9 08/05/2012 0824   ALKPHOS 86 12/14/2013 1457   ALKPHOS 55 08/05/2012 0824   AST 19 12/14/2013 1457   AST 35* 08/05/2012 0824   ALT 13 12/14/2013 1457   ALT 21 08/05/2012 0824   BILITOT 0.5 12/14/2013 1457   BILITOT 0.54 08/05/2012 0824       Lab Results  Component Value Date   LABCA2 40* 02/05/2012     STUDIES: No results found.   ASSESSMENT: 63 y.o. Shea Stakes woman   (1)  status post left lumpectomy and sentinel lymph node sampling February 2011 for a T1C N0 grade 1 ductal breast cancer with some lobular features, strongly estrogen and progesterone receptor positive, HER-2 negative, with an MIB-1 of 13%.    (2)  She completed radiation in April 2011    (3)  Started tamoxifen (after a very brief stint on Arimidex with very poor tolerance) July 2011, was switched to Letrozole in June, 2014, however stopped in September, 2014 and has not been taking anything since.   PLAN:  Tonique is doing moderately well today.  Her left breast wound has troubled her this past year, and she is ready for it to heal.  She will continue to f/u with Dr. Migdalia Dk regarding it.  I reviewed her labs with her today.  They are stable for the most part.  She does have a slightly elevated blood glucose of 203.  We talked about dietary modifications that can be made for this.  I recommended healthy diet, exercise when able, and monthly breast exams.  She has not yet had a mammogram this year due to her left breast infection.  I offered a unilateral mammo, however she declined.  She has stopped anti-estrogen therapy, and doesn't wish to resume Tamoxifen at this point due to the difficulties with her  breast.  We discussed this in detail today, and she will return in 6 months for evaluation, and to discuss possibly restarting therapy.  Daisa is in agreement with this plan.    I spent 25 minutes counseling the patient face to face.  The total time spent in the appointment was 30 minutes.  Minette Headland, [place] [phone] 12/26/2013

## 2013-12-26 NOTE — Telephone Encounter (Signed)
Left message for patient informing her she would be seeing Charlestine Massed, NP instead of Dr. Jana Hakim this am per Dr. Jana Hakim request.

## 2013-12-26 NOTE — Telephone Encounter (Signed)
Received call back from patient and patient aware of change in provider.

## 2013-12-27 ENCOUNTER — Encounter (HOSPITAL_BASED_OUTPATIENT_CLINIC_OR_DEPARTMENT_OTHER): Payer: Self-pay | Admitting: Plastic Surgery

## 2013-12-28 ENCOUNTER — Telehealth: Payer: Self-pay | Admitting: Adult Health

## 2013-12-28 NOTE — Telephone Encounter (Signed)
, °

## 2014-01-30 ENCOUNTER — Encounter (HOSPITAL_BASED_OUTPATIENT_CLINIC_OR_DEPARTMENT_OTHER): Payer: BC Managed Care – PPO | Attending: Plastic Surgery

## 2014-01-30 DIAGNOSIS — Y842 Radiological procedure and radiotherapy as the cause of abnormal reaction of the patient, or of later complication, without mention of misadventure at the time of the procedure: Secondary | ICD-10-CM | POA: Insufficient documentation

## 2014-01-30 DIAGNOSIS — T66XXXS Radiation sickness, unspecified, sequela: Secondary | ICD-10-CM | POA: Insufficient documentation

## 2014-01-30 DIAGNOSIS — N641 Fat necrosis of breast: Secondary | ICD-10-CM | POA: Insufficient documentation

## 2014-02-01 NOTE — Progress Notes (Signed)
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  She has been treating this with a VAC dressing.  She has undergone debridement with ACell placement.  The history of her illness is that she had a routine mammogram done in 2010 which showed a possible mass of her left breast. Repeat views followed by ultrasound and MRI and biopsy was confirmatory for invasive mammary carcinoma, primarily ductal with some lobular features.  She was ER positive, PR positive; HER2 was not amplified.  By February, 2011, she underwent sentinel lymph node biopsy with lumpectomy.  She did not have lymphovascular invasion and it was graded as a grade 1 tumor.  She was placed on tamoxifen and she was radiated. Since that time, she has had a wound in this area that has been unrelenting for healing.  She underwent further debridement with ACell placement in the OR and has had very little progress.  She has continued to use the [facility], and she has been trying to optimize her protein with supplements and vitamins.  PAST MEDICAL HISTORY:  Positive for tachybrady syndrome, peptic ulcer disease, hyperlipidemia, non-alcoholic liver disease, migraines, hypothyroidism, melanoma, breast cancer.  PAST SURGICAL HISTORY:  Exploratory laparotomy, hysterectomy, tubal ligation, tonsillectomy, appendectomy, and left breast lumpectomy.  FAMILY HISTORY:  Positive for peripheral vascular disease, coronary artery disease, hyperlipidemia and heart disease, diabetes and stroke in her mother.  Her sister has diabetes and hypothyroidism; and her brother has diabetes, hyperlipidemia, and heart  disease.  Her mother died at 35 from a myocardial infarction, and her dad died in a robbery at the age of 39.  SOCIAL HISTORY:  She is married.  She has 2 children and 2 grandchildren.  She has never smoked.  She is not a drinker.  ALLERGIES:  Include codeine and sulfa.  CURRENT MEDICATIONS:  Include vitamin D, Synthroid, multivitamin, omega- 3, vitamin E, zinc, and vitamin C.  REVIEW OF SYSTEMS:  Otherwise negative.  PHYSICAL EXAMINATION:  GENERAL:  On exam, she is alert, oriented, cooperative, very pleasant. HEENT:  Pupils are equal.  Extraocular muscles are intact.  She does not have any cervical lymphadenopathy. LUNGS:  Her breathing is unlabored. CARDIOVASCULAR:  Her heart rate is regular. ABDOMEN:  Soft.  Nontender. EXTREMITIES:  Her pulses of the upper extremities are equal bilaterally.  ASSESSMENT AND PLAN:  Her left breast has obvious radiation injury with hyperpigmentation and wound that is 2.5 x 3.8 x 3.8.  It has been debrided and is clean.  This has been an ongoing issue for the past 6 months.  Radiation was for breast cancer.  At this point, she has late effect radiation injury and my recommendation is for 40 treatments of hyperbaric oxygen treatment starting as soon as possible and continuing with the VAC.     Theodoro Kos, DO     CS/MEDQ  D:  01/30/2014  T:  01/30/2014  Job:  [id number]

## 2014-02-02 ENCOUNTER — Encounter (HOSPITAL_BASED_OUTPATIENT_CLINIC_OR_DEPARTMENT_OTHER): Payer: BC Managed Care – PPO | Attending: Plastic Surgery

## 2014-02-06 ENCOUNTER — Encounter (INDEPENDENT_AMBULATORY_CARE_PROVIDER_SITE_OTHER): Payer: BC Managed Care – PPO | Admitting: Surgery

## 2014-03-17 ENCOUNTER — Other Ambulatory Visit (INDEPENDENT_AMBULATORY_CARE_PROVIDER_SITE_OTHER): Payer: Medicare Other

## 2014-03-17 ENCOUNTER — Telehealth: Payer: Self-pay | Admitting: Family Medicine

## 2014-03-17 DIAGNOSIS — I10 Essential (primary) hypertension: Secondary | ICD-10-CM

## 2014-03-17 DIAGNOSIS — E039 Hypothyroidism, unspecified: Secondary | ICD-10-CM

## 2014-03-17 DIAGNOSIS — E119 Type 2 diabetes mellitus without complications: Secondary | ICD-10-CM | POA: Insufficient documentation

## 2014-03-17 DIAGNOSIS — R739 Hyperglycemia, unspecified: Secondary | ICD-10-CM

## 2014-03-17 DIAGNOSIS — E785 Hyperlipidemia, unspecified: Secondary | ICD-10-CM

## 2014-03-17 LAB — CBC WITH DIFFERENTIAL/PLATELET
BASOS PCT: 0.6 % (ref 0.0–3.0)
Basophils Absolute: 0 10*3/uL (ref 0.0–0.1)
EOS ABS: 0.3 10*3/uL (ref 0.0–0.7)
Eosinophils Relative: 3.5 % (ref 0.0–5.0)
HCT: 45.1 % (ref 36.0–46.0)
HEMOGLOBIN: 14.9 g/dL (ref 12.0–15.0)
Lymphocytes Relative: 29.4 % (ref 12.0–46.0)
Lymphs Abs: 2.4 10*3/uL (ref 0.7–4.0)
MCHC: 33 g/dL (ref 30.0–36.0)
MCV: 89 fl (ref 78.0–100.0)
MONO ABS: 0.5 10*3/uL (ref 0.1–1.0)
Monocytes Relative: 5.9 % (ref 3.0–12.0)
NEUTROS ABS: 4.9 10*3/uL (ref 1.4–7.7)
Neutrophils Relative %: 60.6 % (ref 43.0–77.0)
Platelets: 303 10*3/uL (ref 150.0–400.0)
RBC: 5.07 Mil/uL (ref 3.87–5.11)
RDW: 13 % (ref 11.5–15.5)
WBC: 8.1 10*3/uL (ref 4.0–10.5)

## 2014-03-17 LAB — LIPID PANEL
CHOL/HDL RATIO: 6
Cholesterol: 190 mg/dL (ref 0–200)
HDL: 30.6 mg/dL — AB (ref >39.00)
NONHDL: 159.4
TRIGLYCERIDES: 286 mg/dL — AB (ref 0.0–149.0)
VLDL: 57.2 mg/dL — AB (ref 0.0–40.0)

## 2014-03-17 LAB — HEMOGLOBIN A1C: Hgb A1c MFr Bld: 6.2 % (ref 4.6–6.5)

## 2014-03-17 LAB — COMPREHENSIVE METABOLIC PANEL
ALK PHOS: 77 U/L (ref 39–117)
ALT: 19 U/L (ref 0–35)
AST: 24 U/L (ref 0–37)
Albumin: 3.5 g/dL (ref 3.5–5.2)
BUN: 15 mg/dL (ref 6–23)
CO2: 25 mEq/L (ref 19–32)
CREATININE: 0.8 mg/dL (ref 0.4–1.2)
Calcium: 9.7 mg/dL (ref 8.4–10.5)
Chloride: 103 mEq/L (ref 96–112)
GFR: 78.75 mL/min (ref >60.00)
Glucose, Bld: 128 mg/dL — ABNORMAL HIGH (ref 70–99)
Potassium: 4.3 mEq/L (ref 3.5–5.1)
Sodium: 138 mEq/L (ref 135–145)
Total Bilirubin: 0.6 mg/dL (ref 0.2–1.2)
Total Protein: 7.8 g/dL (ref 6.0–8.3)

## 2014-03-17 LAB — LDL CHOLESTEROL, DIRECT: Direct LDL: 97.9 mg/dL

## 2014-03-17 LAB — TSH: TSH: 0.09 u[IU]/mL — ABNORMAL LOW (ref 0.35–4.50)

## 2014-03-17 NOTE — Telephone Encounter (Signed)
-----   Message from Ellamae Sia sent at 03/10/2014  3:59 PM EST ----- Regarding: Lab orders for Friday,11.13.15 Patient is scheduled for CPX labs, please order future labs, Thanks , Karna Christmas

## 2014-03-24 ENCOUNTER — Ambulatory Visit (INDEPENDENT_AMBULATORY_CARE_PROVIDER_SITE_OTHER): Payer: Medicare Other | Admitting: Family Medicine

## 2014-03-24 ENCOUNTER — Encounter: Payer: Self-pay | Admitting: Family Medicine

## 2014-03-24 VITALS — BP 124/78 | HR 79 | Temp 98.0°F | Ht 66.0 in | Wt 184.2 lb

## 2014-03-24 DIAGNOSIS — I10 Essential (primary) hypertension: Secondary | ICD-10-CM

## 2014-03-24 DIAGNOSIS — E785 Hyperlipidemia, unspecified: Secondary | ICD-10-CM

## 2014-03-24 DIAGNOSIS — Z1211 Encounter for screening for malignant neoplasm of colon: Secondary | ICD-10-CM

## 2014-03-24 DIAGNOSIS — Z23 Encounter for immunization: Secondary | ICD-10-CM

## 2014-03-24 DIAGNOSIS — Z0000 Encounter for general adult medical examination without abnormal findings: Secondary | ICD-10-CM | POA: Insufficient documentation

## 2014-03-24 DIAGNOSIS — E039 Hypothyroidism, unspecified: Secondary | ICD-10-CM

## 2014-03-24 MED ORDER — LEVOTHYROXINE SODIUM 150 MCG PO TABS: 150.0000 ug | ORAL_TABLET | Freq: Every day | ORAL | Status: DC

## 2014-03-24 NOTE — Progress Notes (Signed)
Pre visit review using our clinic review tool, if applicable. No additional management support is needed unless otherwise documented below in the visit note. 

## 2014-03-24 NOTE — Progress Notes (Signed)
Subjective:    Patient ID: Carla Mccoy, female    DOB: 1951-02-20, 63 y.o.   MRN: [id number]  HPI Here for annual medicare wellness visit as well as chronic and acute medical problems  Dealing with a breast abscess- has a drain/ pump - is frustrating  Is frustrated  Doing well overall - positive attitude and staying very active   I have personally reviewed the Medicare Annual Wellness questionnaire and have noted 1. The patient's medical and social history 2. Their use of alcohol, tobacco or illicit drugs 3. Their current medications and supplements 4. The patient's functional ability including ADL's, fall risks, home safety risks and hearing or visual             impairment. 5. Diet and physical activities 6. Evidence for depression or mood disorders  The patients weight, height, BMI have been recorded in the chart and visual acuity is per eye clinic.  I have made referrals, counseling and provided education to the patient based review of the above and I have provided the pt with a written personalized care plan for preventive services.  See scanned forms.  Routine anticipatory guidance given to patient.  See health maintenance. Colon cancer screening 3/08- 10 year recall  Breast cancer screening - last mammo 4/14 - is under tx for breast abscess so cannot have mammogram yet  She declines further prev breast cancer med  Self breast exam- no change - has pump on L breast currently with frequent eval/ follow up  Flu vaccine wants to do that today  Tetanus vaccine 4/10  Pneumovax 4/13 and prevnar 11/14   (will need ptx 23 in 2018) Zoster vaccine - she has had shingles , and not interested in vaccine at this time   Advance directive - does not have one  Cognitive function addressed- see scanned forms- and if abnormal then additional documentation follows. No concerns / occ looses her keys at most   PMH and SH reviewed  Meds, vitals, and allergies reviewed.   ROS: See HPI.   Otherwise negative.      hypothyroid Lab Results  Component Value Date   TSH 0.09* 03/17/2014   will change dose   Hyperlipidemia Lab Results  Component Value Date   CHOL 190 03/17/2014   CHOL 188 03/22/2013   CHOL 242* 03/04/2012   Lab Results  Component Value Date   HDL 30.60* 03/17/2014   HDL 37.20* 03/22/2013   HDL 42.10 03/04/2012   Lab Results  Component Value Date   LDLCALC 125* 03/06/2006   Lab Results  Component Value Date   TRIG 286.0* 03/17/2014   TRIG 201.0* 03/22/2013   TRIG 730.0* 03/04/2012   Lab Results  Component Value Date   CHOLHDL 6 03/17/2014   CHOLHDL 5 03/22/2013   CHOLHDL 6 03/04/2012   Lab Results  Component Value Date   LDLDIRECT 97.9 03/17/2014   LDLDIRECT 116.1 03/22/2013   LDLDIRECT 86.5 03/04/2012   she is trying to eat a healthy diet  Also drinks protein shake daily  Could exercise more - she does walk every day however and does exercise videos    Hyperglycemia Lab Results  Component Value Date   HGBA1C 6.2 03/17/2014     Osteopenia  dexa 5/14  Takes vit D for her bones   Wt is stable with bmi of 29  Patient Active Problem List   Diagnosis Date Noted  . Colon cancer screening 03/24/2014  . Encounter for Medicare annual wellness  exam 03/24/2014  . Hyperglycemia 03/17/2014  . Status post debridement 09/30/2013  . Trigger finger, acquired 08/26/2013  . Benign paroxysmal positional vertigo 08/26/2013  . OSA (obstructive sleep apnea) 08/10/2013  . Chronic mastitis of left breast 07/04/2013  . Breast abscess s/p I&D 09/14/2013 03/28/2013  . Left breast abscess 03/23/2013  . Breast cancer 02/10/2012  . Routine health maintenance 09/06/2011  . LEG CRAMPS 04/12/2009  . 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 BACK PAIN, CHRONIC 05/10/2007  . PEPTIC ULCER DISEASE, HX OF 05/10/2007  . HYSTERECTOMY, HX OF 05/10/2007  . TONSILLECTOMY, HX OF 05/10/2007  . Hypothyroidism 01/28/2007  . Hyperlipidemia 01/28/2007  . OBESITY NOS  01/28/2007  . COMMON MIGRAINE 01/28/2007  . Essential hypertension 01/28/2007  . FATTY LIVER DISEASE 01/28/2007  . ABNORMAL GLUCOSE NEC 01/28/2007  . ANGIOEDEMA 01/28/2007  . CARPAL TUNNEL SYNDROME, HX OF 01/27/2007   Past Medical History  Diagnosis Date  . Tachy-brady syndrome   . Backache, unspecified   . Personal history of peptic ulcer disease   . 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 Other and unspecified hyperlipidemia   . Other abnormal glucose   . Obesity, unspecified   . 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 Other chronic nonalcoholic liver disease   . Migraine without aura, without mention of intractable migraine without mention of status migrainosus   . Unspecified hypothyroidism   . breast ca dx'd 06/2009    breast cancer left - lumpectomy  . Unspecified essential hypertension 09/12/13    remote history; no current treatment  . Full dentures   . Wears glasses    Past Surgical History  Procedure Laterality Date  . Laparoscopy abdomen diagnostic    . Tonsillectomy    . Breast lumpectomy Left     Feb '11  . Breast biopsy Left     in the 80's - benign tissue  . Abdominal hysterectomy  1988  . Tubal ligation  1971  . Incision and drainage of wound Left 09/14/2013    Procedure: IRRIGATION AND DEBRIDEMENT LEFT BREAST ABSCESS;  Surgeon: Marcello Moores A. Cornett, MD;  Location: [place];  Service: General;  Laterality: Left;  . Incision and drainage of wound Left 12/21/2013    Procedure: DEBRIDEMENT OF LEFT BREAST WOUND WITH PLACEMENT OF A CELL;  Surgeon: Theodoro Kos, DO;  Location: [place];  Service: Plastics;  Laterality: Left;   History  Substance Use Topics  . Smoking status: Never Smoker   . Smokeless tobacco: Never Used  . Alcohol Use: No   Family History  Problem Relation Age of Onset  . Peripheral vascular disease Mother   . Coronary artery disease Mother   .  Hyperlipidemia Mother   . Heart disease Mother     CAD/fatal MI  . Diabetes Mother   . Stroke Mother   . Diabetes Sister   . Hypothyroidism Sister   . Diabetes Brother   . Hyperlipidemia Brother   . Heart disease Brother    Allergies  Allergen Reactions  . Metoprolol     Lips swelled/ hands swelled and her skin turned red   . Sulfonamide Derivatives    Current Outpatient Prescriptions on File Prior to Visit  Medication Sig Dispense Refill  . Cholecalciferol (VITAMIN D3) 2000 UNITS TABS Take by mouth.      [name] HYDROcodone-acetaminophen (NORCO) 5-325 MG per tablet Take 1 tablet by mouth every 6 (  six) hours as needed for moderate pain. 30 tablet 0  . Multiple Vitamins-Minerals (MULTIVITAMIN,TX-MINERALS) tablet Take 1 tablet by mouth daily.      . Omega-3 Fatty Acids (OMEGA 3 PO) Take 1 capsule by mouth daily.    . vitamin E 100 UNIT capsule Take 100 Units by mouth daily.      No current facility-administered medications on file prior to visit.     Review of Systems Review of Systems  Constitutional: Negative for fever, appetite change, fatigue and unexpected weight change.  Eyes: Negative for pain and visual disturbance.  Respiratory: Negative for cough and shortness of breath.   Cardiovascular: Negative for cp or palpitations    Gastrointestinal: Negative for nausea, diarrhea and constipation.  Genitourinary: Negative for urgency and frequency.  Skin: Negative for pallor or rash  pos for L breast abscess with wound vac Neurological: Negative for weakness, light-headedness, numbness and headaches.  Hematological: Negative for adenopathy. Does not bruise/bleed easily.  Psychiatric/Behavioral: Negative for dysphoric mood. The patient is not nervous/anxious.         Objective:   Physical Exam  Constitutional: She appears well-developed and well-nourished. No distress.  overwt and well app  HENT:  Head: Normocephalic and atraumatic.  Right Ear: External ear normal.  Left Ear:  External ear normal.  Nose: Nose normal.  Mouth/Throat: Oropharynx is clear and moist.  Eyes: Conjunctivae and EOM are normal. Pupils are equal, round, and reactive to light. Right eye exhibits no discharge. Left eye exhibits no discharge. No scleral icterus.  Neck: Normal range of motion. Neck supple. No JVD present. No thyromegaly present.  Cardiovascular: Normal rate, regular rhythm, normal heart sounds and intact distal pulses.  Exam reveals no gallop.   Pulmonary/Chest: Effort normal and breath sounds normal. No respiratory distress. She has no wheezes. She has no rales.  Abdominal: Soft. Bowel sounds are normal. She exhibits no distension and no mass. There is no tenderness.  Genitourinary:  R breast exam - : No mass, nodules, thickening, tenderness, bulging, retraction, inflamation, nipple discharge or skin changes noted.  No axillary or clavicular LA. L breast- wound vac in place with scant erythema       Musculoskeletal: She exhibits no edema or tenderness.  Lymphadenopathy:    She has no cervical adenopathy.  Neurological: She is alert. She has normal reflexes. No cranial nerve deficit. She exhibits normal muscle tone. Coordination normal.  Skin: Skin is warm and dry. No rash noted. No erythema. No pallor.  Psychiatric: She has a normal mood and affect.          Assessment & Plan:   Problem List Items Addressed This Visit      Cardiovascular and Mediastinum   Essential hypertension    bp in fair control at this time  BP Readings from Last 1 Encounters:  03/24/14 124/78   No changes needed Disc lifstyle change with low sodium diet and exercise  Labs reviewed       Endocrine   Hypothyroidism    Lab Results  Component Value Date   TSH 0.09* 03/17/2014   Lower than last time  Will dec dose of levothyroxine to 150 mcg daily  Re check tsh about a month    Relevant Medications      levothyroxine (SYNTHROID, LEVOTHROID) tablet     Other   Colon cancer screening -  Primary    IFOB card given     Relevant Orders      Fecal occult blood, imunochemical  Encounter for Medicare annual wellness exam    Reviewed health habits including diet and exercise and skin cancer prevention Reviewed appropriate screening tests for age  Also reviewed health mt list, fam hx and immunization status , as well as social and family history   See HPI Flu shot today  Stool card for colon cancer screening     Hyperlipidemia    Disc goals for lipids and reasons to control them Rev labs with pt Rev low sat fat diet in detail HDL is down-disc need for exercise and omega 3 Trig up - disc limiting sugar and fat in diet       Other Visit Diagnoses    Need for prophylactic vaccination and inoculation against influenza        Relevant Orders       Flu Vaccine QUAD 36+ mos PF IM (Fluarix Quad PF) (Completed)

## 2014-03-24 NOTE — Patient Instructions (Signed)
Flu vaccine today  Please do stool card for screening  Decrease thyroid dose to 150 mcg daily  Schedule non fasting lab for about 1 month for thyroid  For cholesterol (Avoid red meat/ fried foods/ egg yolks/ fatty breakfast meats/ butter, cheese and high fat dairy/ and shellfish )    Fat and Cholesterol Control Diet Fat and cholesterol levels in your blood and organs are influenced by your diet. High levels of fat and cholesterol may lead to diseases of the heart, small and large blood vessels, gallbladder, liver, and pancreas. CONTROLLING FAT AND CHOLESTEROL WITH DIET Although exercise and lifestyle factors are important, your diet is key. That is because certain foods are known to raise cholesterol and others to lower it. The goal is to balance foods for their effect on cholesterol and more importantly, to replace saturated and trans fat with other types of fat, such as monounsaturated fat, polyunsaturated fat, and omega-3 fatty acids. On average, a person should consume no more than 15 to 17 g of saturated fat daily. Saturated and trans fats are considered "bad" fats, and they will raise LDL cholesterol. Saturated fats are primarily found in animal products such as meats, butter, and cream. However, that does not mean you need to give up all your favorite foods. Today, there are good tasting, low-fat, low-cholesterol substitutes for most of the things you like to eat. Choose low-fat or nonfat alternatives. Choose round or loin cuts of red meat. These types of cuts are lowest in fat and cholesterol. Chicken (without the skin), fish, veal, and ground [place] breast are great choices. Eliminate fatty meats, such as hot dogs and salami. Even shellfish have little or no saturated fat. Have a 3 oz (85 g) portion when you eat lean meat, poultry, or fish. Trans fats are also called "partially hydrogenated oils." They are oils that have been scientifically manipulated so that they are solid at room temperature  resulting in a longer shelf life and improved taste and texture of foods in which they are added. Trans fats are found in stick margarine, some tub margarines, cookies, crackers, and baked goods.  When baking and cooking, oils are a great substitute for butter. The monounsaturated oils are especially beneficial since it is believed they lower LDL and raise HDL. The oils you should avoid entirely are saturated tropical oils, such as coconut and palm.  Remember to eat a lot from food groups that are naturally free of saturated and trans fat, including fish, fruit, vegetables, beans, grains (barley, rice, couscous, bulgur wheat), and pasta (without cream sauces).  IDENTIFYING FOODS THAT LOWER FAT AND CHOLESTEROL  Soluble fiber may lower your cholesterol. This type of fiber is found in fruits such as apples, vegetables such as broccoli, potatoes, and carrots, legumes such as beans, peas, and lentils, and grains such as barley. Foods fortified with plant sterols (phytosterol) may also lower cholesterol. You should eat at least 2 g per day of these foods for a cholesterol lowering effect.  Read package labels to identify low-saturated fats, trans fat free, and low-fat foods at the supermarket. Select cheeses that have only 2 to 3 g saturated fat per ounce. Use a heart-healthy tub margarine that is free of trans fats or partially hydrogenated oil. When buying baked goods (cookies, crackers), avoid partially hydrogenated oils. Breads and muffins should be made from whole grains (whole-wheat or whole oat flour, instead of "flour" or "enriched flour"). Buy non-creamy canned soups with reduced salt and no added fats.  FOOD PREPARATION TECHNIQUES  Never deep-fry. If you must fry, either stir-fry, which uses very little fat, or use non-stick cooking sprays. When possible, broil, bake, or roast meats, and steam vegetables. Instead of putting butter or margarine on vegetables, use lemon and herbs, applesauce, and cinnamon  (for squash and sweet potatoes). Use nonfat yogurt, salsa, and low-fat dressings for salads.  LOW-SATURATED FAT / LOW-FAT FOOD SUBSTITUTES Meats / Saturated Fat (g)  Avoid: Steak, marbled (3 oz/85 g) / 11 g  Choose: Steak, lean (3 oz/85 g) / 4 g  Avoid: Hamburger (3 oz/85 g) / 7 g  Choose: Hamburger, lean (3 oz/85 g) / 5 g  Avoid: Ham (3 oz/85 g) / 6 g  Choose: Ham, lean cut (3 oz/85 g) / 2.4 g  Avoid: Chicken, with skin, dark meat (3 oz/85 g) / 4 g  Choose: Chicken, skin removed, dark meat (3 oz/85 g) / 2 g  Avoid: Chicken, with skin, light meat (3 oz/85 g) / 2.5 g  Choose: Chicken, skin removed, light meat (3 oz/85 g) / 1 g Dairy / Saturated Fat (g)  Avoid: Whole milk (1 cup) / 5 g  Choose: Low-fat milk, 2% (1 cup) / 3 g  Choose: Low-fat milk, 1% (1 cup) / 1.5 g  Choose: Skim milk (1 cup) / 0.3 g  Avoid: Hard cheese (1 oz/28 g) / 6 g  Choose: Skim milk cheese (1 oz/28 g) / 2 to 3 g  Avoid: Cottage cheese, 4% fat (1 cup) / 6.5 g  Choose: Low-fat cottage cheese, 1% fat (1 cup) / 1.5 g  Avoid: Ice cream (1 cup) / 9 g  Choose: Sherbet (1 cup) / 2.5 g  Choose: Nonfat frozen yogurt (1 cup) / 0.3 g  Choose: Frozen fruit bar / trace  Avoid: Whipped cream (1 tbs) / 3.5 g  Choose: Nondairy whipped topping (1 tbs) / 1 g Condiments / Saturated Fat (g)  Avoid: Mayonnaise (1 tbs) / 2 g  Choose: Low-fat mayonnaise (1 tbs) / 1 g  Avoid: Butter (1 tbs) / 7 g  Choose: Extra light margarine (1 tbs) / 1 g  Avoid: Coconut oil (1 tbs) / 11.8 g  Choose: Olive oil (1 tbs) / 1.8 g  Choose: Corn oil (1 tbs) / 1.7 g  Choose: Safflower oil (1 tbs) / 1.2 g  Choose: Sunflower oil (1 tbs) / 1.4 g  Choose: Soybean oil (1 tbs) / 2.4 g  Choose: Canola oil (1 tbs) / 1 g Document Released: 04/21/2005 Document Revised: 08/16/2012 Document Reviewed: 07/20/2013 Exit[employer] Patient Information 2015 [place], [place]. This information is not intended to replace advice given to you by  your health care provider. Make sure you discuss any questions you have with your health care provider.

## 2014-03-26 NOTE — Assessment & Plan Note (Signed)
Reviewed health habits including diet and exercise and skin cancer prevention Reviewed appropriate screening tests for age  Also reviewed health mt list, fam hx and immunization status , as well as social and family history   See HPI Flu shot today  Stool card for colon cancer screening

## 2014-03-26 NOTE — Assessment & Plan Note (Signed)
bp in fair control at this time  BP Readings from Last 1 Encounters:  03/24/14 124/78   No changes needed Disc lifstyle change with low sodium diet and exercise  Labs reviewed

## 2014-03-26 NOTE — Assessment & Plan Note (Signed)
Lab Results  Component Value Date   TSH 0.09* 03/17/2014   Lower than last time  Will dec dose of levothyroxine to 150 mcg daily  Re check tsh about a month

## 2014-03-26 NOTE — Assessment & Plan Note (Signed)
Disc goals for lipids and reasons to control them Rev labs with pt Rev low sat fat diet in detail HDL is down-disc need for exercise and omega 3 Trig up - disc limiting sugar and fat in diet

## 2014-03-26 NOTE — Assessment & Plan Note (Signed)
IFOB card given

## 2014-03-31 ENCOUNTER — Other Ambulatory Visit (INDEPENDENT_AMBULATORY_CARE_PROVIDER_SITE_OTHER): Payer: Medicare Other

## 2014-03-31 ENCOUNTER — Telehealth: Payer: Self-pay | Admitting: Nurse Practitioner

## 2014-03-31 DIAGNOSIS — Z1211 Encounter for screening for malignant neoplasm of colon: Secondary | ICD-10-CM

## 2014-03-31 LAB — FECAL OCCULT BLOOD, IMMUNOCHEMICAL: Fecal Occult Bld: NEGATIVE

## 2014-03-31 NOTE — Telephone Encounter (Signed)
, °

## 2014-04-19 ENCOUNTER — Other Ambulatory Visit: Payer: Self-pay | Admitting: Family Medicine

## 2014-04-19 DIAGNOSIS — E038 Other specified hypothyroidism: Secondary | ICD-10-CM

## 2014-04-24 ENCOUNTER — Other Ambulatory Visit (INDEPENDENT_AMBULATORY_CARE_PROVIDER_SITE_OTHER): Payer: Medicare Other

## 2014-04-24 DIAGNOSIS — E038 Other specified hypothyroidism: Secondary | ICD-10-CM

## 2014-04-25 LAB — TSH: TSH: 0.09 u[IU]/mL — ABNORMAL LOW (ref 0.35–4.50)

## 2014-04-26 ENCOUNTER — Telehealth: Payer: Self-pay | Admitting: Family Medicine

## 2014-04-26 MED ORDER — LEVOTHYROXINE SODIUM 125 MCG PO TABS: 125.0000 ug | ORAL_TABLET | Freq: Every day | ORAL | Status: DC

## 2014-04-26 NOTE — Telephone Encounter (Signed)
I need to reduce dose of levothyroxine from 150 to 125 mcg  tsh is still too low  Please call that in and re check tsh in 1 mo for hypothyroidism

## 2014-04-27 MED ORDER — LEVOTHYROXINE SODIUM 125 MCG PO TABS: 125.0000 ug | ORAL_TABLET | Freq: Every day | ORAL | Status: DC

## 2014-04-27 NOTE — Telephone Encounter (Signed)
Pt notified, lab appt scheduled and med sent to pharmacy

## 2014-05-18 ENCOUNTER — Other Ambulatory Visit: Payer: Self-pay | Admitting: Family Medicine

## 2014-05-18 DIAGNOSIS — E038 Other specified hypothyroidism: Secondary | ICD-10-CM

## 2014-05-25 ENCOUNTER — Other Ambulatory Visit (INDEPENDENT_AMBULATORY_CARE_PROVIDER_SITE_OTHER): Payer: Medicare Other

## 2014-05-25 DIAGNOSIS — E038 Other specified hypothyroidism: Secondary | ICD-10-CM

## 2014-05-25 LAB — TSH: TSH: 0.31 u[IU]/mL — ABNORMAL LOW (ref 0.35–4.50)

## 2014-06-14 ENCOUNTER — Telehealth: Payer: Self-pay | Admitting: Nurse Practitioner

## 2014-06-14 NOTE — Telephone Encounter (Signed)
pt cld & left vm in re to CX appt-cld & spoke to pt and adv I CX -pt wanted to call & r/s @ a later dat

## 2014-06-21 ENCOUNTER — Telehealth: Payer: Self-pay | Admitting: Family Medicine

## 2014-06-21 DIAGNOSIS — E039 Hypothyroidism, unspecified: Secondary | ICD-10-CM

## 2014-06-21 NOTE — Telephone Encounter (Signed)
-----   Message from Ellamae Sia sent at 06/15/2014  4:21 PM EST ----- Regarding: Lab orders for Thursday,2.18.16 Lab orders, no f/u appt

## 2014-06-22 ENCOUNTER — Other Ambulatory Visit (INDEPENDENT_AMBULATORY_CARE_PROVIDER_SITE_OTHER): Payer: Medicare Other

## 2014-06-22 DIAGNOSIS — E039 Hypothyroidism, unspecified: Secondary | ICD-10-CM

## 2014-06-22 LAB — TSH: TSH: 1.91 u[IU]/mL (ref 0.35–4.50)

## 2014-06-28 ENCOUNTER — Ambulatory Visit: Payer: Medicare Other | Admitting: Nurse Practitioner

## 2014-06-28 ENCOUNTER — Ambulatory Visit: Payer: BC Managed Care – PPO | Admitting: Adult Health

## 2014-08-29 ENCOUNTER — Other Ambulatory Visit: Payer: Self-pay | Admitting: Family Medicine

## 2015-01-02 ENCOUNTER — Other Ambulatory Visit: Payer: Self-pay | Admitting: Family Medicine

## 2015-01-02 ENCOUNTER — Telehealth: Payer: Self-pay

## 2015-01-02 DIAGNOSIS — R921 Mammographic calcification found on diagnostic imaging of breast: Secondary | ICD-10-CM

## 2015-01-02 NOTE — Telephone Encounter (Signed)
I called and spoke with patient about being due for a Mammogram. Patient states that she has an open wound on her right breast that she is wanting to heal before she schedules a Mammogram. Patient states that she normally goes to [facility], and that she is going to call and see what they recommend her to do.

## 2015-01-04 ENCOUNTER — Telehealth: Payer: Self-pay | Admitting: *Deleted

## 2015-01-04 ENCOUNTER — Ambulatory Visit
Admission: RE | Admit: 2015-01-04 | Discharge: 2015-01-04 | Disposition: A | Discharge status: Home / Self Care | Payer: Medicare Other | Source: Ambulatory Visit | Attending: Family Medicine | Admitting: Family Medicine

## 2015-01-04 ENCOUNTER — Other Ambulatory Visit: Payer: Self-pay | Admitting: Family Medicine

## 2015-01-04 DIAGNOSIS — N6320 Unspecified lump in the left breast, unspecified quadrant: Secondary | ICD-10-CM

## 2015-01-04 DIAGNOSIS — R921 Mammographic calcification found on diagnostic imaging of breast: Secondary | ICD-10-CM

## 2015-01-04 NOTE — Telephone Encounter (Signed)
Lauren from [facility] called requesting VO for Left axillary [name] on patient. Pt was there for mammo and apparently either the pt felt something or the dr saw something that warranted the [name]. I went ahead and gave the VO but she said she will need you to sign off on this at some point today if possible. Thanks!

## 2015-01-04 NOTE — Telephone Encounter (Signed)
I have not seen her since last fall so I do not think it was a lump I felt  I will not be back into the office until tomorrow  If they want to send me something electronically I will sign it

## 2015-01-04 NOTE — Telephone Encounter (Signed)
They put [name] order in chart electronically, and it was the dr doing mammogram or pt that saw something that warranted the [name] not Dr. [name]

## 2015-02-04 LAB — HM DIABETES EYE EXAM

## 2015-03-01 ENCOUNTER — Other Ambulatory Visit: Payer: Self-pay | Admitting: Family Medicine

## 2015-03-17 ENCOUNTER — Telehealth: Payer: Self-pay | Admitting: Family Medicine

## 2015-03-17 DIAGNOSIS — R7309 Other abnormal glucose: Secondary | ICD-10-CM

## 2015-03-17 DIAGNOSIS — E785 Hyperlipidemia, unspecified: Secondary | ICD-10-CM

## 2015-03-17 DIAGNOSIS — R739 Hyperglycemia, unspecified: Secondary | ICD-10-CM

## 2015-03-17 DIAGNOSIS — I10 Essential (primary) hypertension: Secondary | ICD-10-CM

## 2015-03-17 DIAGNOSIS — E039 Hypothyroidism, unspecified: Secondary | ICD-10-CM

## 2015-03-17 NOTE — Telephone Encounter (Signed)
-----   Message from Marchia Bond sent at 03/14/2015  9:11 AM EST ----- Regarding: Cpx labs Mon 11/14 need orders, thanks :-) Please order  future cpx labs for pt's upcoming lab appt. Thanks Aniceto Boss

## 2015-03-19 ENCOUNTER — Other Ambulatory Visit (INDEPENDENT_AMBULATORY_CARE_PROVIDER_SITE_OTHER): Payer: Medicare Other

## 2015-03-19 DIAGNOSIS — E785 Hyperlipidemia, unspecified: Secondary | ICD-10-CM

## 2015-03-19 DIAGNOSIS — R739 Hyperglycemia, unspecified: Secondary | ICD-10-CM | POA: Diagnosis not present

## 2015-03-19 DIAGNOSIS — E039 Hypothyroidism, unspecified: Secondary | ICD-10-CM | POA: Diagnosis not present

## 2015-03-19 DIAGNOSIS — I10 Essential (primary) hypertension: Secondary | ICD-10-CM | POA: Diagnosis not present

## 2015-03-19 LAB — CBC WITH DIFFERENTIAL/PLATELET
BASOS PCT: 0.5 % (ref 0.0–3.0)
Basophils Absolute: 0 10*3/uL (ref 0.0–0.1)
EOS ABS: 0.3 10*3/uL (ref 0.0–0.7)
Eosinophils Relative: 4.1 % (ref 0.0–5.0)
HEMATOCRIT: 45.9 % (ref 36.0–46.0)
HEMOGLOBIN: 15.4 g/dL — AB (ref 12.0–15.0)
LYMPHS PCT: 30.4 % (ref 12.0–46.0)
Lymphs Abs: 2.4 10*3/uL (ref 0.7–4.0)
MCHC: 33.6 g/dL (ref 30.0–36.0)
MCV: 90.4 fl (ref 78.0–100.0)
MONO ABS: 0.5 10*3/uL (ref 0.1–1.0)
Monocytes Relative: 5.8 % (ref 3.0–12.0)
Neutro Abs: 4.8 10*3/uL (ref 1.4–7.7)
Neutrophils Relative %: 59.2 % (ref 43.0–77.0)
Platelets: 308 10*3/uL (ref 150.0–400.0)
RBC: 5.08 Mil/uL (ref 3.87–5.11)
RDW: 13.2 % (ref 11.5–15.5)
WBC: 8 10*3/uL (ref 4.0–10.5)

## 2015-03-19 LAB — COMPREHENSIVE METABOLIC PANEL
ALK PHOS: 69 U/L (ref 39–117)
ALT: 24 U/L (ref 0–35)
AST: 25 U/L (ref 0–37)
Albumin: 4.2 g/dL (ref 3.5–5.2)
BILIRUBIN TOTAL: 0.6 mg/dL (ref 0.2–1.2)
BUN: 13 mg/dL (ref 6–23)
CALCIUM: 9.7 mg/dL (ref 8.4–10.5)
CO2: 26 mEq/L (ref 19–32)
CREATININE: 0.89 mg/dL (ref 0.40–1.20)
Chloride: 102 mEq/L (ref 96–112)
GFR: 67.42 mL/min (ref >60.00)
Glucose, Bld: 191 mg/dL — ABNORMAL HIGH (ref 70–99)
Potassium: 4.3 mEq/L (ref 3.5–5.1)
SODIUM: 137 meq/L (ref 135–145)
TOTAL PROTEIN: 7.7 g/dL (ref 6.0–8.3)

## 2015-03-19 LAB — LIPID PANEL
CHOLESTEROL: 204 mg/dL — AB (ref 0–200)
HDL: 39 mg/dL — ABNORMAL LOW (ref >39.00)
NonHDL: 164.8
Total CHOL/HDL Ratio: 5
Triglycerides: 294 mg/dL — ABNORMAL HIGH (ref 0.0–149.0)
VLDL: 58.8 mg/dL — AB (ref 0.0–40.0)

## 2015-03-19 LAB — LDL CHOLESTEROL, DIRECT: LDL DIRECT: 93 mg/dL

## 2015-03-19 LAB — HEMOGLOBIN A1C: HEMOGLOBIN A1C: 7.4 % — AB (ref 4.6–6.5)

## 2015-03-19 LAB — TSH: TSH: 1 u[IU]/mL (ref 0.35–4.50)

## 2015-03-26 ENCOUNTER — Encounter: Payer: Self-pay | Admitting: Family Medicine

## 2015-03-26 ENCOUNTER — Ambulatory Visit (INDEPENDENT_AMBULATORY_CARE_PROVIDER_SITE_OTHER): Payer: Medicare Other | Admitting: Family Medicine

## 2015-03-26 VITALS — BP 128/70 | HR 72 | Temp 98.2°F | Ht 66.0 in | Wt 192.5 lb

## 2015-03-26 DIAGNOSIS — Z0000 Encounter for general adult medical examination without abnormal findings: Secondary | ICD-10-CM

## 2015-03-26 DIAGNOSIS — E039 Hypothyroidism, unspecified: Secondary | ICD-10-CM

## 2015-03-26 DIAGNOSIS — E669 Obesity, unspecified: Secondary | ICD-10-CM | POA: Diagnosis not present

## 2015-03-26 DIAGNOSIS — I10 Essential (primary) hypertension: Secondary | ICD-10-CM | POA: Diagnosis not present

## 2015-03-26 DIAGNOSIS — Z23 Encounter for immunization: Secondary | ICD-10-CM | POA: Diagnosis not present

## 2015-03-26 DIAGNOSIS — R739 Hyperglycemia, unspecified: Secondary | ICD-10-CM

## 2015-03-26 DIAGNOSIS — E785 Hyperlipidemia, unspecified: Secondary | ICD-10-CM

## 2015-03-26 MED ORDER — LEVOTHYROXINE SODIUM 125 MCG PO TABS: 125.0000 ug | ORAL_TABLET | Freq: Every day | ORAL | Status: DC

## 2015-03-26 NOTE — Progress Notes (Signed)
Pre visit review using our clinic review tool, if applicable. No additional management support is needed unless otherwise documented below in the visit note. 

## 2015-03-26 NOTE — Assessment & Plan Note (Signed)
Discussed how this problem influences overall health and the risks it imposes  Reviewed plan for weight loss with lower calorie diet (via better food choices and also portion control or program like weight watchers) and exercise building up to or more than 30 minutes 5 days per week including some aerobic activity   Now has  Lab Results  Component Value Date   HGBA1C 7.4* 03/19/2015    Pt is motivated to work hard on wt loss to  Help blood sugar Plan outlined with her for diet and exercise

## 2015-03-26 NOTE — Assessment & Plan Note (Signed)
Now in the diabetic range Lab Results  Component Value Date   HGBA1C 7.4* 03/19/2015   Wt gain disc  Diet and exercise discussed  Will work on wt loss and low glycemic diet (this was reviewed and info given)- then f/u 3 mo with lab prior At that time will disc ref to DM ed and also testing equip or med if necessary

## 2015-03-26 NOTE — Assessment & Plan Note (Signed)
Hypothyroidism  Pt has no clinical changes No change in energy level/ hair or skin/ edema and no tremor Lab Results  Component Value Date   TSH 1.00 03/19/2015

## 2015-03-26 NOTE — Assessment & Plan Note (Signed)
Reviewed health habits including diet and exercise and skin cancer prevention Reviewed appropriate screening tests for age  Also reviewed health mt list, fam hx and immunization status , as well as social and family history   See HPI Labs reviewed  You have blood sugar in the diabetic range Reduce portions Reduce starches and sugar  Aim for exercise 5 days per week 30 or more minutes (work up to it) Weight loss will help  Follow up with me in 3 months with labs prior You need a Tdap vaccine to keep the baby- take a look at the handout I gave you  Also work on an Advance directive (blue packet)

## 2015-03-26 NOTE — Patient Instructions (Signed)
You have blood sugar in the diabetic range Reduce portions Reduce starches and sugar  Aim for exercise 5 days per week 30 or more minutes (work up to it) Weight loss will help  Follow up with me in 3 months with labs prior You need a Tdap vaccine to keep the baby- take a look at the handout I gave you  Also work on an Advance directive (blue packet) Follow up in 3 months with labs prior

## 2015-03-26 NOTE — Assessment & Plan Note (Signed)
Disc goals for lipids and reasons to control them Rev labs with pt Rev low sat fat diet in detail Triglycerides are high - poss due to high glucose Will follow closely

## 2015-03-26 NOTE — Assessment & Plan Note (Signed)
bp in fair control at this time  BP Readings from Last 1 Encounters:  03/26/15 128/70   No changes needed Disc lifstyle change with low sodium diet and exercise  Labs reviewed

## 2015-03-26 NOTE — Progress Notes (Signed)
Subjective:    Patient ID: Carla Mccoy, female    DOB: [birth year], 64 y.o.   MRN: [id number]  HPI Here for annual medicare wellness visit as well as chronic/acute medical problems and annual preventative exam   I have personally reviewed the Medicare Annual Wellness questionnaire and have noted 1. The patient's medical and social history 2. Their use of alcohol, tobacco or illicit drugs 3. Their current medications and supplements 4. The patient's functional ability including ADL's, fall risks, home safety risks and hearing or visual             impairment. 5. Diet and physical activities 6. Evidence for depression or mood disorders  The patients weight, height, BMI have been recorded in the chart and visual acuity is per eye clinic.  I have made referrals, counseling and provided education to the patient based review of the above and I have provided the pt with a written personalized care plan for preventive services. Reviewed and updated provider list, see scanned forms.  Keeping 47 mo old grand daughter -enjoys it  Is a little tired at the end of the week   Her breast wound is still healing from 2ndary intention - no infection No surgeries this year so far   See scanned forms.  Routine anticipatory guidance given to patient.  See health maintenance. Colon cancer screening 3/08 Breast cancer screening mm 9/16  -was ok (6 mo f/u) - personal hx of breast cancer (she did take tamoxifen) Self breast exam- still has healing wound/ doing better  Flu vaccine - today Has had a hysterectomy  Tetanus vaccine 2010- unsure if it was a Tdap prevnar vaccine 11/14 , PNA 4/13  Zoster vaccine - has had shingles in the past (also caring for a young baby) - may consider later  Hep C screening -not interested/not high risk  Advance directive - does not have living will and POA  Cognitive function addressed- see scanned forms- and if abnormal then additional documentation follows.  No major  concerns about memory   PMH and SH reviewed  Meds, vitals, and allergies reviewed.   ROS: See HPI.  Otherwise negative.    Wt is  is up 8 lb with bmi of 31  Hard- with caring for baby Exercise 20 min at least 3 times per week Admits she needs to eat better   bp is up on first check slightly  today  No cp or palpitations or headaches or edema  No side effects to medicines  BP Readings from Last 3 Encounters:  03/26/15 140/80  03/24/14 124/78  12/26/13 145/70     Hypothyroidism  Pt has no clinical changes No change in energy level/ hair or skin/ edema and no tremor Lab Results  Component Value Date   TSH 1.00 03/19/2015      Hyperglycemia Lab Results  Component Value Date   HGBA1C 7.4* 03/19/2015   Up from 6.2   Lipids  Lab Results  Component Value Date   CHOL 204* 03/19/2015   CHOL 190 03/17/2014   CHOL 188 03/22/2013   Lab Results  Component Value Date   HDL 39.00* 03/19/2015   HDL 30.60* 03/17/2014   HDL 37.20* 03/22/2013   Lab Results  Component Value Date   LDLCALC 125* 03/06/2006   Lab Results  Component Value Date   TRIG 294.0* 03/19/2015   TRIG 286.0* 03/17/2014   TRIG 201.0* 03/22/2013   Lab Results  Component Value Date   CHOLHDL 5  03/19/2015   CHOLHDL 6 03/17/2014   CHOLHDL 5 03/22/2013   Lab Results  Component Value Date   LDLDIRECT 93.0 03/19/2015   LDLDIRECT 97.9 03/17/2014   LDLDIRECT 116.1 03/22/2013    High triglycerids- likely from glucose being high    Patient Active Problem List   Diagnosis Date Noted  . Routine general medical examination at a health care facility 03/26/2015  . Colon cancer screening 03/24/2014  . Encounter for Medicare annual wellness exam 03/24/2014  . Hyperglycemia 03/17/2014  . Status post debridement 09/30/2013  . Trigger finger, acquired 08/26/2013  . Benign paroxysmal positional vertigo 08/26/2013  . OSA (obstructive sleep apnea) 08/10/2013  . Chronic mastitis of left breast 07/04/2013  .  Breast abscess s/p I&D 09/14/2013 03/28/2013  . Left breast abscess 03/23/2013  . Breast cancer ([place]) 02/10/2012  . Routine health maintenance 09/06/2011  . LEG CRAMPS 04/12/2009  . 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 BACK PAIN, CHRONIC 05/10/2007  . PEPTIC ULCER DISEASE, HX OF 05/10/2007  . HYSTERECTOMY, HX OF 05/10/2007  . TONSILLECTOMY, HX OF 05/10/2007  . Hypothyroidism 01/28/2007  . Hyperlipidemia 01/28/2007  . OBESITY NOS 01/28/2007  . COMMON MIGRAINE 01/28/2007  . Essential hypertension 01/28/2007  . FATTY LIVER DISEASE 01/28/2007  . ABNORMAL GLUCOSE NEC 01/28/2007  . ANGIOEDEMA 01/28/2007  . CARPAL TUNNEL SYNDROME, HX OF 01/27/2007   Past Medical History  Diagnosis Date  . Tachy-brady syndrome ([place])   . Backache, unspecified   . Personal history of peptic ulcer disease   . 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 Other and unspecified hyperlipidemia   . Other abnormal glucose   . Obesity, unspecified   . 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 Other chronic nonalcoholic liver disease   . Migraine without aura, without mention of intractable migraine without mention of status migrainosus   . Unspecified hypothyroidism   . breast ca dx'd 06/2009    breast cancer left - lumpectomy  . Unspecified essential hypertension 09/12/13    remote history; no current treatment  . Full dentures   . Wears glasses    Past Surgical History  Procedure Laterality Date  . Laparoscopy abdomen diagnostic    . Tonsillectomy    . Breast lumpectomy Left     Feb '11  . Breast biopsy Left     in the 80's - benign tissue  . Abdominal hysterectomy  1988  . Tubal ligation  1971  . Incision and drainage of wound Left 09/14/2013    Procedure: IRRIGATION AND DEBRIDEMENT LEFT BREAST ABSCESS;  Surgeon: Marcello Moores A. Cornett, MD;  Location: [facility];  Service: General;  Laterality: Left;  . Incision and drainage of wound Left  12/21/2013    Procedure: DEBRIDEMENT OF LEFT BREAST WOUND WITH PLACEMENT OF A CELL;  Surgeon: Theodoro Kos, DO;  Location: [facility];  Service: Plastics;  Laterality: Left;   Social History  Substance Use Topics  . Smoking status: Never Smoker   . Smokeless tobacco: Never Used  . Alcohol Use: No   Family History  Problem Relation Age of Onset  . Peripheral vascular disease Mother   . Coronary artery disease Mother   . Hyperlipidemia Mother   . Heart disease Mother     CAD/fatal MI  . Diabetes Mother   . Stroke Mother   . Diabetes Sister   . Hypothyroidism Sister   . Diabetes Brother   .  Hyperlipidemia Brother   . Heart disease Brother    Allergies  Allergen Reactions  . Metoprolol     Lips swelled/ hands swelled and her skin turned red   . Sulfonamide Derivatives    Current Outpatient Prescriptions on File Prior to Visit  Medication Sig Dispense Refill  . Cholecalciferol (VITAMIN D3) 2000 UNITS TABS Take by mouth.      . Multiple Vitamins-Minerals (MULTIVITAMIN,TX-MINERALS) tablet Take 1 tablet by mouth 2 (two) times a week.     . Omega-3 Fatty Acids (OMEGA 3 PO) Take 1 capsule by mouth 2 (two) times a week.     . vitamin C (ASCORBIC ACID) 500 MG tablet Take 500 mg by mouth daily.    . vitamin E 100 UNIT capsule Take 100 Units by mouth daily.     [name] zinc gluconate 50 MG tablet Take 50 mg by mouth once a week.      No current facility-administered medications on file prior to visit.    Review of Systems    Review of Systems  Constitutional: Negative for fever, appetite change,  and unexpected weight change.  Eyes: Negative for pain and visual disturbance.  Respiratory: Negative for cough and shortness of breath.   Cardiovascular: Negative for cp or palpitations    Gastrointestinal: Negative for nausea, diarrhea and constipation.  Genitourinary: Negative for urgency and pos for  frequency. pos for inc thirst  Skin: Negative for pallor or rash     Neurological: Negative for weakness, light-headedness, numbness and headaches.  Hematological: Negative for adenopathy. Does not bruise/bleed easily.  Psychiatric/Behavioral: Negative for dysphoric mood. The patient is not nervous/anxious.      Objective:   Physical Exam  Constitutional: She appears well-developed and well-nourished. No distress.  obese and well appearing   HENT:  Head: Normocephalic and atraumatic.  Right Ear: External ear normal.  Left Ear: External ear normal.  Mouth/Throat: Oropharynx is clear and moist.  Eyes: Conjunctivae and EOM are normal. Pupils are equal, round, and reactive to light. No scleral icterus.  Neck: Normal range of motion. Neck supple. No JVD present. Carotid bruit is not present. No thyromegaly present.  Cardiovascular: Normal rate, regular rhythm, normal heart sounds and intact distal pulses.  Exam reveals no gallop.   Pulmonary/Chest: Effort normal and breath sounds normal. No respiratory distress. She has no wheezes. She exhibits no tenderness.  Abdominal: Soft. Bowel sounds are normal. She exhibits no distension, no abdominal bruit and no mass. There is no tenderness.  Genitourinary: No breast swelling, tenderness, discharge or bleeding.  L breast- very small wound remains that is healing from 2ndary intention- 2-3 mm in the center of a scar (in nipple area)  No drainage  Otherwise no M or skin changes of ether breast   Musculoskeletal: Normal range of motion. She exhibits no edema or tenderness.  Lymphadenopathy:    She has no cervical adenopathy.  Neurological: She is alert. She has normal reflexes. No cranial nerve deficit. She exhibits normal muscle tone. Coordination normal.  Skin: Skin is warm and dry. No rash noted. No erythema. No pallor.  Psychiatric: She has a normal mood and affect.          Assessment & Plan:   Problem List Items Addressed This Visit      Cardiovascular and Mediastinum   Essential hypertension    bp  in fair control at this time  BP Readings from Last 1 Encounters:  03/26/15 128/70   No changes needed Disc lifstyle  change with low sodium diet and exercise  Labs reviewed         Endocrine   Hypothyroidism    Hypothyroidism  Pt has no clinical changes No change in energy level/ hair or skin/ edema and no tremor Lab Results  Component Value Date   TSH 1.00 03/19/2015          Relevant Medications   levothyroxine (SYNTHROID, LEVOTHROID) 125 MCG tablet     Other   Encounter for Medicare annual wellness exam    Reviewed health habits including diet and exercise and skin cancer prevention Reviewed appropriate screening tests for age  Also reviewed health mt list, fam hx and immunization status , as well as social and family history   See HPI Labs reviewed  You have blood sugar in the diabetic range Reduce portions Reduce starches and sugar  Aim for exercise 5 days per week 30 or more minutes (work up to it) Weight loss will help  Follow up with me in 3 months with labs prior You need a Tdap vaccine to keep the baby- take a look at the handout I gave you  Also work on an Advance directive (blue packet)      Hyperglycemia    Now in the diabetic range Lab Results  Component Value Date   HGBA1C 7.4* 03/19/2015   Wt gain disc  Diet and exercise discussed  Will work on wt loss and low glycemic diet (this was reviewed and info given)- then f/u 3 mo with lab prior At that time will disc ref to DM ed and also testing equip or med if necessary       Hyperlipidemia    Disc goals for lipids and reasons to control them Rev labs with pt Rev low sat fat diet in detail Triglycerides are high - poss due to high glucose Will follow closely      OBESITY NOS    Discussed how this problem influences overall health and the risks it imposes  Reviewed plan for weight loss with lower calorie diet (via better food choices and also portion control or program like weight watchers)  and exercise building up to or more than 30 minutes 5 days per week including some aerobic activity   Now has  Lab Results  Component Value Date   HGBA1C 7.4* 03/19/2015    Pt is motivated to work hard on wt loss to  Help blood sugar Plan outlined with her for diet and exercise       Routine general medical examination at a health care facility - Primary    Reviewed health habits including diet and exercise and skin cancer prevention Reviewed appropriate screening tests for age  Also reviewed health mt list, fam hx and immunization status , as well as social and family history   See HPI Labs reviewed  You have blood sugar in the diabetic range Reduce portions Reduce starches and sugar  Aim for exercise 5 days per week 30 or more minutes (work up to it) Weight loss will help  Follow up with me in 3 months with labs prior You need a Tdap vaccine to keep the baby- take a look at the handout I gave you  Also work on an Advance directive (blue packet)       Other Visit Diagnoses    Need for influenza vaccination        Relevant Orders    Flu Vaccine QUAD 36+ mos PF IM (Fluarix & Fluzone  Quad PF) (Completed)

## 2015-07-02 ENCOUNTER — Other Ambulatory Visit: Payer: Medicare Other

## 2015-07-02 ENCOUNTER — Ambulatory Visit: Payer: Medicare Other | Admitting: Family Medicine

## 2015-07-06 ENCOUNTER — Ambulatory Visit: Payer: Medicare Other | Admitting: Family Medicine

## 2015-07-17 ENCOUNTER — Other Ambulatory Visit: Payer: Self-pay | Admitting: Family Medicine

## 2015-07-17 DIAGNOSIS — N6320 Unspecified lump in the left breast, unspecified quadrant: Secondary | ICD-10-CM

## 2015-07-26 ENCOUNTER — Other Ambulatory Visit (INDEPENDENT_AMBULATORY_CARE_PROVIDER_SITE_OTHER): Payer: PPO

## 2015-07-26 DIAGNOSIS — R739 Hyperglycemia, unspecified: Secondary | ICD-10-CM | POA: Diagnosis not present

## 2015-07-26 LAB — BASIC METABOLIC PANEL
BUN: 16 mg/dL (ref 6–23)
CHLORIDE: 101 meq/L (ref 96–112)
CO2: 30 meq/L (ref 19–32)
CREATININE: 0.89 mg/dL (ref 0.40–1.20)
Calcium: 9.8 mg/dL (ref 8.4–10.5)
GFR: 67.34 mL/min (ref >60.00)
Glucose, Bld: 172 mg/dL — ABNORMAL HIGH (ref 70–99)
POTASSIUM: 4.3 meq/L (ref 3.5–5.1)
Sodium: 138 mEq/L (ref 135–145)

## 2015-07-26 LAB — HEMOGLOBIN A1C: HEMOGLOBIN A1C: 7.9 % — AB (ref 4.6–6.5)

## 2015-07-27 ENCOUNTER — Ambulatory Visit
Admission: RE | Admit: 2015-07-27 | Discharge: 2015-07-27 | Disposition: A | Discharge status: Home / Self Care | Payer: PPO | Source: Ambulatory Visit | Attending: Family Medicine | Admitting: Family Medicine

## 2015-07-27 ENCOUNTER — Other Ambulatory Visit: Payer: Self-pay | Admitting: Family Medicine

## 2015-07-27 DIAGNOSIS — N6320 Unspecified lump in the left breast, unspecified quadrant: Secondary | ICD-10-CM

## 2015-07-27 DIAGNOSIS — N6489 Other specified disorders of breast: Secondary | ICD-10-CM | POA: Diagnosis not present

## 2015-07-27 DIAGNOSIS — N63 Unspecified lump in breast: Secondary | ICD-10-CM | POA: Diagnosis not present

## 2015-07-30 ENCOUNTER — Encounter: Payer: Self-pay | Admitting: Family Medicine

## 2015-07-30 ENCOUNTER — Ambulatory Visit (INDEPENDENT_AMBULATORY_CARE_PROVIDER_SITE_OTHER): Payer: PPO | Admitting: Family Medicine

## 2015-07-30 VITALS — BP 132/80 | HR 83 | Temp 98.1°F | Ht 66.0 in | Wt 191.5 lb

## 2015-07-30 DIAGNOSIS — E1165 Type 2 diabetes mellitus with hyperglycemia: Secondary | ICD-10-CM

## 2015-07-30 DIAGNOSIS — E669 Obesity, unspecified: Secondary | ICD-10-CM | POA: Diagnosis not present

## 2015-07-30 DIAGNOSIS — IMO0001 Reserved for inherently not codable concepts without codable children: Secondary | ICD-10-CM

## 2015-07-30 MED ORDER — METFORMIN HCL 500 MG PO TABS: 500.0000 mg | ORAL_TABLET | Freq: Two times a day (BID) | ORAL | Status: DC

## 2015-07-30 NOTE — Patient Instructions (Addendum)
Check in with [facility] pharmacy regarding your diabetic education - you would benefit from a class  [facility] them  Goal for exercise is 5 days per week  Try to cut carbohydrates - portions and make good choices  - stay away from sweets and simple sugars as much as you can  Take metformin 500 mg twice daily If any intolerable side effects let us know    Follow up in 3 months with labs prior

## 2015-07-30 NOTE — Assessment & Plan Note (Signed)
Worse since last check w/o lifestyle change  Lab Results  Component Value Date   HGBA1C 7.9* 07/26/2015   Will begin metformin 500 bid  Disc goals for bp /cholesterol and A1C  Strongly suggest DM teaching-pt plans to check out program at [facility] , can call for a referral  Would like to avoid checking blood glucose if possible  Handouts given on DM diet/carb control/ diabetes in general F/u 3 mo with lab prior

## 2015-07-30 NOTE — Progress Notes (Signed)
Subjective:    Patient ID: Carla Mccoy, female    DOB: [birth year], 65 y.o.   MRN: [id number]  HPI Here for f/u of hyperglycemia/ diabetes   Has not watched her diet like we discussed at our last visit  Exercises with her grand daughter 3 d per week- exercise video   Still eating chips and dip and breads   Wt is down 1 lb with bmi of 30  A1C is up to 7.9 from 7.4 Glucose was 172  Drinks a lot of water- unsure if she is thirsty or just out of habit  Urinates due to that  Drank some coke recently when she is sick - not usual for her   Has cut out making sweet tea   Mother and grandmother DM 2  Is obese   No numbness in feet   Still has open wound on her R breast - that is doing better (very small area-not infected)-keeps it clean with saline solution Has an enlarged LN in axilla  Thinks it is reactive    Patient Active Problem List   Diagnosis Date Noted  . Routine general medical examination at a health care facility 03/26/2015  . Colon cancer screening 03/24/2014  . Encounter for Medicare annual wellness exam 03/24/2014  . Diabetes type 2, uncontrolled ([place]) 03/17/2014  . Status post debridement 09/30/2013  . Trigger finger, acquired 08/26/2013  . Benign paroxysmal positional vertigo 08/26/2013  . OSA (obstructive sleep apnea) 08/10/2013  . Chronic mastitis of left breast 07/04/2013  . Breast abscess s/p I&D 09/14/2013 03/28/2013  . Left breast abscess 03/23/2013  . Breast cancer ([place]) 02/10/2012  . Routine health maintenance 09/06/2011  . LEG CRAMPS 04/12/2009  . 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 BACK PAIN, CHRONIC 05/10/2007  . PEPTIC ULCER DISEASE, HX OF 05/10/2007  . HYSTERECTOMY, HX OF 05/10/2007  . TONSILLECTOMY, HX OF 05/10/2007  . Hypothyroidism 01/28/2007  . Hyperlipidemia 01/28/2007  . OBESITY NOS 01/28/2007  . COMMON MIGRAINE 01/28/2007  . Essential hypertension 01/28/2007  . FATTY LIVER DISEASE 01/28/2007  . ABNORMAL GLUCOSE NEC 01/28/2007  .  ANGIOEDEMA 01/28/2007  . CARPAL TUNNEL SYNDROME, HX OF 01/27/2007   Past Medical History  Diagnosis Date  . Tachy-brady syndrome ([place])   . Backache, unspecified   . Personal history of peptic ulcer disease   . 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 Other and unspecified hyperlipidemia   . Other abnormal glucose   . Obesity, unspecified   . 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 Other chronic nonalcoholic liver disease   . Migraine without aura, without mention of intractable migraine without mention of status migrainosus   . Unspecified hypothyroidism   . breast ca dx'd 06/2009    breast cancer left - lumpectomy  . Unspecified essential hypertension 09/12/13    remote history; no current treatment  . Full dentures   . Wears glasses    Past Surgical History  Procedure Laterality Date  . Laparoscopy abdomen diagnostic    . Tonsillectomy    . Breast lumpectomy Left     Feb '11  . Breast biopsy Left     in the 80's - benign tissue  . Abdominal hysterectomy  1988  . Tubal ligation  1971  . Incision and drainage of wound Left 09/14/2013    Procedure: IRRIGATION AND DEBRIDEMENT LEFT BREAST ABSCESS;  Surgeon: Marcello Moores A. Cornett, MD;  Location: [facility];  Service: General;  Laterality: Left;  . Incision and drainage of wound Left 12/21/2013    Procedure: DEBRIDEMENT OF LEFT BREAST WOUND WITH PLACEMENT OF A CELL;  Surgeon: Theodoro Kos, DO;  Location: [facility];  Service: Plastics;  Laterality: Left;   Social History  Substance Use Topics  . Smoking status: Never Smoker   . Smokeless tobacco: Never Used  . Alcohol Use: No   Family History  Problem Relation Age of Onset  . Peripheral vascular disease Mother   . Coronary artery disease Mother   . Hyperlipidemia Mother   . Heart disease Mother     CAD/fatal MI  . Diabetes Mother   . Stroke Mother   . Diabetes Sister   . Hypothyroidism  Sister   . Diabetes Brother   . Hyperlipidemia Brother   . Heart disease Brother    Allergies  Allergen Reactions  . Metoprolol     Lips swelled/ hands swelled and her skin turned red   . Sulfonamide Derivatives    Current Outpatient Prescriptions on File Prior to Visit  Medication Sig Dispense Refill  . Cholecalciferol (VITAMIN D3) 2000 UNITS TABS Take by mouth.      . levothyroxine (SYNTHROID, LEVOTHROID) 125 MCG tablet Take 1 tablet (125 mcg total) by mouth daily. 90 tablet 3  . Multiple Vitamins-Minerals (MULTIVITAMIN,TX-MINERALS) tablet Take 1 tablet by mouth 2 (two) times a week.     . Omega-3 Fatty Acids (OMEGA 3 PO) Take 1 capsule by mouth 2 (two) times a week.     . vitamin C (ASCORBIC ACID) 500 MG tablet Take 500 mg by mouth daily.    . vitamin E 100 UNIT capsule Take 100 Units by mouth daily.     [name] zinc gluconate 50 MG tablet Take 50 mg by mouth once a week.      No current facility-administered medications on file prior to visit.    Review of Systems Review of Systems  Constitutional: Negative for fever, appetite change, fatigue and unexpected weight change.  Eyes: Negative for pain and visual disturbance.  Respiratory: Negative for cough and shortness of breath.   Cardiovascular: Negative for cp or palpitations    Gastrointestinal: Negative for nausea, diarrhea and constipation.  Genitourinary: Negative for urgency and frequency.  Skin: Negative for pallor or rash   Neurological: Negative for weakness, light-headedness, numbness and headaches.  Hematological: Negative for adenopathy. Does not bruise/bleed easily.  Psychiatric/Behavioral: Negative for dysphoric mood. The patient is not nervous/anxious.         Objective:   Physical Exam  Constitutional: She appears well-developed and well-nourished. No distress.  obese and well appearing   HENT:  Head: Normocephalic and atraumatic.  Mouth/Throat: Oropharynx is clear and moist.  Eyes: Conjunctivae and EOM are  normal. Pupils are equal, round, and reactive to light.  Neck: Normal range of motion. Neck supple. No JVD present. Carotid bruit is not present. No thyromegaly present.  Cardiovascular: Normal rate, regular rhythm, normal heart sounds and intact distal pulses.  Exam reveals no gallop.   Pulmonary/Chest: Effort normal and breath sounds normal. No respiratory distress. She has no wheezes. She has no rales.  No crackles  Abdominal: Soft. Bowel sounds are normal. She exhibits no distension, no abdominal bruit and no mass. There is no tenderness.  Musculoskeletal: She exhibits no edema.  Lymphadenopathy:    She has no cervical adenopathy.  Neurological: She is alert. She has normal reflexes.  Skin: Skin is  warm and dry. No rash noted.  Tiny hole in scar in L breast -not actively draining Wound is almost healed   Psychiatric: She has a normal mood and affect.          Assessment & Plan:   Problem List Items Addressed This Visit      Endocrine   Diabetes type 2, uncontrolled ([place]) - Primary    Worse since last check w/o lifestyle change  Lab Results  Component Value Date   HGBA1C 7.9* 07/26/2015   Will begin metformin 500 bid  Disc goals for bp /cholesterol and A1C  Strongly suggest DM teaching-pt plans to check out program at [facility] , can call for a referral  Would like to avoid checking blood glucose if possible  Handouts given on DM diet/carb control/ diabetes in general F/u 3 mo with lab prior        Relevant Medications   metFORMIN (GLUCOPHAGE) 500 MG tablet     Other   OBESITY NOS    Pt understand link between this and DM2 Discussed how this problem influences overall health and the risks it imposes  Reviewed plan for weight loss with lower calorie diet (via better food choices and also portion control or program like weight watchers) and exercise building up to or more than 30 minutes 5 days per week including some aerobic activity         Relevant Medications    metFORMIN (GLUCOPHAGE) 500 MG tablet

## 2015-07-30 NOTE — Assessment & Plan Note (Signed)
Pt understand link between this and DM2 Discussed how this problem influences overall health and the risks it imposes  Reviewed plan for weight loss with lower calorie diet (via better food choices and also portion control or program like weight watchers) and exercise building up to or more than 30 minutes 5 days per week including some aerobic activity

## 2015-07-30 NOTE — Progress Notes (Signed)
Pre visit review using our clinic review tool, if applicable. No additional management support is needed unless otherwise documented below in the visit note. 

## 2015-08-06 ENCOUNTER — Telehealth: Payer: Self-pay | Admitting: Family Medicine

## 2015-08-06 DIAGNOSIS — IMO0001 Reserved for inherently not codable concepts without codable children: Secondary | ICD-10-CM

## 2015-08-06 DIAGNOSIS — E1165 Type 2 diabetes mellitus with hyperglycemia: Principal | ICD-10-CM

## 2015-08-06 NOTE — Telephone Encounter (Signed)
Ref done Will route to [place] 

## 2015-08-06 NOTE — Telephone Encounter (Signed)
Patient called [facility] to make an appointment for diabetes education.  [facility] told her she'd need a referral from her PCP.  Patient would like to get a referral to [name]. Patient prefers Tuesday or Thursday night at 5:15.

## 2015-09-11 ENCOUNTER — Encounter: Payer: PPO | Attending: Family Medicine | Admitting: *Deleted

## 2015-09-11 ENCOUNTER — Ambulatory Visit: Payer: PPO

## 2015-09-11 VITALS — Ht 66.5 in | Wt 191.7 lb

## 2015-09-11 DIAGNOSIS — E119 Type 2 diabetes mellitus without complications: Secondary | ICD-10-CM | POA: Insufficient documentation

## 2015-09-11 DIAGNOSIS — E1165 Type 2 diabetes mellitus with hyperglycemia: Secondary | ICD-10-CM

## 2015-09-11 DIAGNOSIS — E118 Type 2 diabetes mellitus with unspecified complications: Secondary | ICD-10-CM

## 2015-09-11 DIAGNOSIS — IMO0002 Reserved for concepts with insufficient information to code with codable children: Secondary | ICD-10-CM

## 2015-09-11 NOTE — Progress Notes (Signed)
Patient was seen on 09/11/15 for the first of a series of three diabetes self-management courses at [facility].  Patient Education Plan per assessed needs and concerns is to attend four course education program for Diabetes Self Management Education.  The following learning objectives were met by the patient during this class:  Describe diabetes  State some common risk factors for diabetes  Defines the role of glucose and insulin  Identifies type of diabetes and pathophysiology  Describe the relationship between diabetes and cardiovascular risk  State the members of the Healthcare Team  States the rationale for glucose monitoring  State when to test glucose  State their individual Target Range  State the importance of logging glucose readings  Describe how to interpret glucose readings  Identifies A1C target  Explain the correlation between A1c and eAG values  State symptoms and treatment of high blood glucose  State symptoms and treatment of low blood glucose  Explain proper technique for glucose testing  Identifies proper sharps disposal  Handouts given during class include:  Living Well with Diabetes book  Carb Counting and Meal Planning book  Meal Plan Card  Carbohydrate guide  Meal planning worksheet  Low Sodium Flavoring Tips  The diabetes portion plate  P1S to eAG Conversion Chart  Diabetes Medications  Diabetes Recommended Care Schedule  Support Group  Diabetes Success Plan  Core Class Satisfaction Survey  Follow-Up Plan:  Attend core 2

## 2015-09-18 ENCOUNTER — Ambulatory Visit: Payer: PPO

## 2015-09-18 DIAGNOSIS — E119 Type 2 diabetes mellitus without complications: Secondary | ICD-10-CM

## 2015-09-18 NOTE — Progress Notes (Signed)

## 2015-09-25 ENCOUNTER — Ambulatory Visit: Payer: PPO

## 2015-10-02 ENCOUNTER — Encounter: Payer: PPO | Admitting: Skilled Nursing Facility1

## 2015-10-02 ENCOUNTER — Encounter: Payer: Self-pay | Admitting: Skilled Nursing Facility1

## 2015-10-02 DIAGNOSIS — E119 Type 2 diabetes mellitus without complications: Secondary | ICD-10-CM

## 2015-10-02 NOTE — Progress Notes (Signed)
Patient was seen on 10/02/15 for the third of a series of three diabetes self-management courses at the [facility]. The following learning objectives were met by the patient during this class:  . State the amount of activity recommended for healthy living . Describe activities suitable for individual needs . Identify ways to regularly incorporate activity into daily life . Identify barriers to activity and ways to over come these barriers  Identify diabetes medications being personally used and their primary action for lowering glucose and possible side effects . Describe role of stress on blood glucose and develop strategies to address psychosocial issues . Identify diabetes complications and ways to prevent them  Explain how to manage diabetes during illness . Evaluate success in meeting personal goal . Establish 2-3 goals that they will plan to diligently work on until they return for the  4-month follow-up visit  Goals:   I will count my carb choices at most meals and snacks  I will be active  minutes or more  times a week  I will take my diabetes medications as scheduled  I will eat less unhealthy fats by eating less   I will test my glucose at least  times a day,  days a week  I will look at patterns in my record book at least  days a month  To help manage stress I will   at least  times a week    Your patient has identified these potential barriers to change:  Motivation Finances Stress Lack of Family Support   Your patient has identified their diabetes self-care support plan as  [facility] Support Group Family Support Magazine Subscriptions On-line Resources    

## 2015-11-02 ENCOUNTER — Other Ambulatory Visit (INDEPENDENT_AMBULATORY_CARE_PROVIDER_SITE_OTHER): Payer: PPO

## 2015-11-02 DIAGNOSIS — E1165 Type 2 diabetes mellitus with hyperglycemia: Secondary | ICD-10-CM

## 2015-11-02 DIAGNOSIS — IMO0001 Reserved for inherently not codable concepts without codable children: Secondary | ICD-10-CM

## 2015-11-02 LAB — COMPREHENSIVE METABOLIC PANEL
ALT: 20 U/L (ref 0–35)
AST: 24 U/L (ref 0–37)
Albumin: 4.2 g/dL (ref 3.5–5.2)
Alkaline Phosphatase: 54 U/L (ref 39–117)
BUN: 13 mg/dL (ref 6–23)
CALCIUM: 9.6 mg/dL (ref 8.4–10.5)
CHLORIDE: 104 meq/L (ref 96–112)
CO2: 30 mEq/L (ref 19–32)
Creatinine, Ser: 0.83 mg/dL (ref 0.40–1.20)
GFR: 72.93 mL/min (ref >60.00)
Glucose, Bld: 128 mg/dL — ABNORMAL HIGH (ref 70–99)
POTASSIUM: 4.3 meq/L (ref 3.5–5.1)
SODIUM: 138 meq/L (ref 135–145)
Total Bilirubin: 0.5 mg/dL (ref 0.2–1.2)
Total Protein: 7.7 g/dL (ref 6.0–8.3)

## 2015-11-02 LAB — MICROALBUMIN / CREATININE URINE RATIO
CREATININE, U: 173.4 mg/dL
MICROALB/CREAT RATIO: 0.4 mg/g (ref 0.0–30.0)

## 2015-11-02 LAB — LIPID PANEL
CHOLESTEROL: 202 mg/dL — AB (ref 0–200)
HDL: 38.4 mg/dL — ABNORMAL LOW (ref >39.00)
NonHDL: 163.93
TRIGLYCERIDES: 330 mg/dL — AB (ref 0.0–149.0)
Total CHOL/HDL Ratio: 5
VLDL: 66 mg/dL — ABNORMAL HIGH (ref 0.0–40.0)

## 2015-11-02 LAB — LDL CHOLESTEROL, DIRECT: Direct LDL: 85 mg/dL

## 2015-11-02 LAB — HEMOGLOBIN A1C: Hgb A1c MFr Bld: 6.1 % (ref 4.6–6.5)

## 2015-11-09 ENCOUNTER — Ambulatory Visit (INDEPENDENT_AMBULATORY_CARE_PROVIDER_SITE_OTHER): Payer: PPO | Admitting: Family Medicine

## 2015-11-09 ENCOUNTER — Encounter: Payer: Self-pay | Admitting: Family Medicine

## 2015-11-09 VITALS — BP 128/78 | HR 78 | Temp 98.0°F | Ht 66.0 in | Wt 185.2 lb

## 2015-11-09 DIAGNOSIS — I10 Essential (primary) hypertension: Secondary | ICD-10-CM | POA: Diagnosis not present

## 2015-11-09 DIAGNOSIS — E669 Obesity, unspecified: Secondary | ICD-10-CM | POA: Diagnosis not present

## 2015-11-09 DIAGNOSIS — E119 Type 2 diabetes mellitus without complications: Secondary | ICD-10-CM | POA: Diagnosis not present

## 2015-11-09 DIAGNOSIS — E785 Hyperlipidemia, unspecified: Secondary | ICD-10-CM | POA: Diagnosis not present

## 2015-11-09 MED ORDER — LISINOPRIL 5 MG PO TABS: 5.0000 mg | ORAL_TABLET | Freq: Every day | ORAL | Status: DC

## 2015-11-09 NOTE — Assessment & Plan Note (Signed)
Now controlled  Lab Results  Component Value Date   HGBA1C 6.1 11/02/2015   This is down from 7.9 with habit change/wt loss and metformin  Added ace low dose for renal protection  Eye exam due in fall

## 2015-11-09 NOTE — Progress Notes (Signed)
Pre visit review using our clinic review tool, if applicable. No additional management support is needed unless otherwise documented below in the visit note. 

## 2015-11-09 NOTE — Progress Notes (Signed)
Subjective:    Patient ID: Carla Mccoy, female    DOB: [birth year], 65 y.o.   MRN: [id number]  HPI Here for f/u of chronic medical problems   Obesity Pt lost 6 lb since last visit (down 7 lb per pt) bmi of 29 (out of obesity range for bmi)  bp is stable today  No cp or palpitations or headaches or edema  No medications currently  BP Readings from Last 3 Encounters:  11/09/15 128/78  07/30/15 132/80  03/26/15 128/70      Diabetes Home sugar results -has not started checking it Went to diabetic teaching  DM diet -staying away from excess carbs and more protien and also dec portion size . (dec bread and potato) Exercise -uses exercise videos and also walks (short distances 3 times per day) Symptoms-none  A1C last  Lab Results  Component Value Date   HGBA1C 6.1 11/02/2015  down from 7.9- impressive  No problems with medications - was put on metformin 500 bid last time  Renal protection- needs ace-will start now, nl microalbumin Last eye exam 10/16 (vision has improved)  Pneumonia vaccine 11/14  Gets a yearly flu shot    Cholesterol Lab Results  Component Value Date   CHOL 202* 11/02/2015   CHOL 204* 03/19/2015   CHOL 190 03/17/2014   Lab Results  Component Value Date   HDL 38.40* 11/02/2015   HDL 39.00* 03/19/2015   HDL 30.60* 03/17/2014   Lab Results  Component Value Date   LDLCALC 125* 03/06/2006   Lab Results  Component Value Date   TRIG 330.0* 11/02/2015   TRIG 294.0* 03/19/2015   TRIG 286.0* 03/17/2014   Lab Results  Component Value Date   CHOLHDL 5 11/02/2015   CHOLHDL 5 03/19/2015   CHOLHDL 6 03/17/2014   Lab Results  Component Value Date   LDLDIRECT 85.0 11/02/2015   LDLDIRECT 93.0 03/19/2015   LDLDIRECT 97.9 03/17/2014    LDL is under 100  HDL is still a point low -will continue to exercise   Patient Active Problem List   Diagnosis Date Noted  . Routine general medical examination at a health care facility 03/26/2015  . Colon  cancer screening 03/24/2014  . Encounter for Medicare annual wellness exam 03/24/2014  . Diabetes type 2, uncontrolled ([place]) 03/17/2014  . Status post debridement 09/30/2013  . Trigger finger, acquired 08/26/2013  . Benign paroxysmal positional vertigo 08/26/2013  . OSA (obstructive sleep apnea) 08/10/2013  . Chronic mastitis of left breast 07/04/2013  . Breast abscess s/p I&D 09/14/2013 03/28/2013  . Left breast abscess 03/23/2013  . Breast cancer ([place]) 02/10/2012  . Routine health maintenance 09/06/2011  . LEG CRAMPS 04/12/2009  . 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 BACK PAIN, CHRONIC 05/10/2007  . PEPTIC ULCER DISEASE, HX OF 05/10/2007  . HYSTERECTOMY, HX OF 05/10/2007  . TONSILLECTOMY, HX OF 05/10/2007  . Hypothyroidism 01/28/2007  . Hyperlipidemia 01/28/2007  . OBESITY NOS 01/28/2007  . COMMON MIGRAINE 01/28/2007  . Essential hypertension 01/28/2007  . FATTY LIVER DISEASE 01/28/2007  . ABNORMAL GLUCOSE NEC 01/28/2007  . ANGIOEDEMA 01/28/2007  . CARPAL TUNNEL SYNDROME, HX OF 01/27/2007   Past Medical History  Diagnosis Date  . Tachy-brady syndrome ([place])   . Backache, unspecified   . Personal history of peptic ulcer disease   . 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 Other and unspecified hyperlipidemia   . Other abnormal glucose   . Obesity,  unspecified   . 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 Other chronic nonalcoholic liver disease   . Migraine without aura, without mention of intractable migraine without mention of status migrainosus   . Unspecified hypothyroidism   . breast ca dx'd 06/2009    breast cancer left - lumpectomy  . Unspecified essential hypertension 09/12/13    remote history; no current treatment  . Full dentures   . Wears glasses   . Diabetes mellitus without complication [facility])    Past Surgical History  Procedure Laterality Date  . Laparoscopy abdomen diagnostic    . Tonsillectomy    .  Breast lumpectomy Left     Feb '11  . Breast biopsy Left     in the 80's - benign tissue  . Abdominal hysterectomy  1988  . Tubal ligation  1971  . Incision and drainage of wound Left 09/14/2013    Procedure: IRRIGATION AND DEBRIDEMENT LEFT BREAST ABSCESS;  Surgeon: Marcello Moores A. Cornett, MD;  Location: [place];  Service: General;  Laterality: Left;  . Incision and drainage of wound Left 12/21/2013    Procedure: DEBRIDEMENT OF LEFT BREAST WOUND WITH PLACEMENT OF A CELL;  Surgeon: Theodoro Kos, DO;  Location: [place];  Service: Plastics;  Laterality: Left;   Social History  Substance Use Topics  . Smoking status: Never Smoker   . Smokeless tobacco: Never Used  . Alcohol Use: No   Family History  Problem Relation Age of Onset  . Peripheral vascular disease Mother   . Coronary artery disease Mother   . Hyperlipidemia Mother   . Heart disease Mother     CAD/fatal MI  . Diabetes Mother   . Stroke Mother   . Diabetes Sister   . Hypothyroidism Sister   . Diabetes Brother   . Hyperlipidemia Brother   . Heart disease Brother    Allergies  Allergen Reactions  . Metoprolol     Lips swelled/ hands swelled and her skin turned red   . Sulfonamide Derivatives    Current Outpatient Prescriptions on File Prior to Visit  Medication Sig Dispense Refill  . Cholecalciferol (VITAMIN D3) 2000 UNITS TABS Take by mouth.      . levothyroxine (SYNTHROID, LEVOTHROID) 125 MCG tablet Take 1 tablet (125 mcg total) by mouth daily. 90 tablet 3  . metFORMIN (GLUCOPHAGE) 500 MG tablet Take 1 tablet (500 mg total) by mouth 2 (two) times daily with a meal. 60 tablet 11  . Multiple Vitamins-Minerals (MULTIVITAMIN,TX-MINERALS) tablet Take 1 tablet by mouth 2 (two) times a week.     . Omega-3 Fatty Acids (OMEGA 3 PO) Take 1 capsule by mouth 2 (two) times a week.     . vitamin C (ASCORBIC ACID) 500 MG tablet Take 500 mg by mouth daily.    . vitamin E 100 UNIT capsule Take 100  Units by mouth daily.      No current facility-administered medications on file prior to visit.    Review of Systems Review of Systems  Constitutional: Negative for fever, appetite change, fatigue and unexpected weight change.  Eyes: Negative for pain and visual disturbance.  Respiratory: Negative for cough and shortness of breath.   Cardiovascular: Negative for cp or palpitations    Gastrointestinal: Negative for nausea, diarrhea and constipation.  Genitourinary: Negative for urgency and frequency.  Skin: Negative for pallor or rash   Neurological: Negative for weakness, light-headedness, numbness and headaches.  Hematological: Negative for  adenopathy. Does not bruise/bleed easily.  Psychiatric/Behavioral: Negative for dysphoric mood. The patient is not nervous/anxious.         Objective:   Physical Exam  Constitutional: She appears well-developed and well-nourished. No distress.  overwt and well app  HENT:  Head: Normocephalic and atraumatic.  Mouth/Throat: Oropharynx is clear and moist.  Eyes: Conjunctivae and EOM are normal. Pupils are equal, round, and reactive to light.  Neck: Normal range of motion. Neck supple. No JVD present. Carotid bruit is not present. No thyromegaly present.  Cardiovascular: Normal rate, regular rhythm, normal heart sounds and intact distal pulses.  Exam reveals no gallop.   Pulmonary/Chest: Effort normal and breath sounds normal. No respiratory distress. She has no wheezes. She has no rales.  No crackles  Abdominal: Soft. Bowel sounds are normal. She exhibits no distension, no abdominal bruit and no mass. There is no tenderness.  Musculoskeletal: She exhibits no edema.  Lymphadenopathy:    She has no cervical adenopathy.  Neurological: She is alert. She has normal reflexes.  Skin: Skin is warm and dry. No rash noted.  Psychiatric: She has a normal mood and affect.          Assessment & Plan:   Problem List Items Addressed This Visit       Cardiovascular and Mediastinum   Essential hypertension - Primary    Will add ace for renal protection (disc poss side eff)  Lisinopril 5 mg daily  BP: 128/78 mmHg  Keep up good lifestyle change F/u 6 mo       Relevant Medications   lisinopril (PRINIVIL,ZESTRIL) 5 MG tablet     Endocrine   Diabetes type 2, controlled ([place])    Now controlled  Lab Results  Component Value Date   HGBA1C 6.1 11/02/2015   This is down from 7.9 with habit change/wt loss and metformin  Added ace low dose for renal protection  Eye exam due in fall       Relevant Medications   lisinopril (PRINIVIL,ZESTRIL) 5 MG tablet     Other   OBESITY NOS    Wt is down 6 lb with bmi now of 29 Commended Will continue to work on it Discussed how this problem influences overall health and the risks it imposes  Reviewed plan for weight loss with lower calorie diet (via better food choices and also portion control or program like weight watchers) and exercise building up to or more than 30 minutes 5 days per week including some aerobic activity         Hyperlipidemia    Disc goals for lipids and reasons to control them Rev labs with pt Rev low sat fat diet in detail Trig still high  HDL slt low- disc imp of exercise LDL at goal        Relevant Medications   lisinopril (PRINIVIL,ZESTRIL) 5 MG tablet

## 2015-11-09 NOTE — Assessment & Plan Note (Signed)
Will add ace for renal protection (disc poss side eff)  Lisinopril 5 mg daily  BP: 128/78 mmHg  Keep up good lifestyle change F/u 6 mo

## 2015-11-09 NOTE — Assessment & Plan Note (Signed)
Wt is down 6 lb with bmi now of 29 Commended Will continue to work on it Discussed how this problem influences overall health and the risks it imposes  Reviewed plan for weight loss with lower calorie diet (via better food choices and also portion control or program like weight watchers) and exercise building up to or more than 30 minutes 5 days per week including some aerobic activity

## 2015-11-09 NOTE — Patient Instructions (Addendum)
Start lisinopril 5 mg once daily to help protect your kidneys from diabetes  If you have any side effects - like dizziness or cough - let me know  Keep up the good work with diet and exercise and weight loss  Follow up in 6 months with labs prior  Try to exercise to raise your good cholesterol (HDL)

## 2015-11-09 NOTE — Assessment & Plan Note (Signed)
Disc goals for lipids and reasons to control them Rev labs with pt Rev low sat fat diet in detail Trig still high  HDL slt low- disc imp of exercise LDL at goal

## 2016-01-15 ENCOUNTER — Other Ambulatory Visit: Payer: Self-pay | Admitting: Family Medicine

## 2016-01-15 DIAGNOSIS — N63 Unspecified lump in unspecified breast: Secondary | ICD-10-CM

## 2016-01-21 ENCOUNTER — Ambulatory Visit
Admission: RE | Admit: 2016-01-21 | Discharge: 2016-01-21 | Disposition: A | Discharge status: Home / Self Care | Payer: PPO | Source: Ambulatory Visit | Attending: Family Medicine | Admitting: Family Medicine

## 2016-01-21 ENCOUNTER — Other Ambulatory Visit: Payer: Self-pay | Admitting: Family Medicine

## 2016-01-21 DIAGNOSIS — R922 Inconclusive mammogram: Secondary | ICD-10-CM | POA: Diagnosis not present

## 2016-01-21 DIAGNOSIS — N63 Unspecified lump in unspecified breast: Secondary | ICD-10-CM

## 2016-01-21 DIAGNOSIS — R2232 Localized swelling, mass and lump, left upper limb: Secondary | ICD-10-CM

## 2016-02-01 ENCOUNTER — Telehealth: Payer: Self-pay | Admitting: Family Medicine

## 2016-02-01 DIAGNOSIS — C50912 Malignant neoplasm of unspecified site of left female breast: Secondary | ICD-10-CM

## 2016-02-01 NOTE — Telephone Encounter (Signed)
Mardene Celeste @ [facility] called stating pt call there to schedule a MRI of the breast.  Pt told patricia she received a letter recommending this.  Needs order put in

## 2016-02-01 NOTE — Telephone Encounter (Signed)
I ordered the MRI and will route to [facility]

## 2016-02-04 ENCOUNTER — Telehealth: Payer: Self-pay | Admitting: Family Medicine

## 2016-02-04 DIAGNOSIS — C50912 Malignant neoplasm of unspecified site of left female breast: Secondary | ICD-10-CM

## 2016-02-04 NOTE — Telephone Encounter (Signed)
Done

## 2016-02-04 NOTE — Telephone Encounter (Signed)
Juliann Pulse at [facility] called and said she needs an order changed.  The order is for MRI Bi-Lateral Contrast w/o contrast. The order needs to be changed to MRI Bi-Lateral with and w/o contrast. They can't make appointment for patient until the order is changed.

## 2016-02-07 DIAGNOSIS — H43813 Vitreous degeneration, bilateral: Secondary | ICD-10-CM | POA: Diagnosis not present

## 2016-02-07 DIAGNOSIS — H0012 Chalazion right lower eyelid: Secondary | ICD-10-CM | POA: Diagnosis not present

## 2016-02-07 DIAGNOSIS — H1851 Endothelial corneal dystrophy: Secondary | ICD-10-CM | POA: Diagnosis not present

## 2016-02-07 DIAGNOSIS — E119 Type 2 diabetes mellitus without complications: Secondary | ICD-10-CM | POA: Diagnosis not present

## 2016-02-07 LAB — HM DIABETES EYE EXAM

## 2016-02-13 ENCOUNTER — Encounter: Payer: Self-pay | Admitting: Family Medicine

## 2016-02-14 ENCOUNTER — Ambulatory Visit
Admission: RE | Admit: 2016-02-14 | Discharge: 2016-02-14 | Disposition: A | Discharge status: Home / Self Care | Payer: PPO | Source: Ambulatory Visit | Attending: Family Medicine | Admitting: Family Medicine

## 2016-02-14 DIAGNOSIS — C50912 Malignant neoplasm of unspecified site of left female breast: Secondary | ICD-10-CM

## 2016-02-14 DIAGNOSIS — Z853 Personal history of malignant neoplasm of breast: Secondary | ICD-10-CM | POA: Diagnosis not present

## 2016-02-14 MED ORDER — GADOBENATE DIMEGLUMINE 529 MG/ML IV SOLN
14.0000 mL | Freq: Once | INTRAVENOUS | Status: AC | PRN
  Administered 2016-02-14: 14 mL via INTRAVENOUS

## 2016-02-27 ENCOUNTER — Other Ambulatory Visit: Payer: Self-pay | Admitting: Family Medicine

## 2016-02-27 ENCOUNTER — Telehealth: Payer: Self-pay | Admitting: Family Medicine

## 2016-02-27 DIAGNOSIS — R922 Inconclusive mammogram: Secondary | ICD-10-CM | POA: Insufficient documentation

## 2016-02-27 NOTE — Telephone Encounter (Signed)
Order # for pt's breast biopsy on desk please put order in and Rosaria Ferries will call pt today

## 2016-02-27 NOTE — Telephone Encounter (Signed)
Order done. Thanks.

## 2016-02-27 NOTE — Telephone Encounter (Signed)
Appt made for 03/03/16 at 2:30pm and patient aware.

## 2016-03-03 ENCOUNTER — Ambulatory Visit
Admission: RE | Admit: 2016-03-03 | Discharge: 2016-03-03 | Disposition: A | Discharge status: Home / Self Care | Payer: PPO | Source: Ambulatory Visit | Attending: Family Medicine | Admitting: Family Medicine

## 2016-03-03 DIAGNOSIS — N6489 Other specified disorders of breast: Secondary | ICD-10-CM | POA: Diagnosis not present

## 2016-03-03 DIAGNOSIS — N641 Fat necrosis of breast: Secondary | ICD-10-CM | POA: Diagnosis not present

## 2016-03-03 DIAGNOSIS — R922 Inconclusive mammogram: Secondary | ICD-10-CM

## 2016-03-25 ENCOUNTER — Other Ambulatory Visit: Payer: Self-pay | Admitting: Family Medicine

## 2016-05-03 ENCOUNTER — Telehealth: Payer: Self-pay | Admitting: Family Medicine

## 2016-05-03 DIAGNOSIS — E119 Type 2 diabetes mellitus without complications: Secondary | ICD-10-CM

## 2016-05-03 DIAGNOSIS — E7800 Pure hypercholesterolemia, unspecified: Secondary | ICD-10-CM

## 2016-05-03 DIAGNOSIS — I10 Essential (primary) hypertension: Secondary | ICD-10-CM

## 2016-05-03 DIAGNOSIS — E039 Hypothyroidism, unspecified: Secondary | ICD-10-CM

## 2016-05-03 NOTE — Telephone Encounter (Signed)
-----   Message from Ellamae Sia sent at 04/29/2016 11:17 AM EST ----- Regarding: Lab orders for Wednesday, 1.3.18 Lab orders for a 6 month follow up appt

## 2016-05-07 ENCOUNTER — Other Ambulatory Visit (INDEPENDENT_AMBULATORY_CARE_PROVIDER_SITE_OTHER): Payer: PPO

## 2016-05-07 DIAGNOSIS — I10 Essential (primary) hypertension: Secondary | ICD-10-CM

## 2016-05-07 DIAGNOSIS — E7800 Pure hypercholesterolemia, unspecified: Secondary | ICD-10-CM | POA: Diagnosis not present

## 2016-05-07 DIAGNOSIS — E039 Hypothyroidism, unspecified: Secondary | ICD-10-CM

## 2016-05-07 DIAGNOSIS — E119 Type 2 diabetes mellitus without complications: Secondary | ICD-10-CM

## 2016-05-07 LAB — COMPREHENSIVE METABOLIC PANEL
ALBUMIN: 4.2 g/dL (ref 3.5–5.2)
ALT: 18 U/L (ref 0–35)
AST: 20 U/L (ref 0–37)
Alkaline Phosphatase: 56 U/L (ref 39–117)
BUN: 14 mg/dL (ref 6–23)
CALCIUM: 9.7 mg/dL (ref 8.4–10.5)
CHLORIDE: 102 meq/L (ref 96–112)
CO2: 30 mEq/L (ref 19–32)
CREATININE: 0.82 mg/dL (ref 0.40–1.20)
GFR: 73.84 mL/min (ref >60.00)
Glucose, Bld: 121 mg/dL — ABNORMAL HIGH (ref 70–99)
POTASSIUM: 4.2 meq/L (ref 3.5–5.1)
Sodium: 139 mEq/L (ref 135–145)
Total Bilirubin: 0.6 mg/dL (ref 0.2–1.2)
Total Protein: 7.3 g/dL (ref 6.0–8.3)

## 2016-05-07 LAB — LIPID PANEL
Cholesterol: 226 mg/dL — ABNORMAL HIGH (ref 0–200)
HDL: 36.3 mg/dL — ABNORMAL LOW (ref >39.00)
Total CHOL/HDL Ratio: 6
Triglycerides: 451 mg/dL — ABNORMAL HIGH (ref 0.0–149.0)

## 2016-05-07 LAB — CBC WITH DIFFERENTIAL/PLATELET
Basophils Absolute: 0 10*3/uL (ref 0.0–0.1)
Basophils Relative: 0.6 % (ref 0.0–3.0)
EOS PCT: 4.2 % (ref 0.0–5.0)
Eosinophils Absolute: 0.3 10*3/uL (ref 0.0–0.7)
HCT: 42.3 % (ref 36.0–46.0)
HEMOGLOBIN: 14.7 g/dL (ref 12.0–15.0)
Lymphocytes Relative: 27.4 % (ref 12.0–46.0)
Lymphs Abs: 2.3 10*3/uL (ref 0.7–4.0)
MCHC: 34.8 g/dL (ref 30.0–36.0)
MCV: 88.9 fl (ref 78.0–100.0)
MONO ABS: 0.5 10*3/uL (ref 0.1–1.0)
Monocytes Relative: 6 % (ref 3.0–12.0)
Neutro Abs: 5.1 10*3/uL (ref 1.4–7.7)
Neutrophils Relative %: 61.8 % (ref 43.0–77.0)
Platelets: 332 10*3/uL (ref 150.0–400.0)
RBC: 4.76 Mil/uL (ref 3.87–5.11)
RDW: 12.8 % (ref 11.5–15.5)
WBC: 8.3 10*3/uL (ref 4.0–10.5)

## 2016-05-07 LAB — TSH: TSH: 1.2 u[IU]/mL (ref 0.35–4.50)

## 2016-05-07 LAB — LDL CHOLESTEROL, DIRECT: Direct LDL: 70 mg/dL

## 2016-05-07 LAB — HEMOGLOBIN A1C: HEMOGLOBIN A1C: 6 % (ref 4.6–6.5)

## 2016-05-12 ENCOUNTER — Ambulatory Visit (INDEPENDENT_AMBULATORY_CARE_PROVIDER_SITE_OTHER): Payer: PPO | Admitting: Family Medicine

## 2016-05-12 ENCOUNTER — Encounter: Payer: Self-pay | Admitting: Family Medicine

## 2016-05-12 VITALS — BP 126/72 | HR 68 | Temp 98.4°F | Ht 66.0 in | Wt 179.0 lb

## 2016-05-12 DIAGNOSIS — E781 Pure hyperglyceridemia: Secondary | ICD-10-CM

## 2016-05-12 DIAGNOSIS — I10 Essential (primary) hypertension: Secondary | ICD-10-CM

## 2016-05-12 DIAGNOSIS — E119 Type 2 diabetes mellitus without complications: Secondary | ICD-10-CM

## 2016-05-12 DIAGNOSIS — B9789 Other viral agents as the cause of diseases classified elsewhere: Secondary | ICD-10-CM

## 2016-05-12 DIAGNOSIS — J069 Acute upper respiratory infection, unspecified: Secondary | ICD-10-CM

## 2016-05-12 DIAGNOSIS — C50912 Malignant neoplasm of unspecified site of left female breast: Secondary | ICD-10-CM

## 2016-05-12 MED ORDER — FENOFIBRATE 54 MG PO TABS: 54.0000 mg | ORAL_TABLET | Freq: Every day | ORAL | 3 refills | Status: DC

## 2016-05-12 MED ORDER — LISINOPRIL 2.5 MG PO TABS: 2.5000 mg | ORAL_TABLET | Freq: Every day | ORAL | 3 refills | Status: DC

## 2016-05-12 MED ORDER — METFORMIN HCL 500 MG PO TABS: 500.0000 mg | ORAL_TABLET | Freq: Two times a day (BID) | ORAL | 3 refills | Status: DC

## 2016-05-12 NOTE — Patient Instructions (Addendum)
Keep working on weight loss and diet and exercise  I think the triglycerides are a genetic problem  Start fenofibrate 54 mg once daily (keep watching diet)  Schedule fasting lab in 6 weeks  Cut lisinopril to 2.5 mg daily  Sugar control is good   Follow up in 6 months with lab prior

## 2016-05-12 NOTE — Assessment & Plan Note (Signed)
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

## 2016-05-12 NOTE — Assessment & Plan Note (Signed)
Mild and re assuring exam Disc symptomatic care - see instructions on AVS  Update if not starting to improve in a week or if worsening

## 2016-05-12 NOTE — Assessment & Plan Note (Signed)
bp in fair control at this time  BP Readings from Last 1 Encounters:  05/12/16 126/72   No changes needed Disc lifstyle change with low sodium diet and exercise  Will cut lisinopril to 2.5 mg -that is what she has been taking

## 2016-05-12 NOTE — Progress Notes (Signed)
Subjective:    Patient ID: Carla Mccoy, female    DOB: Aug 26, 1950, 66 y.o.   MRN: [id number]  HPI Here for f/u of chronic health problems   Has had GI bug and uri since last visit  Is still coughing - took cough medicine left over from Dr Linda Hedges years ago ? What it is  Mucous pill during the day  nyquil occ   Doing fairly well otherwise    Wt Readings from Last 3 Encounters:  05/12/16 179 lb (81.2 kg)  11/09/15 185 lb 4 oz (84 kg)  09/11/15 191 lb 11.2 oz (87 kg)  still loosing weight - really working on it/ happy with that  Walking for exercise and diet is good most of the time  bmi is 28.89  bp is stable today  No cp or palpitations or headaches or edema  No side effects to medicines  BP Readings from Last 3 Encounters:  05/12/16 126/72  11/09/15 128/78  07/30/15 132/80    She only takes 1/2 of lisinopril   Hypothyroidism  Pt has no clinical changes No change in energy level/ hair or skin/ edema and no tremor Lab Results  Component Value Date   TSH 1.20 05/07/2016    No changes needed    Diabetes Home sugar results  DM diet - up and down- mostly good  Exercise walking- every day  Symptoms-none  A1C last  Lab Results  Component Value Date   HGBA1C 6.0 05/07/2016   Down from 6.1  No problems with medications -metformin  Renal protection-ace  Last eye exam  10/17   Hyperlipidemia Lab Results  Component Value Date   CHOL 226 (H) 05/07/2016   CHOL 202 (H) 11/02/2015   CHOL 204 (H) 03/19/2015   Lab Results  Component Value Date   HDL 36.30 (L) 05/07/2016   HDL 38.40 (L) 11/02/2015   HDL 39.00 (L) 03/19/2015   Lab Results  Component Value Date   LDLCALC 125 (H) 03/06/2006   Lab Results  Component Value Date   TRIG (H) 05/07/2016    451.0 Triglyceride is over 400; calculations on Lipids are invalid.   TRIG 330.0 (H) 11/02/2015   TRIG 294.0 (H) 03/19/2015   Lab Results  Component Value Date   CHOLHDL 6 05/07/2016   CHOLHDL 5  11/02/2015   CHOLHDL 5 03/19/2015   Lab Results  Component Value Date   LDLDIRECT 70.0 05/07/2016   LDLDIRECT 85.0 11/02/2015   LDLDIRECT 93.0 03/19/2015   thinks her high triglycerides are hereditary -- ? Of family history  Good diet /exercising   Patient Active Problem List   Diagnosis Date Noted  . Viral URI with cough 05/12/2016  . Breast density 02/27/2016  . Routine general medical examination at a health care facility 03/26/2015  . Colon cancer screening 03/24/2014  . Encounter for Medicare annual wellness exam 03/24/2014  . Diabetes type 2, controlled ([place]) 03/17/2014  . Status post debridement 09/30/2013  . Trigger finger, acquired 08/26/2013  . Benign paroxysmal positional vertigo 08/26/2013  . OSA (obstructive sleep apnea) 08/10/2013  . Chronic mastitis of left breast 07/04/2013  . Breast abscess s/p I&D 09/14/2013 03/28/2013  . Left breast abscess 03/23/2013  . Breast cancer ([place]) 02/10/2012  . Routine health maintenance 09/06/2011  . LEG CRAMPS 04/12/2009  . 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 BACK PAIN, CHRONIC 05/10/2007  . PEPTIC ULCER DISEASE, HX OF 05/10/2007  . HYSTERECTOMY, HX OF 05/10/2007  . TONSILLECTOMY, HX OF 05/10/2007  .  Hypothyroidism 01/28/2007  . Hypertriglyceridemia 01/28/2007  . OBESITY NOS 01/28/2007  . COMMON MIGRAINE 01/28/2007  . Essential hypertension 01/28/2007  . FATTY LIVER DISEASE 01/28/2007  . ABNORMAL GLUCOSE NEC 01/28/2007  . ANGIOEDEMA 01/28/2007  . CARPAL TUNNEL SYNDROME, HX OF 01/27/2007   Past Medical History:  Diagnosis Date  . 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 Backache, unspecified   . breast ca dx'd 06/2009   breast cancer left - lumpectomy  . Diabetes mellitus without complication ([place])   . Full dentures   . Migraine without aura, without mention of intractable migraine without mention of status migrainosus   . Obesity, unspecified   . Other abnormal glucose   . Other and unspecified  hyperlipidemia   . Other chronic nonalcoholic liver disease   . Personal history of peptic ulcer disease   . 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 Tachy-brady syndrome ([place])   . Unspecified essential hypertension 09/12/13   remote history; no current treatment  . Unspecified hypothyroidism   . Wears glasses    Past Surgical History:  Procedure Laterality Date  . ABDOMINAL HYSTERECTOMY  1988  . BREAST BIOPSY Left    in the 80's - benign tissue  . BREAST LUMPECTOMY Left    Feb '11  . INCISION AND DRAINAGE OF WOUND Left 09/14/2013   Procedure: IRRIGATION AND DEBRIDEMENT LEFT BREAST ABSCESS;  Surgeon: Marcello Moores A. Cornett, MD;  Location: [facility];  Service: General;  Laterality: Left;  . INCISION AND DRAINAGE OF WOUND Left 12/21/2013   Procedure: DEBRIDEMENT OF LEFT BREAST WOUND WITH PLACEMENT OF A CELL;  Surgeon: Theodoro Kos, DO;  Location: [facility];  Service: Plastics;  Laterality: Left;  . LAPAROSCOPY ABDOMEN DIAGNOSTIC    . TONSILLECTOMY    . TUBAL LIGATION  1971   Social History  Substance Use Topics  . Smoking status: Never Smoker  . Smokeless tobacco: Never Used  . Alcohol use No   Family History  Problem Relation Age of Onset  . Peripheral vascular disease Mother   . Coronary artery disease Mother   . Hyperlipidemia Mother   . Heart disease Mother     CAD/fatal MI  . Diabetes Mother   . Stroke Mother   . Diabetes Sister   . Hypothyroidism Sister   . Diabetes Brother   . Hyperlipidemia Brother   . Heart disease Brother    Allergies  Allergen Reactions  . Metoprolol     Lips swelled/ hands swelled and her skin turned red   . Sulfonamide Derivatives    Current Outpatient Prescriptions on File Prior to Visit  Medication Sig Dispense Refill  . Cholecalciferol (VITAMIN D3) 2000 UNITS TABS Take by mouth.      . levothyroxine (SYNTHROID, LEVOTHROID) 125 MCG tablet TAKE ONE TABLET BY MOUTH ONCE  DAILY (Patient taking differently: TAKE ONE HALF (1/2) TABLET BY MOUTH ONCE DAILY) 90 tablet 0  . Multiple Vitamins-Minerals (MULTIVITAMIN,TX-MINERALS) tablet Take 1 tablet by mouth 2 (two) times a week.     . Omega-3 Fatty Acids (OMEGA 3 PO) Take 1 capsule by mouth 2 (two) times a week.     . vitamin C (ASCORBIC ACID) 500 MG tablet Take 500 mg by mouth daily.    . vitamin E 100 UNIT capsule Take 100 Units by mouth daily.      No current facility-administered medications on file prior to visit.  Review of Systems  Constitutional: Positive for appetite change and fatigue. Negative for fever.  HENT: Positive for congestion, postnasal drip, rhinorrhea, sinus pressure, sneezing and sore throat. Negative for ear pain.   Eyes: Negative for pain and discharge.  Respiratory: Positive for cough. Negative for shortness of breath, wheezing and stridor.   Cardiovascular: Negative for chest pain.  Gastrointestinal: Negative for diarrhea, nausea and vomiting.  Genitourinary: Negative for frequency, hematuria and urgency.  Musculoskeletal: Negative for arthralgias and myalgias.  Skin: Negative for rash.  Neurological: Positive for headaches. Negative for dizziness, weakness and light-headedness.  Psychiatric/Behavioral: Negative for confusion and dysphoric mood.       Objective:   Physical Exam  Constitutional: She appears well-developed and well-nourished. No distress.  overwt and well appearing   HENT:  Head: Normocephalic and atraumatic.  Right Ear: External ear normal.  Left Ear: External ear normal.  Mouth/Throat: Oropharynx is clear and moist.  Nares are injected and congested  No sinus tenderness Throat is clear Clear pnd  Eyes: Conjunctivae and EOM are normal. Pupils are equal, round, and reactive to light.  Neck: Normal range of motion. Neck supple. No JVD present. Carotid bruit is not present. No thyromegaly present.  Cardiovascular: Normal rate, regular rhythm, normal heart  sounds and intact distal pulses.  Exam reveals no gallop.   Pulmonary/Chest: Effort normal and breath sounds normal. No respiratory distress. She has no wheezes. She has no rales.  No crackles Good air exch  Abdominal: Soft. Bowel sounds are normal. She exhibits no distension, no abdominal bruit and no mass. There is no tenderness.  Musculoskeletal: She exhibits no edema or tenderness.  Lymphadenopathy:    She has no cervical adenopathy.  Neurological: She is alert. She has normal reflexes. No cranial nerve deficit. She exhibits normal muscle tone. Coordination normal.  Skin: Skin is warm and dry. No rash noted. No pallor.  Psychiatric: She has a normal mood and affect.          Assessment & Plan:   Problem List Items Addressed This Visit      Cardiovascular and Mediastinum   Essential hypertension - Primary    bp in fair control at this time  BP Readings from Last 1 Encounters:  05/12/16 126/72   No changes needed Disc lifstyle change with low sodium diet and exercise  Will cut lisinopril to 2.5 mg -that is what she has been taking      Relevant Medications   lisinopril (PRINIVIL,ZESTRIL) 2.5 MG tablet   fenofibrate 54 MG tablet     Respiratory   Viral URI with cough    Mild and re assuring exam Disc symptomatic care - see instructions on AVS  Update if not starting to improve in a week or if worsening          Endocrine   Diabetes type 2, controlled ([place])    Lab Results  Component Value Date   HGBA1C 6.0 05/07/2016   Very well controlled with metformin and diet/exercise/wt loss Enc to keep up the good work F/u 6 mo       Relevant Medications   lisinopril (PRINIVIL,ZESTRIL) 2.5 MG tablet   metFORMIN (GLUCOPHAGE) 500 MG tablet     Other   Hypertriglyceridemia    Triglycerides are steadily rising despite good habits 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

## 2016-05-12 NOTE — Progress Notes (Signed)
Pre visit review using our clinic review tool, if applicable. No additional management support is needed unless otherwise documented below in the visit note. 

## 2016-05-12 NOTE — Assessment & Plan Note (Signed)
Lab Results  Component Value Date   HGBA1C 6.0 05/07/2016   Very well controlled with metformin and diet/exercise/wt loss Enc to keep up the good work F/u 6 mo

## 2016-05-12 NOTE — Assessment & Plan Note (Signed)
Doing well  Continues screening  No new developments

## 2016-05-27 ENCOUNTER — Encounter: Payer: Self-pay | Admitting: Gastroenterology

## 2016-06-21 ENCOUNTER — Other Ambulatory Visit: Payer: Self-pay | Admitting: Family Medicine

## 2016-06-21 DIAGNOSIS — E781 Pure hyperglyceridemia: Secondary | ICD-10-CM

## 2016-06-22 NOTE — Telephone Encounter (Signed)
-----   Message from Ellamae Sia sent at 06/17/2016  5:07 PM EST ----- Regarding: Lab orders for Wednesday, 2.21.18 Lab orders for a 6 week follow up appt

## 2016-06-25 ENCOUNTER — Other Ambulatory Visit: Payer: PPO

## 2016-06-26 ENCOUNTER — Ambulatory Visit (INDEPENDENT_AMBULATORY_CARE_PROVIDER_SITE_OTHER): Payer: PPO

## 2016-06-26 VITALS — BP 120/80 | HR 78 | Temp 97.9°F | Ht 66.0 in | Wt 179.5 lb

## 2016-06-26 DIAGNOSIS — E781 Pure hyperglyceridemia: Secondary | ICD-10-CM

## 2016-06-26 DIAGNOSIS — Z1159 Encounter for screening for other viral diseases: Secondary | ICD-10-CM

## 2016-06-26 DIAGNOSIS — Z0000 Encounter for general adult medical examination without abnormal findings: Secondary | ICD-10-CM | POA: Diagnosis not present

## 2016-06-26 LAB — LIPID PANEL
CHOL/HDL RATIO: 4
Cholesterol: 181 mg/dL (ref 0–200)
HDL: 45.6 mg/dL (ref >39.00)
LDL CALC: 99 mg/dL (ref 0–99)
NONHDL: 135.55
Triglycerides: 182 mg/dL — ABNORMAL HIGH (ref 0.0–149.0)
VLDL: 36.4 mg/dL (ref 0.0–40.0)

## 2016-06-26 LAB — ALT: ALT: 16 U/L (ref 0–35)

## 2016-06-26 LAB — AST: AST: 20 U/L (ref 0–37)

## 2016-06-26 NOTE — Progress Notes (Signed)
Subjective:   Carla Mccoy is a 66 y.o. female who presents for Medicare Annual (Subsequent) preventive examination.  Review of Systems:  N/A Cardiac Risk Factors include: advanced age (>59men, >83 women);diabetes mellitus;dyslipidemia;hypertension     Objective:     Vitals: BP 120/80 (BP Location: Right Arm, Patient Position: Sitting, Cuff Size: Normal)   Pulse 78   Temp 97.9 F (36.6 C) (Oral)   Ht 5\' 6"  (1.676 m) Comment: no shoes  Wt 179 lb 8 oz (81.4 kg)   SpO2 98%   BMI 28.97 kg/m   Body mass index is 28.97 kg/m.   Tobacco History  Smoking Status  . Never Smoker  Smokeless Tobacco  . Never Used     Counseling given: No   Past Medical History:  Diagnosis Date  . 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 Backache, unspecified   . breast ca dx'd 06/2009   breast cancer left - lumpectomy  . Diabetes mellitus without complication ([place])   . Full dentures   . Migraine without aura, without mention of intractable migraine without mention of status migrainosus   . Obesity, unspecified   . Other abnormal glucose   . Other and unspecified hyperlipidemia   . Other chronic nonalcoholic liver disease   . Personal history of peptic ulcer disease   . 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 Tachy-brady syndrome ([place])   . Unspecified essential hypertension 09/12/13   remote history; no current treatment  . Unspecified hypothyroidism   . Wears glasses    Past Surgical History:  Procedure Laterality Date  . ABDOMINAL HYSTERECTOMY  1988  . BREAST BIOPSY Left    in the 80's - benign tissue  . BREAST LUMPECTOMY Left    Feb '11  . INCISION AND DRAINAGE OF WOUND Left 09/14/2013   Procedure: IRRIGATION AND DEBRIDEMENT LEFT BREAST ABSCESS;  Surgeon: Marcello Moores A. Cornett, MD;  Location: [facility];  Service: General;  Laterality: Left;  . INCISION AND DRAINAGE OF WOUND Left 12/21/2013   Procedure:  DEBRIDEMENT OF LEFT BREAST WOUND WITH PLACEMENT OF A CELL;  Surgeon: Theodoro Kos, DO;  Location: [facility];  Service: Plastics;  Laterality: Left;  . LAPAROSCOPY ABDOMEN DIAGNOSTIC    . TONSILLECTOMY    . TUBAL LIGATION  1971   Family History  Problem Relation Age of Onset  . Peripheral vascular disease Mother   . Coronary artery disease Mother   . Hyperlipidemia Mother   . Heart disease Mother     CAD/fatal MI  . Diabetes Mother   . Stroke Mother   . Diabetes Sister   . Hypothyroidism Sister   . Diabetes Brother   . Hyperlipidemia Brother   . Heart disease Brother    History  Sexual Activity  . Sexual activity: Yes  . Partners: Male    Outpatient Encounter Prescriptions as of 06/26/2016  Medication Sig  . Cholecalciferol (VITAMIN D3) 2000 UNITS TABS Take by mouth.    . fenofibrate 54 MG tablet Take 1 tablet (54 mg total) by mouth daily.  [name] levothyroxine (SYNTHROID, LEVOTHROID) 125 MCG tablet TAKE ONE TABLET BY MOUTH ONCE DAILY  . lisinopril (PRINIVIL,ZESTRIL) 2.5 MG tablet Take 1 tablet (2.5 mg total) by mouth daily.  . metFORMIN (GLUCOPHAGE) 500 MG tablet Take 1 tablet (500 mg total) by mouth 2 (two) times daily with a meal.  . Multiple Vitamins-Minerals (MULTIVITAMIN,TX-MINERALS) tablet Take 1  tablet by mouth 2 (two) times a week.   . Omega-3 Fatty Acids (OMEGA 3 PO) Take 1 capsule by mouth 2 (two) times a week.   . vitamin C (ASCORBIC ACID) 500 MG tablet Take 500 mg by mouth daily.  . vitamin E 100 UNIT capsule Take 100 Units by mouth daily.    No facility-administered encounter medications on file as of 06/26/2016.     Activities of Daily Living In your present state of health, do you have any difficulty performing the following activities: 06/26/2016  Hearing? N  Vision? N  Difficulty concentrating or making decisions? N  Walking or climbing stairs? N  Dressing or bathing? N  Doing errands, shopping? N  Preparing Food and eating ? N  Using the  Toilet? N  In the past six months, have you accidently leaked urine? N  Do you have problems with loss of bowel control? N  Managing your Medications? N  Managing your Finances? N  Housekeeping or managing your Housekeeping? N  Some recent data might be hidden    Patient Care Team: Abner Greenspan, MD as PCP - General (Family Medicine) Peri Maris, MD (Obstetrics and Gynecology) Renella Cunas, MD (Inactive) (Cardiology) Lennon Alstrom, MD (Neurology) Sable Feil, MD (Gastroenterology) Chauncey Cruel, MD (Hematology and Oncology) Erroll Luna, MD (General Surgery) Sharol Roussel, OD (Optometry) Clent Jacks, MD (Ophthalmology)    Assessment:     Hearing Screening   125Hz  250Hz  500Hz  1000Hz  2000Hz  3000Hz  4000Hz  6000Hz  8000Hz   Right ear:   40 0 40  40    Left ear:   40 0 40  0    Vision Screening Comments: Last vision exam in Oct 2017 with Dr. Cena Benton   Exercise Activities and Dietary recommendations Current Exercise Habits: Home exercise routine, Type of exercise: walking;stretching;calisthenics, Time (Minutes): 30, Frequency (Times/Week): 6, Weekly Exercise (Minutes/Week): 180, Intensity: Moderate, Exercise limited by: None identified  Goals    . Increase physical activity          Starting 06/26/2016, I will continue to exercise for 30 min 5-6 days per week.       Fall Risk Fall Risk  06/26/2016 10/02/2015 09/18/2015 09/11/2015 03/26/2015  Falls in the past year? No No No No No   Depression Screen PHQ 2/9 Scores 06/26/2016 10/02/2015 09/18/2015 09/11/2015  PHQ - 2 Score 0 0 0 0     Cognitive Function MMSE - Mini Mental State Exam 06/26/2016  Orientation to time 5  Orientation to Place 5  Registration 3  Attention/ Calculation 0  Recall 3  Language- name 2 objects 0  Language- repeat 1  Language- follow 3 step command 3  Language- read & follow direction 0  Write a sentence 0  Copy design 0  Total score 20       PLEASE NOTE: A Mini-Cog screen was completed.  Maximum score is 20. A value of 0 denotes this part of [place] MMSE was not completed or the patient failed this part of the Mini-Cog screening.   Mini-Cog Screening Orientation to Time - Max 5 pts Orientation to Place - Max 5 pts Registration - Max 3 pts Recall - Max 3 pts Language Repeat - Max 1 pts Language Follow 3 Step Command - Max 3 pts   Immunization History  Administered Date(s) Administered  . H1N1 03/06/2008  . Influenza Whole 03/06/2008  . Influenza, High Dose Seasonal PF 03/28/2016  . Influenza,inj,Quad PF,36+ Mos 03/24/2014, 03/26/2015  . Influenza-Unspecified  02/02/2013  . Pneumococcal Conjugate-13 03/22/2013  . Pneumococcal Polysaccharide-23 09/02/2011  . Td 08/07/2008   Screening Tests Health Maintenance  Topic Date Due  . COLONOSCOPY  07/12/2016  . PNA vac Low Risk Adult (2 of 2 - PPSV23) 09/01/2016  . HEMOGLOBIN A1C  11/04/2016  . FOOT EXAM  11/08/2016  . OPHTHALMOLOGY EXAM  02/06/2017  . MAMMOGRAM  01/20/2018  . TETANUS/TDAP  08/08/2018  . INFLUENZA VACCINE  Completed  . DEXA SCAN  Completed  . Hepatitis C Screening  Completed      Plan:     I have personally reviewed and addressed the Medicare Annual Wellness questionnaire and have noted the following in the patient's chart:  A. Medical and social history B. Use of alcohol, tobacco or illicit drugs  C. Current medications and supplements D. Functional ability and status E.  Nutritional status F.  Physical activity G. Advance directives H. List of other physicians I.  Hospitalizations, surgeries, and ER visits in previous 12 months J.  [place] to include hearing, vision, cognitive, depression L. Referrals and appointments - none  In addition, I have reviewed and discussed with patient certain preventive protocols, quality metrics, and best practice recommendations. A written personalized care plan for preventive services as well as general preventive health recommendations were  provided to patient.  See attached scanned questionnaire for additional information.   Signed,   Lindell Noe, MHA, BS, LPN Health Coach

## 2016-06-26 NOTE — Progress Notes (Signed)
Pre visit review using our clinic review tool, if applicable. No additional management support is needed unless otherwise documented below in the visit note. 

## 2016-06-26 NOTE — Progress Notes (Signed)
PCP notes:   Health maintenance:  Hep C screening - completed  Abnormal screenings:   Hearing - failed  Patient concerns:   None  Nurse concerns:  None  Next PCP appt:   11/14/16 @ 0800

## 2016-06-26 NOTE — Progress Notes (Signed)
I reviewed health advisor's note, was available for consultation, and agree with documentation and plan.  

## 2016-06-26 NOTE — Patient Instructions (Signed)
Ms. Mayhall , Thank you for taking time to come for your Medicare Wellness Visit. I appreciate your ongoing commitment to your health goals. Please review the following plan we discussed and let me know if I can assist you in the future.   These are the goals we discussed: Goals    . Increase physical activity          Starting 06/26/2016, I will continue to exercise for 30 min 5-6 days per week.        This is a list of the screening recommended for you and due dates:  Health Maintenance  Topic Date Due  . Colon Cancer Screening  07/12/2016  . Pneumonia vaccines (2 of 2 - PPSV23) 09/01/2016  . Hemoglobin A1C  11/04/2016  . Complete foot exam   11/08/2016  . Eye exam for diabetics  02/06/2017  . Mammogram  01/20/2018  . Tetanus Vaccine  08/08/2018  . Flu Shot  Completed  . DEXA scan (bone density measurement)  Completed  .  Hepatitis C: One time screening is recommended by Center for Disease Control  (CDC) for  adults born from [birth year] through 1967.   Completed   Preventive Care for Adults  A healthy lifestyle and preventive care can promote health and wellness. Preventive health guidelines for adults include the following key practices.  . A routine yearly physical is a good way to check with your health care provider about your health and preventive screening. It is a chance to share any concerns and updates on your health and to receive a thorough exam.  . Visit your dentist for a routine exam and preventive care every 6 months. Brush your teeth twice a day and floss once a day. Good oral hygiene prevents tooth decay and gum disease.  . The frequency of eye exams is based on your age, health, family medical history, use  of contact lenses, and other factors. Follow your health care provider's ecommendations for frequency of eye exams.  . Eat a healthy diet. Foods like vegetables, fruits, whole grains, low-fat dairy products, and lean protein foods contain the nutrients you need  without too many calories. Decrease your intake of foods high in solid fats, added sugars, and salt. Eat the right amount of calories for you. Get information about a proper diet from your health care provider, if necessary.  . Regular physical exercise is one of the most important things you can do for your health. Most adults should get at least 150 minutes of moderate-intensity exercise (any activity that increases your heart rate and causes you to sweat) each week. In addition, most adults need muscle-strengthening exercises on 2 or more days a week.  Silver Sneakers may be a benefit available to you. To determine eligibility, you may visit the website: www.silversneakers.com or contact program at 740 022 6538 Mon-Fri between 8AM-8PM.   . Maintain a healthy weight. The body mass index (BMI) is a screening tool to identify possible weight problems. It provides an estimate of body fat based on height and weight. Your health care provider can find your BMI and can help you achieve or maintain a healthy weight.   For adults 20 years and older: ? A BMI below 18.5 is considered underweight. ? A BMI of 18.5 to 24.9 is normal. ? A BMI of 25 to 29.9 is considered overweight. ? A BMI of 30 and above is considered obese.   . Maintain normal blood lipids and cholesterol levels by exercising and minimizing your  intake of saturated fat. Eat a balanced diet with plenty of fruit and vegetables. Blood tests for lipids and cholesterol should begin at age 74 and be repeated every 5 years. If your lipid or cholesterol levels are high, you are over 50, or you are at high risk for heart disease, you may need your cholesterol levels checked more frequently. Ongoing high lipid and cholesterol levels should be treated with medicines if diet and exercise are not working.  . If you smoke, find out from your health care provider how to quit. If you do not use tobacco, please do not start.  . If you choose to drink  alcohol, please do not consume more than 2 drinks per day. One drink is considered to be 12 ounces (355 mL) of beer, 5 ounces (148 mL) of wine, or 1.5 ounces (44 mL) of liquor.  . If you are 35-85 years old, ask your health care provider if you should take aspirin to prevent strokes.  . Use sunscreen. Apply sunscreen liberally and repeatedly throughout the day. You should seek shade when your shadow is shorter than you. Protect yourself by wearing long sleeves, pants, a wide-brimmed hat, and sunglasses year round, whenever you are outdoors.  . Once a month, do a whole body skin exam, using a mirror to look at the skin on your back. Tell your health care provider of new moles, moles that have irregular borders, moles that are larger than a pencil eraser, or moles that have changed in shape or color.

## 2016-06-27 LAB — HEPATITIS C ANTIBODY: HCV Ab: NEGATIVE

## 2016-09-18 ENCOUNTER — Other Ambulatory Visit: Payer: Self-pay | Admitting: Family Medicine

## 2016-11-09 ENCOUNTER — Telehealth: Payer: Self-pay | Admitting: Family Medicine

## 2016-11-09 DIAGNOSIS — Z0000 Encounter for general adult medical examination without abnormal findings: Secondary | ICD-10-CM

## 2016-11-09 DIAGNOSIS — E119 Type 2 diabetes mellitus without complications: Secondary | ICD-10-CM

## 2016-11-09 NOTE — Telephone Encounter (Signed)
-----   Message from Marchia Bond sent at 11/07/2016  9:35 AM EDT ----- Regarding: F/u labs Wed 7/11, need orders. Thanks ! :-) Please order  future f/u labs for pt's upcoming lab appt. Thanks Carla Mccoy

## 2016-11-12 ENCOUNTER — Other Ambulatory Visit (INDEPENDENT_AMBULATORY_CARE_PROVIDER_SITE_OTHER): Payer: PPO

## 2016-11-12 DIAGNOSIS — E119 Type 2 diabetes mellitus without complications: Secondary | ICD-10-CM

## 2016-11-12 DIAGNOSIS — Z0000 Encounter for general adult medical examination without abnormal findings: Secondary | ICD-10-CM

## 2016-11-12 LAB — CBC WITH DIFFERENTIAL/PLATELET
Basophils Absolute: 0.1 10*3/uL (ref 0.0–0.1)
Basophils Relative: 1.1 % (ref 0.0–3.0)
Eosinophils Absolute: 0.3 10*3/uL (ref 0.0–0.7)
Eosinophils Relative: 4.5 % (ref 0.0–5.0)
HCT: 40.7 % (ref 36.0–46.0)
Hemoglobin: 13.9 g/dL (ref 12.0–15.0)
Lymphocytes Relative: 28.9 % (ref 12.0–46.0)
Lymphs Abs: 2.2 10*3/uL (ref 0.7–4.0)
MCHC: 34 g/dL (ref 30.0–36.0)
MCV: 90.3 fl (ref 78.0–100.0)
Monocytes Absolute: 0.5 10*3/uL (ref 0.1–1.0)
Monocytes Relative: 6.2 % (ref 3.0–12.0)
Neutro Abs: 4.5 10*3/uL (ref 1.4–7.7)
Neutrophils Relative %: 59.3 % (ref 43.0–77.0)
Platelets: 342 10*3/uL (ref 150.0–400.0)
RBC: 4.51 Mil/uL (ref 3.87–5.11)
RDW: 13.1 % (ref 11.5–15.5)
WBC: 7.7 10*3/uL (ref 4.0–10.5)

## 2016-11-12 LAB — COMPREHENSIVE METABOLIC PANEL WITH GFR
ALT: 17 U/L (ref 0–35)
AST: 19 U/L (ref 0–37)
Albumin: 4.1 g/dL (ref 3.5–5.2)
Alkaline Phosphatase: 32 U/L — ABNORMAL LOW (ref 39–117)
BUN: 12 mg/dL (ref 6–23)
CO2: 27 meq/L (ref 19–32)
Calcium: 9.9 mg/dL (ref 8.4–10.5)
Chloride: 106 meq/L (ref 96–112)
Creatinine, Ser: 0.92 mg/dL (ref 0.40–1.20)
GFR: 64.56 mL/min
Glucose, Bld: 139 mg/dL — ABNORMAL HIGH (ref 70–99)
Potassium: 4.6 meq/L (ref 3.5–5.1)
Sodium: 140 meq/L (ref 135–145)
Total Bilirubin: 0.5 mg/dL (ref 0.2–1.2)
Total Protein: 7.2 g/dL (ref 6.0–8.3)

## 2016-11-12 LAB — LIPID PANEL
CHOL/HDL RATIO: 4
CHOLESTEROL: 164 mg/dL (ref 0–200)
HDL: 43.9 mg/dL (ref >39.00)
LDL CALC: 81 mg/dL (ref 0–99)
NonHDL: 120.51
Triglycerides: 200 mg/dL — ABNORMAL HIGH (ref 0.0–149.0)
VLDL: 40 mg/dL (ref 0.0–40.0)

## 2016-11-12 LAB — TSH: TSH: 2.8 u[IU]/mL (ref 0.35–4.50)

## 2016-11-12 LAB — HEMOGLOBIN A1C: Hgb A1c MFr Bld: 6.6 % — ABNORMAL HIGH (ref 4.6–6.5)

## 2016-11-14 ENCOUNTER — Ambulatory Visit: Payer: PPO | Admitting: Family Medicine

## 2016-11-18 ENCOUNTER — Encounter: Payer: Self-pay | Admitting: Family Medicine

## 2016-11-18 ENCOUNTER — Ambulatory Visit (INDEPENDENT_AMBULATORY_CARE_PROVIDER_SITE_OTHER): Payer: PPO | Admitting: Family Medicine

## 2016-11-18 VITALS — BP 118/70 | HR 72 | Temp 97.9°F | Ht 66.0 in | Wt 185.0 lb

## 2016-11-18 DIAGNOSIS — E119 Type 2 diabetes mellitus without complications: Secondary | ICD-10-CM

## 2016-11-18 DIAGNOSIS — M79601 Pain in right arm: Secondary | ICD-10-CM | POA: Diagnosis not present

## 2016-11-18 DIAGNOSIS — R058 Other specified cough: Secondary | ICD-10-CM

## 2016-11-18 DIAGNOSIS — M6530 Trigger finger, unspecified finger: Secondary | ICD-10-CM

## 2016-11-18 DIAGNOSIS — R252 Cramp and spasm: Secondary | ICD-10-CM | POA: Diagnosis not present

## 2016-11-18 DIAGNOSIS — R05 Cough: Secondary | ICD-10-CM | POA: Diagnosis not present

## 2016-11-18 DIAGNOSIS — IMO0001 Reserved for inherently not codable concepts without codable children: Secondary | ICD-10-CM

## 2016-11-18 DIAGNOSIS — T464X5A Adverse effect of angiotensin-converting-enzyme inhibitors, initial encounter: Secondary | ICD-10-CM

## 2016-11-18 DIAGNOSIS — E781 Pure hyperglyceridemia: Secondary | ICD-10-CM

## 2016-11-18 DIAGNOSIS — G8929 Other chronic pain: Secondary | ICD-10-CM | POA: Diagnosis not present

## 2016-11-18 DIAGNOSIS — I10 Essential (primary) hypertension: Secondary | ICD-10-CM | POA: Diagnosis not present

## 2016-11-18 DIAGNOSIS — Z8279 Family history of other congenital malformations, deformations and chromosomal abnormalities: Secondary | ICD-10-CM | POA: Diagnosis not present

## 2016-11-18 DIAGNOSIS — M79603 Pain in arm, unspecified: Secondary | ICD-10-CM

## 2016-11-18 DIAGNOSIS — E1165 Type 2 diabetes mellitus with hyperglycemia: Secondary | ICD-10-CM | POA: Diagnosis not present

## 2016-11-18 DIAGNOSIS — E039 Hypothyroidism, unspecified: Secondary | ICD-10-CM

## 2016-11-18 MED ORDER — LOSARTAN POTASSIUM 25 MG PO TABS: 25.0000 mg | ORAL_TABLET | Freq: Every day | ORAL | 3 refills | Status: DC

## 2016-11-18 NOTE — Patient Instructions (Addendum)
Think about some beginning yoga (there is a video called yoga for dummies that is good) - just start with a few simple poses  Try to take 250-500 mg of magnesium daily   Hold your fenofibrate for 2 weeks to see if cramps improve - If they do not improve re start it  Let us know how it goes   For cough - stop lisinopril and start losartan  You cough should stop - if not let me know    Find out what muscle disease your aunt had (thanks)   We need to check an echocardiogram in light of your brother's congenital cardiomyopathy  We will get that scheduled   Get back on track with a diabetic diet and exercise   We will refer you to an orthopedic specialist for your arm problem    Follow up in 3 months

## 2016-11-18 NOTE — Assessment & Plan Note (Signed)
Lab Results  Component Value Date   HGBA1C 6.6 (H) 11/12/2016   This is up a bit after vacation eating  She plans to get back on track now with dm diet  Exercise as tolerated F/u 3 mo

## 2016-11-18 NOTE — Assessment & Plan Note (Signed)
bp in fair control at this time  BP Readings from Last 1 Encounters:  11/18/16 118/70   She appears to be developing ace cough-so will change to 25 mg of losartan  Update if problems or side eff Disc lifstyle change with low sodium diet and exercise

## 2016-11-18 NOTE — Assessment & Plan Note (Signed)
Disc goals for lipids and reasons to control them Rev labs with pt Rev low sat fat diet in detail Improved with fenofibrate   Due to leg cramps however will hold it 2 weeks to see if it contributes (doubtful)

## 2016-11-18 NOTE — Assessment & Plan Note (Signed)
Going on for years  Nl cmet  Recommend trial of magnesium  Also stretching or yoga Tonic water if it helps  Update if no improvement  ? Aunt has a muscular condition-she will find out what  Also hold fenofibrate 2 wk to see if that makes any difference and let me know

## 2016-11-18 NOTE — Progress Notes (Signed)
Subjective:    Patient ID: Carla Mccoy, female    DOB: 12/14/1950, 66 y.o.   MRN: [id number]  HPI  Here for f/u of chronic medical problems   Wt Readings from Last 3 Encounters:  11/18/16 185 lb (83.9 kg)  06/26/16 179 lb 8 oz (81.4 kg)  05/12/16 179 lb (81.2 kg)   29.86 kg/m   Really bad leg cramps at night (since 2005)  ? If from the fenofibrate  Has had them over the years  Always at night / occ in day - 20 min / severe/ take her "to the floor" Legs mostly  occ in feet  Not in hands or UEs   Dry cough for months -- ace?   She needs ref to [facility] orth[facility] specialist for R chronic arm pain   Worse last week  She tries tonic water  Has not tried magnesium  Less days of exercise because she works in the yard more  Formal exercise in the winter- 30 minutes of [employer] and stretches and walking  Has never tried yoga  Thinks she has lost muscle in legs  Aunt had a muscular condition (? What it was)   Found out that her brother had hypertrophic myopathy - congenital       Chemistry      Component Value Date/Time   NA 140 11/12/2016 0933   NA 139 08/05/2012 0824   K 4.6 11/12/2016 0933   K 4.3 08/05/2012 0824   CL 106 11/12/2016 0933   CL 106 08/05/2012 0824   CO2 27 11/12/2016 0933   CO2 24 08/05/2012 0824   BUN 12 11/12/2016 0933   BUN 13.2 08/05/2012 0824   CREATININE 0.92 11/12/2016 0933   CREATININE 1.0 08/05/2012 0824      Component Value Date/Time   CALCIUM 9.9 11/12/2016 0933   CALCIUM 8.9 08/05/2012 0824   ALKPHOS 32 (L) 11/12/2016 0933   ALKPHOS 55 08/05/2012 0824   AST 19 11/12/2016 0933   AST 35 (H) 08/05/2012 0824   ALT 17 11/12/2016 0933   ALT 21 08/05/2012 0824   BILITOT 0.5 11/12/2016 0933   BILITOT 0.54 08/05/2012 0824        Wt Readings from Last 3 Encounters:  11/18/16 185 lb (83.9 kg)  06/26/16 179 lb 8 oz (81.4 kg)  05/12/16 179 lb (81.2 kg)  she has put on some weight - 6lb  29.86 kg/m  bp is stable today  No cp or  palpitations or headaches or edema  No side effects to medicines  BP Readings from Last 3 Encounters:  11/18/16 118/70  06/26/16 120/80  05/12/16 126/72     Hypothyroidism  Pt has no clinical changes No change in energy level/ hair or skin/ edema and no tremor Lab Results  Component Value Date   TSH 2.80 11/12/2016     DM2 controlled Lab Results  Component Value Date   HGBA1C 6.6 (H) 11/12/2016   This is up from 6.0 Eye exam 10/17 Ace for renal protection  On metformin  Eating was the problem - she "had been bad" - was drinking a soda 3 times per week and not paying attention  Knows what to do   Hypertriglyceridemia Lab Results  Component Value Date   CHOL 164 11/12/2016   CHOL 181 06/26/2016   CHOL 226 (H) 05/07/2016   Lab Results  Component Value Date   HDL 43.90 11/12/2016   HDL 45.60 06/26/2016   HDL 36.30 (L)  05/07/2016   Lab Results  Component Value Date   LDLCALC 81 11/12/2016   LDLCALC 99 06/26/2016   LDLCALC 125 (H) 03/06/2006   Lab Results  Component Value Date   TRIG 200.0 (H) 11/12/2016   TRIG 182.0 (H) 06/26/2016   TRIG (H) 05/07/2016    451.0 Triglyceride is over 400; calculations on Lipids are invalid.   Lab Results  Component Value Date   CHOLHDL 4 11/12/2016   CHOLHDL 4 06/26/2016   CHOLHDL 6 05/07/2016   Lab Results  Component Value Date   LDLDIRECT 70.0 05/07/2016   LDLDIRECT 85.0 11/02/2015   LDLDIRECT 93.0 03/19/2015   On fenofibrate 54 mg and diet  Will hold it for 2 weeks to see if cramps get any better   Lab Results  Component Value Date   WBC 7.7 11/12/2016   HGB 13.9 11/12/2016   HCT 40.7 11/12/2016   MCV 90.3 11/12/2016   PLT 342.0 11/12/2016      Review of Systems Review of Systems  Constitutional: Negative for fever, appetite change,  and unexpected weight change.  Eyes: Negative for pain and visual disturbance.  Respiratory: Negative for wheeze  and shortness of breath.  pos for dry cough Cardiovascular:  Negative for cp or palpitations    Gastrointestinal: Negative for nausea, diarrhea and constipation.  Genitourinary: Negative for urgency and frequency.  Skin: Negative for pallor or rash   MSK pos for R arm pain , also muscle cramps in the legs  Neurological: Negative for weakness, light-headedness, numbness and headaches.  Hematological: Negative for adenopathy. Does not bruise/bleed easily.  Psychiatric/Behavioral: Negative for dysphoric mood. The patient is not nervous/anxious.  pos for more irritability lately       Objective:   Physical Exam  Constitutional: She appears well-developed and well-nourished. No distress.  overwt and well app  HENT:  Head: Normocephalic and atraumatic.  Mouth/Throat: Oropharynx is clear and moist. No oropharyngeal exudate.  No pnd   Eyes: Pupils are equal, round, and reactive to light. Conjunctivae and EOM are normal. Right eye exhibits no discharge. Left eye exhibits no discharge. No scleral icterus.  Neck: Normal range of motion. Neck supple. No JVD present. Carotid bruit is not present. No thyromegaly present.  Cardiovascular: Normal rate, regular rhythm, normal heart sounds and intact distal pulses.  Exam reveals no gallop.   Pulmonary/Chest: Effort normal and breath sounds normal. No respiratory distress. She has no wheezes. She has no rales.  No crackles Good air exch   Abdominal: Soft. Bowel sounds are normal. She exhibits no distension, no abdominal bruit and no mass. There is no tenderness.  Musculoskeletal: She exhibits tenderness. She exhibits no edema.  Tender over R elbow  Limited R hand grip due to pain   No atrophy in muscles of legs today  Lymphadenopathy:    She has no cervical adenopathy.  Neurological: She is alert. She has normal reflexes. No cranial nerve deficit. She exhibits normal muscle tone. Coordination normal.  No focal neuro changes   Skin: Skin is warm and dry. No rash noted. No pallor.  Psychiatric: She has a normal  mood and affect.  Nl affect  Frustrated today due to her medical problems           Assessment & Plan:   Problem List Items Addressed This Visit      Cardiovascular and Mediastinum   Essential hypertension - Primary    bp in fair control at this time  BP Readings from Last  1 Encounters:  11/18/16 118/70   She appears to be developing ace cough-so will change to 25 mg of losartan  Update if problems or side eff Disc lifstyle change with low sodium diet and exercise        Relevant Medications   losartan (COZAAR) 25 MG tablet   Other Relevant Orders   ECHOCARDIOGRAM COMPLETE     Endocrine   Diabetes type 2, controlled ([place])    Lab Results  Component Value Date   HGBA1C 6.6 (H) 11/12/2016   This is up a bit after vacation eating  She plans to get back on track now with dm diet  Exercise as tolerated F/u 3 mo      Relevant Medications   losartan (COZAAR) 25 MG tablet   Hypothyroidism    Hypothyroidism  Pt has no clinical changes No change in energy level/ hair or skin/ edema and no tremor Lab Results  Component Value Date   TSH 2.80 11/12/2016            Musculoskeletal and Integument   Trigger finger, acquired    Pt states this was injected a while back and only helped for a while  Now having arm/elbow pain  Ref to ortho made        Other   Arm pain, chronic    Ongoing R arm/elbow and hand pain are distressing her  Ref to ortho for eval  Pt states grip is painful to both hand and arm       Relevant Orders   Ambulatory referral to Orthopedic Surgery   Cough due to ACE inhibitor    Will change lisinopril to losartan and call if cough does not resolve      Family history of congenital heart disease    Brother with hypertrophic cardiomyopathy -told from a congenital problem (? What)  She has no symptoms  Will send for echocardiogram      Relevant Orders   ECHOCARDIOGRAM COMPLETE   Hypertriglyceridemia    Disc goals for lipids and reasons  to control them Rev labs with pt Rev low sat fat diet in detail Improved with fenofibrate   Due to leg cramps however will hold it 2 weeks to see if it contributes (doubtful)        Relevant Medications   losartan (COZAAR) 25 MG tablet   Leg cramps    Going on for years  Nl cmet  Recommend trial of magnesium  Also stretching or yoga Tonic water if it helps  Update if no improvement  ? Aunt has a muscular condition-she will find out what  Also hold fenofibrate 2 wk to see if that makes any difference and let me know

## 2016-11-18 NOTE — Assessment & Plan Note (Signed)
Brother with hypertrophic cardiomyopathy -told from a congenital problem (? What)  She has no symptoms  Will send for echocardiogram

## 2016-11-18 NOTE — Assessment & Plan Note (Signed)
Ongoing R arm/elbow and hand pain are distressing her  Ref to ortho for eval  Pt states grip is painful to both hand and arm

## 2016-11-18 NOTE — Assessment & Plan Note (Signed)
Pt states this was injected a while back and only helped for a while  Now having arm/elbow pain  Ref to ortho made

## 2016-11-18 NOTE — Assessment & Plan Note (Signed)
Will change lisinopril to losartan and call if cough does not resolve

## 2016-11-18 NOTE — Assessment & Plan Note (Signed)
Hypothyroidism  Pt has no clinical changes No change in energy level/ hair or skin/ edema and no tremor Lab Results  Component Value Date   TSH 2.80 11/12/2016

## 2016-11-19 ENCOUNTER — Other Ambulatory Visit: Payer: Self-pay | Admitting: Family Medicine

## 2016-11-19 DIAGNOSIS — Z8279 Family history of other congenital malformations, deformations and chromosomal abnormalities: Secondary | ICD-10-CM

## 2016-11-29 ENCOUNTER — Other Ambulatory Visit: Payer: Self-pay | Admitting: Family Medicine

## 2016-12-03 DIAGNOSIS — M7711 Lateral epicondylitis, right elbow: Secondary | ICD-10-CM | POA: Diagnosis not present

## 2016-12-03 DIAGNOSIS — M65331 Trigger finger, right middle finger: Secondary | ICD-10-CM | POA: Diagnosis not present

## 2016-12-08 ENCOUNTER — Ambulatory Visit (INDEPENDENT_AMBULATORY_CARE_PROVIDER_SITE_OTHER): Payer: PPO

## 2016-12-08 ENCOUNTER — Other Ambulatory Visit: Payer: Self-pay

## 2016-12-08 ENCOUNTER — Telehealth: Payer: Self-pay | Admitting: Family Medicine

## 2016-12-08 DIAGNOSIS — Z8279 Family history of other congenital malformations, deformations and chromosomal abnormalities: Secondary | ICD-10-CM | POA: Diagnosis not present

## 2016-12-08 NOTE — Telephone Encounter (Signed)
Please let pt know that echocardiogram was normal- very re assuring

## 2016-12-09 NOTE — Telephone Encounter (Signed)
Spoke to pt and informed her of results.  

## 2016-12-31 DIAGNOSIS — M7711 Lateral epicondylitis, right elbow: Secondary | ICD-10-CM | POA: Diagnosis not present

## 2016-12-31 DIAGNOSIS — M65331 Trigger finger, right middle finger: Secondary | ICD-10-CM | POA: Diagnosis not present

## 2017-01-29 DIAGNOSIS — M65331 Trigger finger, right middle finger: Secondary | ICD-10-CM | POA: Diagnosis not present

## 2017-01-29 DIAGNOSIS — M7711 Lateral epicondylitis, right elbow: Secondary | ICD-10-CM | POA: Diagnosis not present

## 2017-02-03 ENCOUNTER — Other Ambulatory Visit: Payer: Self-pay | Admitting: Family Medicine

## 2017-02-03 DIAGNOSIS — Z853 Personal history of malignant neoplasm of breast: Secondary | ICD-10-CM

## 2017-02-10 ENCOUNTER — Ambulatory Visit
Admission: RE | Admit: 2017-02-10 | Discharge: 2017-02-10 | Disposition: A | Discharge status: Home / Self Care | Payer: PPO | Source: Ambulatory Visit | Attending: Family Medicine | Admitting: Family Medicine

## 2017-02-10 DIAGNOSIS — Z853 Personal history of malignant neoplasm of breast: Secondary | ICD-10-CM

## 2017-02-10 DIAGNOSIS — R922 Inconclusive mammogram: Secondary | ICD-10-CM | POA: Diagnosis not present

## 2017-02-10 HISTORY — DX: Personal history of irradiation: Z92.3

## 2017-02-12 ENCOUNTER — Telehealth: Payer: Self-pay | Admitting: Family Medicine

## 2017-02-12 NOTE — Telephone Encounter (Signed)
PT was put on steroid inj by Dr. Grandville Silos. She pushed out her 51mo follow up 3 weeks b/c she was told these would effect her a1c numbers. She wanted you to know why she r/s and asked that you call if this is not ok.  New lab appt 11/5 Prev lab appt 10/16

## 2017-02-12 NOTE — Telephone Encounter (Signed)
That is fine 

## 2017-02-17 ENCOUNTER — Other Ambulatory Visit: Payer: PPO

## 2017-02-20 ENCOUNTER — Ambulatory Visit: Payer: PPO | Admitting: Family Medicine

## 2017-02-26 DIAGNOSIS — H1859 Other hereditary corneal dystrophies: Secondary | ICD-10-CM | POA: Diagnosis not present

## 2017-02-26 DIAGNOSIS — E119 Type 2 diabetes mellitus without complications: Secondary | ICD-10-CM | POA: Diagnosis not present

## 2017-02-26 DIAGNOSIS — H25813 Combined forms of age-related cataract, bilateral: Secondary | ICD-10-CM | POA: Diagnosis not present

## 2017-02-26 LAB — HM DIABETES EYE EXAM

## 2017-03-09 ENCOUNTER — Other Ambulatory Visit (INDEPENDENT_AMBULATORY_CARE_PROVIDER_SITE_OTHER): Payer: PPO

## 2017-03-09 DIAGNOSIS — E119 Type 2 diabetes mellitus without complications: Secondary | ICD-10-CM

## 2017-03-09 DIAGNOSIS — R252 Cramp and spasm: Secondary | ICD-10-CM

## 2017-03-09 DIAGNOSIS — E781 Pure hyperglyceridemia: Secondary | ICD-10-CM | POA: Diagnosis not present

## 2017-03-09 DIAGNOSIS — I10 Essential (primary) hypertension: Secondary | ICD-10-CM

## 2017-03-09 DIAGNOSIS — M5416 Radiculopathy, lumbar region: Secondary | ICD-10-CM | POA: Diagnosis not present

## 2017-03-09 LAB — LIPID PANEL
Cholesterol: 158 mg/dL (ref 0–200)
HDL: 40 mg/dL (ref >39.00)
LDL Cholesterol: 82 mg/dL (ref 0–99)
NONHDL: 117.76
Total CHOL/HDL Ratio: 4
Triglycerides: 178 mg/dL — ABNORMAL HIGH (ref 0.0–149.0)
VLDL: 35.6 mg/dL (ref 0.0–40.0)

## 2017-03-09 LAB — MAGNESIUM: MAGNESIUM: 1.8 mg/dL (ref 1.5–2.5)

## 2017-03-09 LAB — COMPREHENSIVE METABOLIC PANEL
ALBUMIN: 4.1 g/dL (ref 3.5–5.2)
ALK PHOS: 37 U/L — AB (ref 39–117)
ALT: 14 U/L (ref 0–35)
AST: 17 U/L (ref 0–37)
BUN: 13 mg/dL (ref 6–23)
CHLORIDE: 104 meq/L (ref 96–112)
CO2: 27 mEq/L (ref 19–32)
Calcium: 10 mg/dL (ref 8.4–10.5)
Creatinine, Ser: 0.91 mg/dL (ref 0.40–1.20)
GFR: 65.32 mL/min (ref >60.00)
Glucose, Bld: 139 mg/dL — ABNORMAL HIGH (ref 70–99)
POTASSIUM: 4.3 meq/L (ref 3.5–5.1)
SODIUM: 138 meq/L (ref 135–145)
Total Bilirubin: 0.5 mg/dL (ref 0.2–1.2)
Total Protein: 7.2 g/dL (ref 6.0–8.3)

## 2017-03-09 LAB — HEMOGLOBIN A1C: HEMOGLOBIN A1C: 6.6 % — AB (ref 4.6–6.5)

## 2017-03-10 ENCOUNTER — Ambulatory Visit (INDEPENDENT_AMBULATORY_CARE_PROVIDER_SITE_OTHER)
Admission: RE | Admit: 2017-03-10 | Discharge: 2017-03-10 | Disposition: A | Discharge status: Home / Self Care | Payer: PPO | Source: Ambulatory Visit | Attending: Family Medicine | Admitting: Family Medicine

## 2017-03-10 ENCOUNTER — Encounter: Payer: Self-pay | Admitting: Family Medicine

## 2017-03-10 ENCOUNTER — Encounter: Payer: Self-pay | Admitting: Gastroenterology

## 2017-03-10 ENCOUNTER — Ambulatory Visit: Payer: PPO | Admitting: Family Medicine

## 2017-03-10 VITALS — BP 126/62 | HR 79 | Temp 98.2°F | Ht 66.0 in | Wt 184.2 lb

## 2017-03-10 DIAGNOSIS — E119 Type 2 diabetes mellitus without complications: Secondary | ICD-10-CM

## 2017-03-10 DIAGNOSIS — R252 Cramp and spasm: Secondary | ICD-10-CM | POA: Diagnosis not present

## 2017-03-10 DIAGNOSIS — E781 Pure hyperglyceridemia: Secondary | ICD-10-CM

## 2017-03-10 DIAGNOSIS — G8929 Other chronic pain: Secondary | ICD-10-CM | POA: Diagnosis not present

## 2017-03-10 DIAGNOSIS — M79601 Pain in right arm: Secondary | ICD-10-CM | POA: Diagnosis not present

## 2017-03-10 DIAGNOSIS — R079 Chest pain, unspecified: Secondary | ICD-10-CM | POA: Diagnosis not present

## 2017-03-10 DIAGNOSIS — Z1211 Encounter for screening for malignant neoplasm of colon: Secondary | ICD-10-CM

## 2017-03-10 DIAGNOSIS — I10 Essential (primary) hypertension: Secondary | ICD-10-CM | POA: Diagnosis not present

## 2017-03-10 DIAGNOSIS — R0789 Other chest pain: Secondary | ICD-10-CM

## 2017-03-10 DIAGNOSIS — Z23 Encounter for immunization: Secondary | ICD-10-CM | POA: Diagnosis not present

## 2017-03-10 NOTE — Assessment & Plan Note (Signed)
On arb-less cough bp in fair control at this time  BP Readings from Last 1 Encounters:  03/10/17 126/62   No changes needed Disc lifstyle change with low sodium diet and exercise    Pt unsure if arb is making her chest wall hurt (doubtful)- will hold it for short period of time to see and update [name]

## 2017-03-10 NOTE — Assessment & Plan Note (Signed)
Suspect muscular vs costochondritis Nl exam with tenderness of L ant ribs CXR today  Recommend use of heat/analgesics  Continue deep breaths to prev atelectasis

## 2017-03-10 NOTE — Progress Notes (Signed)
Subjective:    Patient ID: Carla Mccoy, female    DOB: Jul 18, 1950, 66 y.o.   MRN: [id number]  HPI Here for f/u of chronic health problems   Saw Dr Grandville Silos and Dr Jacelyn Grip for arm (hand)  and back pain  Took gabapentin and meloxicam  Did not sleep for weeks due to pain  Will get MRI of LS later this week   Planning surgery for her hand - carpal tunnel and trigger finger (did have steroid injection)     Wt Readings from Last 3 Encounters:  03/10/17 184 lb 4 oz (83.6 kg)  11/18/16 185 lb (83.9 kg)  06/26/16 179 lb 8 oz (81.4 kg)  overall stable  29.74 kg/m   bp is stable today  No cp or palpitations or headaches or edema  No side effects to medicines  BP Readings from Last 3 Encounters:  03/10/17 126/62  11/18/16 118/70  06/26/16 120/80     Changed from ace to arb last visit for  Still very slight cough   She has occ pain under L breast - had a cardiac w/u (nl echo)  1 week / no trauma but does lift grand daughter  Colon [place] if it is coming from losartan  Hurts to take a deep breath - feels gassy also / burping  Ribs on L are tender to the touch Hx of breast cancer surgery on that side   DM2 On metformin  Lab Results  Component Value Date   HGBA1C 6.6 (H) 03/09/2017   This is stable from last time  Eye exam  Arb for renal protection  Diet -is mindful of carbs  Has not been able to exercise and steroid shots- but eating very well   Hyperlipidemia (triglycerides) Lab Results  Component Value Date   CHOL 158 03/09/2017   CHOL 164 11/12/2016   CHOL 181 06/26/2016   Lab Results  Component Value Date   HDL 40.00 03/09/2017   HDL 43.90 11/12/2016   HDL 45.60 06/26/2016   Lab Results  Component Value Date   LDLCALC 82 03/09/2017   LDLCALC 81 11/12/2016   LDLCALC 99 06/26/2016   Lab Results  Component Value Date   TRIG 178.0 (H) 03/09/2017   TRIG 200.0 (H) 11/12/2016   TRIG 182.0 (H) 06/26/2016   Lab Results  Component Value Date   CHOLHDL 4  03/09/2017   CHOLHDL 4 11/12/2016   CHOLHDL 4 06/26/2016   Lab Results  Component Value Date   LDLDIRECT 70.0 05/07/2016   LDLDIRECT 85.0 11/02/2015   LDLDIRECT 93.0 03/19/2015   Last visit ? Whether fenofibrate caused muscle cramps  She held it for 2 weeks and leg cramps and it did not make a difference   Magnesium level is nl at 1.8  Lab Results  Component Value Date   CREATININE 0.91 03/09/2017   BUN 13 03/09/2017   NA 138 03/09/2017   K 4.3 03/09/2017   CL 104 03/09/2017   CO2 27 03/09/2017   Lab Results  Component Value Date   ALT 14 03/09/2017   AST 17 03/09/2017   ALKPHOS 37 (L) 03/09/2017   BILITOT 0.5 03/09/2017    Due for colonoscopy - would like to do in Dec if poss   Patient Active Problem List   Diagnosis Date Noted  . Left-sided chest wall pain 03/10/2017  . Leg cramps 11/18/2016  . Family history of congenital heart disease 11/18/2016  . Arm pain, chronic 11/18/2016  . Cough due to  ACE inhibitor 11/18/2016  . Breast density 02/27/2016  . Routine general medical examination at a health care facility 03/26/2015  . Colon cancer screening 03/24/2014  . Encounter for Medicare annual wellness exam 03/24/2014  . Diabetes type 2, controlled ([place]) 03/17/2014  . Status post debridement 09/30/2013  . Trigger finger, acquired 08/26/2013  . Benign paroxysmal positional vertigo 08/26/2013  . OSA (obstructive sleep apnea) 08/10/2013  . Chronic mastitis of left breast 07/04/2013  . Breast abscess s/p I&D 09/14/2013 03/28/2013  . Left breast abscess 03/23/2013  . Breast cancer ([place]) 02/10/2012  . Routine health maintenance 09/06/2011  . LEG CRAMPS 04/12/2009  . 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 BACK PAIN, CHRONIC 05/10/2007  . PEPTIC ULCER DISEASE, HX OF 05/10/2007  . HYSTERECTOMY, HX OF 05/10/2007  . TONSILLECTOMY, HX OF 05/10/2007  . Hypothyroidism 01/28/2007  . Hypertriglyceridemia 01/28/2007  . Overweight 01/28/2007  . COMMON MIGRAINE 01/28/2007  .  Essential hypertension 01/28/2007  . FATTY LIVER DISEASE 01/28/2007  . ABNORMAL GLUCOSE NEC 01/28/2007  . ANGIOEDEMA 01/28/2007  . CARPAL TUNNEL SYNDROME, HX OF 01/27/2007   Past Medical History:  Diagnosis Date  . 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 Backache, unspecified   . breast ca dx'd 06/2009   breast cancer left - lumpectomy  . Diabetes mellitus without complication ([place])   . Full dentures   . Migraine without aura, without mention of intractable migraine without mention of status migrainosus   . Obesity, unspecified   . Other abnormal glucose   . Other and unspecified hyperlipidemia   . Other chronic nonalcoholic liver disease   . Personal history of peptic ulcer disease   . Personal history of radiation therapy   . 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 Tachy-brady syndrome ([place])   . Unspecified essential hypertension 09/12/13   remote history; no current treatment  . Unspecified hypothyroidism   . Wears glasses    Past Surgical History:  Procedure Laterality Date  . ABDOMINAL HYSTERECTOMY  1988  . BREAST BIOPSY Left    in the 80's - benign tissue  . BREAST LUMPECTOMY Left 2011   Feb '11  . LAPAROSCOPY ABDOMEN DIAGNOSTIC    . TONSILLECTOMY    . TUBAL LIGATION  1971   Social History   Tobacco Use  . Smoking status: Never Smoker  . Smokeless tobacco: Never Used  Substance Use Topics  . Alcohol use: No    Alcohol/week: 0.0 oz  . Drug use: No   Family History  Problem Relation Age of Onset  . Peripheral vascular disease Mother   . Coronary artery disease Mother   . Hyperlipidemia Mother   . Heart disease Mother        CAD/fatal MI  . Diabetes Mother   . Stroke Mother   . Diabetes Sister   . Hypothyroidism Sister   . Diabetes Brother   . Hyperlipidemia Brother   . Heart disease Brother   . Hypertrophic cardiomyopathy Brother        congenital   Allergies  Allergen Reactions  .  Metoprolol     Lips swelled/ hands swelled and her skin turned red   . Sulfonamide Derivatives    Current Outpatient Medications on File Prior to Visit  Medication Sig Dispense Refill  . Cholecalciferol (VITAMIN D3) 2000 UNITS TABS Take by mouth.      . fenofibrate 54 MG tablet Take 1 tablet (54 mg total) by  mouth daily. 90 tablet 3  . levothyroxine (SYNTHROID, LEVOTHROID) 125 MCG tablet TAKE 1 TABLET BY MOUTH ONCE DAILY 90 tablet 1  . losartan (COZAAR) 25 MG tablet Take 1 tablet (25 mg total) by mouth daily. 90 tablet 3  . metFORMIN (GLUCOPHAGE) 500 MG tablet TAKE ONE TABLET BY MOUTH TWICE DAILY WITH MEALS 180 tablet 1  . Multiple Vitamins-Minerals (MULTIVITAMIN,TX-MINERALS) tablet Take 1 tablet by mouth 2 (two) times a week.     . Omega-3 Fatty Acids (OMEGA 3 PO) Take 1 capsule by mouth 2 (two) times a week.     . vitamin C (ASCORBIC ACID) 500 MG tablet Take 500 mg by mouth daily.    . vitamin E 100 UNIT capsule Take 100 Units by mouth daily.      No current facility-administered medications on file prior to visit.     Review of Systems  Constitutional: Positive for fatigue. Negative for activity change, appetite change, chills and unexpected weight change.  HENT: Negative for congestion, ear pain, rhinorrhea, sinus pressure and sore throat.   Eyes: Negative for pain, redness and visual disturbance.  Respiratory: Negative for cough, shortness of breath and wheezing.        Pos for chest wall pain with deep insp  Cardiovascular: Negative for chest pain and palpitations.  Gastrointestinal: Negative for abdominal pain, blood in stool, constipation and diarrhea.  Endocrine: Negative for polydipsia and polyuria.  Genitourinary: Negative for dysuria, frequency and urgency.  Musculoskeletal: Positive for arthralgias and back pain. Negative for myalgias.  Skin: Negative for pallor and rash.  Allergic/Immunologic: Negative for environmental allergies.  Neurological: Negative for dizziness,  syncope and headaches.       Pos for R carpal tunnel  Hematological: Negative for adenopathy. Does not bruise/bleed easily.  Psychiatric/Behavioral: Negative for decreased concentration and dysphoric mood. The patient is not nervous/anxious.        Objective:   Physical Exam  Constitutional: She appears well-developed and well-nourished. No distress.  overwt and well app  HENT:  Head: Normocephalic and atraumatic.  Mouth/Throat: Oropharynx is clear and moist.  Eyes: Conjunctivae and EOM are normal. Pupils are equal, round, and reactive to light.  Neck: Normal range of motion. Neck supple. No JVD present. Carotid bruit is not present. No thyromegaly present.  Cardiovascular: Normal rate, regular rhythm, normal heart sounds and intact distal pulses. Exam reveals no gallop.  Pulmonary/Chest: Effort normal and breath sounds normal. No respiratory distress. She has no wheezes. She has no rales. She exhibits tenderness.  No crackles  Tender over L ant/lat ribs No crepitus or skin change   Abdominal: Soft. Bowel sounds are normal. She exhibits no distension, no abdominal bruit and no mass. There is no tenderness.  Musculoskeletal: She exhibits no edema.  Wrist brace on R   Limited rom of LS   Lymphadenopathy:    She has no cervical adenopathy.  Neurological: She is alert. She has normal reflexes. No cranial nerve deficit. She exhibits normal muscle tone. Coordination normal.  Skin: Skin is warm and dry. No rash noted. No pallor.  Psychiatric: She has a normal mood and affect.          Assessment & Plan:   Problem List Items Addressed This Visit      Cardiovascular and Mediastinum   Essential hypertension - Primary    On arb-less cough bp in fair control at this time  BP Readings from Last 1 Encounters:  03/10/17 126/62   No changes needed Disc lifstyle  change with low sodium diet and exercise    Pt unsure if arb is making her chest wall hurt (doubtful)- will hold it for  short period of time to see and update [name]         Endocrine   Diabetes type 2, controlled ([place])    Lab Results  Component Value Date   HGBA1C 6.6 (H) 03/09/2017   Stable Diet is improved but unable to exercise Sent for eye exam report Disc foot care Flu and pna 23 vaccines today  F/u 6 mo  Wt loss enc        Other   Arm pain, chronic    Carpal tunnel diagnosed  For future surgery       Colon cancer screening    Due for colonoscopy  Ref done       Relevant Orders   Ambulatory referral to Gastroenterology   Hypertriglyceridemia    Stopping fenofibrate did not help leg cramps so she started it back  Disc goals for lipids and reasons to control them Rev labs with pt Rev low sat fat diet in detail Fairly good control      Left-sided chest wall pain    Suspect muscular vs costochondritis Nl exam with tenderness of L ant ribs CXR today  Recommend use of heat/analgesics  Continue deep breaths to prev atelectasis       Relevant Orders   DG Chest 2 View   Leg cramps    No change  Mag level nl        Other Visit Diagnoses    Need for influenza vaccination       Relevant Orders   Flu Vaccine QUAD 6+ mos PF IM (Fluarix Quad PF) (Completed)   Need for 23-polyvalent pneumococcal polysaccharide vaccine       Relevant Orders   Pneumococcal polysaccharide vaccine 23-valent greater than or equal to 2yo subcutaneous/IM (Completed)

## 2017-03-10 NOTE — Assessment & Plan Note (Signed)
No change  Mag level nl

## 2017-03-10 NOTE — Assessment & Plan Note (Signed)
Lab Results  Component Value Date   HGBA1C 6.6 (H) 03/09/2017   Stable Diet is improved but unable to exercise Sent for eye exam report Disc foot care Flu and pna 23 vaccines today  F/u 6 mo  Wt loss enc

## 2017-03-10 NOTE — Assessment & Plan Note (Signed)
Carpal tunnel diagnosed  For future surgery

## 2017-03-10 NOTE — Assessment & Plan Note (Signed)
Due for colonoscopy Ref done 

## 2017-03-10 NOTE — Patient Instructions (Addendum)
Try to get most of your carbohydrates from produce (with the exception of white potatoes)  Eat less bread/pasta/rice/snack foods/cereals/sweets and other items from the middle of the grocery store (processed carbs)   Hold losartan for 2 weeks to see if it may be causing your chest wall pain (I doubt it)   Flu shot today  Pneumonia shot today   We will send for your eye exam report   We will refer for colonoscopy   Chest xray today for chest wall pain   Labs are stable   Set up a physical for 6 months

## 2017-03-10 NOTE — Assessment & Plan Note (Signed)
Stopping fenofibrate did not help leg cramps so she started it back  Disc goals for lipids and reasons to control them Rev labs with pt Rev low sat fat diet in detail Fairly good control

## 2017-03-10 NOTE — Assessment & Plan Note (Signed)
Seeing Dr Jacelyn Grip For MRI soon

## 2017-03-19 DIAGNOSIS — M545 Low back pain: Secondary | ICD-10-CM | POA: Diagnosis not present

## 2017-03-23 ENCOUNTER — Other Ambulatory Visit: Payer: Self-pay | Admitting: Family Medicine

## 2017-03-23 ENCOUNTER — Encounter: Payer: Self-pay | Admitting: Family Medicine

## 2017-03-24 DIAGNOSIS — M5416 Radiculopathy, lumbar region: Secondary | ICD-10-CM | POA: Diagnosis not present

## 2017-04-16 DIAGNOSIS — M5416 Radiculopathy, lumbar region: Secondary | ICD-10-CM | POA: Diagnosis not present

## 2017-04-23 ENCOUNTER — Ambulatory Visit (AMBULATORY_SURGERY_CENTER): Payer: Self-pay

## 2017-04-23 ENCOUNTER — Other Ambulatory Visit: Payer: Self-pay

## 2017-04-23 VITALS — Ht 66.5 in | Wt 186.0 lb

## 2017-04-23 DIAGNOSIS — Z86010 Personal history of colonic polyps: Secondary | ICD-10-CM

## 2017-04-23 MED ORDER — PLENVU 140 G PO SOLR: 1.0000 | Freq: Once | ORAL | 0 refills | Status: AC

## 2017-04-23 NOTE — Progress Notes (Signed)
Denies allergies to eggs or soy products. Denies complication of anesthesia or sedation. Denies use of weight loss medication. Denies use of O2.   Emmi instructions declined.  

## 2017-04-29 ENCOUNTER — Telehealth: Payer: Self-pay | Admitting: Gastroenterology

## 2017-04-30 NOTE — Telephone Encounter (Signed)
Dr Silverio Decamp,    This lady wants to use Miralax as the plenvu is too expensive for her .   You had mentioned to send these to you as your instructions are different for Miralax .  Please advise  Thanks, Lelan Pons

## 2017-04-30 NOTE — Telephone Encounter (Signed)
Her renal function is fine. Please advise patient to drink 1 bottle of Mag citrate followed by split dose of Miralax with gatorade. Thanks

## 2017-04-30 NOTE — Telephone Encounter (Signed)
SPOKE WITH PT- INSTRUCTED HER OK FOR CHANGE TO MIRA WITH MAG CIT AS PER DR NANDIGAM- NEW INSTRUTIONS PRINTED - PT TO PICK UP 4TH FLOOR TODAY OR TOMORROW.[name]  MARIE PV

## 2017-05-12 DIAGNOSIS — M5416 Radiculopathy, lumbar region: Secondary | ICD-10-CM | POA: Diagnosis not present

## 2017-05-14 ENCOUNTER — Encounter: Payer: Self-pay | Admitting: Gastroenterology

## 2017-05-14 ENCOUNTER — Ambulatory Visit (AMBULATORY_SURGERY_CENTER): Payer: PPO | Admitting: Gastroenterology

## 2017-05-14 VITALS — BP 128/73 | HR 79 | Temp 97.8°F | Resp 25 | Ht 66.0 in | Wt 186.0 lb

## 2017-05-14 DIAGNOSIS — Z1212 Encounter for screening for malignant neoplasm of rectum: Secondary | ICD-10-CM | POA: Diagnosis not present

## 2017-05-14 DIAGNOSIS — Z86010 Personal history of colonic polyps: Secondary | ICD-10-CM | POA: Diagnosis not present

## 2017-05-14 DIAGNOSIS — Z1211 Encounter for screening for malignant neoplasm of colon: Secondary | ICD-10-CM

## 2017-05-14 MED ORDER — SODIUM CHLORIDE 0.9 % IV SOLN: 500.0000 mL | Freq: Once | INTRAVENOUS | Status: DC

## 2017-05-14 NOTE — Progress Notes (Signed)
Pt's states no medical or surgical changes since previsit or office visit. 

## 2017-05-14 NOTE — Op Note (Signed)
[facility] Patient Name: Carla Mccoy Procedure Date: 05/14/2017 7:45 AM MRN: [id number] Endoscopist: Mauri Pole , MD Age: 67 Referring MD:  Date of Birth: Jan 15, 1951 Gender: Female Account #: [id number] Procedure:                Colonoscopy Indications:              Screening for colorectal malignant neoplasm, Last                            colonoscopy: 2008 Medicines:                Monitored Anesthesia Care Procedure:                Pre-Anesthesia Assessment:                           - Prior to the procedure, a History and Physical                            was performed, and patient medications and                            allergies were reviewed. The patient's tolerance of                            previous anesthesia was also reviewed. The risks                            and benefits of the procedure and the sedation                            options and risks were discussed with the patient.                            All questions were answered, and informed consent                            was obtained. Prior Anticoagulants: The patient has                            taken no previous anticoagulant or antiplatelet                            agents. ASA Grade Assessment: III - A patient with                            severe systemic disease. After reviewing the risks                            and benefits, the patient was deemed in                            satisfactory condition to undergo the procedure.  After obtaining informed consent, the colonoscope                            was passed under direct vision. Throughout the                            procedure, the patient's blood pressure, pulse, and                            oxygen saturations were monitored continuously. The                            Colonoscope was introduced through the anus and                            advanced to the the cecum,  identified by                            appendiceal orifice and ileocecal valve. The                            colonoscopy was performed without difficulty. The                            patient tolerated the procedure well. The quality                            of the bowel preparation was excellent. The                            ileocecal valve, appendiceal orifice, and rectum                            were photographed. Scope In: 8:11:22 AM Scope Out: 8:35:03 AM Scope Withdrawal Time: 0 hours 13 minutes 10 seconds  Total Procedure Duration: 0 hours 23 minutes 41 seconds  Findings:                 The perianal and digital rectal examinations were                            normal.                           Multiple small and large-mouthed diverticula were                            found in the entire colon. There was evidence of                            diverticular spasm. There was evidence of an                            impacted diverticulum. There was no evidence of  diverticular bleeding.                           Non-bleeding internal hemorrhoids were found during                            retroflexion. The hemorrhoids were small.                           The exam was otherwise without abnormality. Complications:            No immediate complications. Estimated Blood Loss:     Estimated blood loss: none. Impression:               - Severe diverticulosis in the entire examined                            colon. There was evidence of diverticular spasm.                            There was evidence of an impacted diverticulum.                            There was no evidence of diverticular bleeding.                           - Non-bleeding internal hemorrhoids.                           - The examination was otherwise normal.                           - No specimens collected. Recommendation:           - Patient has a contact number available  for                            emergencies. The signs and symptoms of potential                            delayed complications were discussed with the                            patient. Return to normal activities tomorrow.                            Written discharge instructions were provided to the                            patient.                           - Resume previous diet.                           - Continue present medications.                           -  Repeat colonoscopy in 10 years for screening                            purposes. Mauri Pole, MD 05/14/2017 8:40:10 AM This report has been signed electronically.

## 2017-05-14 NOTE — Patient Instructions (Signed)
Information given on hemorrhoids and diverticulosis.   YOU HAD AN ENDOSCOPIC PROCEDURE TODAY AT [facility]:   Refer to the procedure report that was given to you for any specific questions about what was found during the examination.  If the procedure report does not answer your questions, please call your gastroenterologist to clarify.  If you requested that your care partner not be given the details of your procedure findings, then the procedure report has been included in a sealed envelope for you to review at your convenience later.  YOU SHOULD EXPECT: Some feelings of bloating in the abdomen. Passage of more gas than usual.  Walking can help get rid of the air that was put into your GI tract during the procedure and reduce the bloating. If you had a lower endoscopy (such as a colonoscopy or flexible sigmoidoscopy) you may notice spotting of blood in your stool or on the toilet paper. If you underwent a bowel prep for your procedure, you may not have a normal bowel movement for a few days.  Please Note:  You might notice some irritation and congestion in your nose or some drainage.  This is from the oxygen used during your procedure.  There is no need for concern and it should clear up in a day or so.  SYMPTOMS TO REPORT IMMEDIATELY:   Following lower endoscopy (colonoscopy or flexible sigmoidoscopy):  Excessive amounts of blood in the stool  Significant tenderness or worsening of abdominal pains  Swelling of the abdomen that is new, acute  Fever of 100F or higher  For urgent or emergent issues, a gastroenterologist can be reached at any hour by calling [phone].   DIET:  We do recommend a small meal at first, but then you may proceed to your regular diet.  Drink plenty of fluids but you should avoid alcoholic beverages for 24 hours.  ACTIVITY:  You should plan to take it easy for the rest of today and you should NOT DRIVE or use heavy machinery until tomorrow  (because of the sedation medicines used during the test).    FOLLOW UP: Our staff will call the number listed on your records the next business day following your procedure to check on you and address any questions or concerns that you may have regarding the information given to you following your procedure. If we do not reach you, we will leave a message.  However, if you are feeling well and you are not experiencing any problems, there is no need to return our call.  We will assume that you have returned to your regular daily activities without incident.  If any biopsies were taken you will be contacted by phone or by letter within the next 1-3 weeks.  Please call us at [phone] if you have not heard about the biopsies in 3 weeks.    SIGNATURES/CONFIDENTIALITY: You and/or your care partner have signed paperwork which will be entered into your electronic medical record.  These signatures attest to the fact that that the information above on your After Visit Summary has been reviewed and is understood.  Full responsibility of the confidentiality of this discharge information lies with you and/or your care-partner.

## 2017-05-14 NOTE — Progress Notes (Signed)
Report given to PACU, vss 

## 2017-05-15 ENCOUNTER — Telehealth: Payer: Self-pay | Admitting: *Deleted

## 2017-05-15 NOTE — Telephone Encounter (Signed)
  Follow up Call-  Call back number 05/14/2017  Post procedure Call Back phone  # [phone]  Permission to leave phone message Yes  Some recent data might be hidden     Patient questions:  Do you have a fever, pain , or abdominal swelling? No. Pain Score  0 *  Have you tolerated food without any problems? Yes.    Have you been able to return to your normal activities? Yes.    Do you have any questions about your discharge instructions: Diet   No. Medications  No. Follow up visit  No.  Do you have questions or concerns about your Care? No.  Actions: * If pain score is 4 or above: No action needed, pain <4.

## 2017-05-15 NOTE — Telephone Encounter (Signed)
No answer. Number identifier. Message left to call if questions or concerns and we will make an attempt to call later in the day.

## 2017-05-19 ENCOUNTER — Other Ambulatory Visit: Payer: Self-pay | Admitting: Family Medicine

## 2017-05-27 ENCOUNTER — Other Ambulatory Visit: Payer: Self-pay | Admitting: Family Medicine

## 2017-07-08 NOTE — Progress Notes (Signed)
This encounter was created in error - please disregard.

## 2017-09-10 ENCOUNTER — Ambulatory Visit: Payer: PPO

## 2017-09-14 ENCOUNTER — Other Ambulatory Visit: Payer: Self-pay | Admitting: Family Medicine

## 2017-09-14 ENCOUNTER — Other Ambulatory Visit: Payer: Self-pay

## 2017-09-14 ENCOUNTER — Encounter: Payer: Self-pay | Admitting: *Deleted

## 2017-09-14 ENCOUNTER — Encounter: Payer: Self-pay | Admitting: Family Medicine

## 2017-09-14 ENCOUNTER — Ambulatory Visit (INDEPENDENT_AMBULATORY_CARE_PROVIDER_SITE_OTHER): Payer: PPO | Admitting: Family Medicine

## 2017-09-14 VITALS — BP 100/60 | HR 95 | Temp 98.4°F | Ht 66.0 in | Wt 186.2 lb

## 2017-09-14 DIAGNOSIS — J189 Pneumonia, unspecified organism: Secondary | ICD-10-CM

## 2017-09-14 MED ORDER — HYDROCOD POLST-CPM POLST ER 10-8 MG/5ML PO SUER: 5.0000 mL | Freq: Every evening | ORAL | 0 refills | Status: DC | PRN

## 2017-09-14 MED ORDER — DOXYCYCLINE HYCLATE 100 MG PO TABS: 100.0000 mg | ORAL_TABLET | Freq: Two times a day (BID) | ORAL | 0 refills | Status: AC

## 2017-09-14 NOTE — Progress Notes (Signed)
Dr. Karleen Hampshire T. Antionio Negron, MD, CAQ Sports [facility] [address] [place], [address] Phone: [phone] Fax: [phone]  09/14/2017  Patient: Carla Mccoy, MRN: [id number], DOB: 1951-04-03, 67 y.o.  Primary Physician:  [name], Audrie Gallus, MD   Chief Complaint  Patient presents with  . Cough  . Nasal Congestion  . Ear Pain  . Scracthy Throat   Subjective:   Carla Mccoy is a 67 y.o. very pleasant female patient who presents with the following:  10 days sick, and has been off and on since last December. Has a lot of sinus drainage and has a croupy cough often. Granddaughter going to preschool. Has been running a low grade fever. Quite productive cough.  The last three nights has slept in a recliner. Coughing fit this morning.  Nose is a little better.  Cough is really bad on her.   Past Medical History, Surgical History, Social History, Family History, Problem List, Medications, and Allergies have been reviewed and updated if relevant.  Patient Active Problem List   Diagnosis Date Noted  . Left-sided chest wall pain 03/10/2017  . Leg cramps 11/18/2016  . Family history of congenital heart disease 11/18/2016  . Arm pain, chronic 11/18/2016  . Cough due to ACE inhibitor 11/18/2016  . Breast density 02/27/2016  . Routine general medical examination at a health care facility 03/26/2015  . Colon cancer screening 03/24/2014  . Encounter for Medicare annual wellness exam 03/24/2014  . Diabetes type 2, controlled (HCC) 03/17/2014  . Status post debridement 09/30/2013  . Trigger finger, acquired 08/26/2013  . Benign paroxysmal positional vertigo 08/26/2013  . OSA (obstructive sleep apnea) 08/10/2013  . Chronic mastitis of left breast 07/04/2013  . Breast abscess s/p I&D 09/14/2013 03/28/2013  . Left breast abscess 03/23/2013  . Breast cancer (HCC) 02/10/2012  . Routine health maintenance 09/06/2011  . LEG CRAMPS 04/12/2009  . 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 BACK PAIN, CHRONIC 05/10/2007  . PEPTIC ULCER DISEASE, HX OF 05/10/2007  . HYSTERECTOMY, HX OF 05/10/2007  . TONSILLECTOMY, HX OF 05/10/2007  . Hypothyroidism 01/28/2007  . Hypertriglyceridemia 01/28/2007  . Overweight 01/28/2007  . COMMON MIGRAINE 01/28/2007  . Essential hypertension 01/28/2007  . FATTY LIVER DISEASE 01/28/2007  . ABNORMAL GLUCOSE NEC 01/28/2007  . ANGIOEDEMA 01/28/2007  . CARPAL TUNNEL SYNDROME, HX OF 01/27/2007    Past Medical History:  Diagnosis Date  . Allergy   . 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 Arthritis   . Backache, unspecified   . breast ca dx'd 06/2009   breast cancer left - lumpectomy  . Diabetes mellitus without complication (HCC)   . Full dentures   . Migraine without aura, without mention of intractable migraine without mention of status migrainosus   . Obesity, unspecified   . Other abnormal glucose   . Other and unspecified hyperlipidemia   . Other chronic nonalcoholic liver disease   . Personal history of peptic ulcer disease   . Personal history of radiation therapy   . Rheumatic chorea without mention of heart involvement age 89   syndeham chorea  . Sleep apnea   . Tachy-brady syndrome (HCC)   . Unspecified essential hypertension 09/12/13   remote history; no current treatment  . Unspecified hypothyroidism   . Wears glasses     Past Surgical History:  Procedure Laterality Date  . ABDOMINAL HYSTERECTOMY  1988  . BREAST BIOPSY Left    in  the 80's - benign tissue  . BREAST LUMPECTOMY Left 2011   Feb '11  . Breast Surgery x 2 Left   . INCISION AND DRAINAGE OF WOUND Left 09/14/2013   Procedure: IRRIGATION AND DEBRIDEMENT LEFT BREAST ABSCESS;  Surgeon: Maisie Fus A. Cornett, MD;  Location: [facility];  Service: General;  Laterality: Left;  . INCISION AND DRAINAGE OF WOUND Left 12/21/2013   Procedure: DEBRIDEMENT OF LEFT BREAST WOUND WITH PLACEMENT OF A CELL;  Surgeon: Wayland Denis, DO;  Location: [facility];  Service: Plastics;  Laterality: Left;  . LAPAROSCOPY ABDOMEN DIAGNOSTIC    . TONSILLECTOMY    . TUBAL LIGATION  1971    Social History   Socioeconomic History  . Marital status: Married    Spouse name: Not on file  . Number of children: Not on file  . Years of education: 40  . Highest education level: Not on file  Occupational History  . Occupation: Administrator, arts: [employer]  Social Needs  . Financial resource strain: Not on file  . Food insecurity:    Worry: Not on file    Inability: Not on file  . Transportation needs:    Medical: Not on file    Non-medical: Not on file  Tobacco Use  . Smoking status: Never Smoker  . Smokeless tobacco: Never Used  Substance and Sexual Activity  . Alcohol use: No    Alcohol/week: 0.0 oz  . Drug use: No  . Sexual activity: Yes    Partners: Male  Lifestyle  . Physical activity:    Days per week: Not on file    Minutes per session: Not on file  . Stress: Not on file  Relationships  . Social connections:    Talks on phone: Not on file    Gets together: Not on file    Attends religious service: Not on file    Active member of club or organization: Not on file    Attends meetings of clubs or organizations: Not on file    Relationship status: Not on file  . Intimate partner violence:    Fear of current or ex partner: Not on file    Emotionally abused: Not on file    Physically abused: Not on file    Forced sexual activity: Not on file  Other Topics Concern  . Not on file  Social History Narrative   HSG, [employer] - 3 yrs, no degree. Married -'67  1 son -'71, 1 daughter-'70; 2 grandchildren.  Work - Patent examiner for [employer].  Marriage is in good health. She enjoys her work.                         Family History  Problem Relation Age of Onset  . Peripheral vascular disease Mother   . Coronary artery disease Mother   . Hyperlipidemia Mother     . Heart disease Mother        CAD/fatal MI  . Diabetes Mother   . Stroke Mother   . Diabetes Sister   . Hypothyroidism Sister   . Diabetes Brother   . Hyperlipidemia Brother   . Heart disease Brother   . Hypertrophic cardiomyopathy Brother        congenital  . Colon cancer Neg Hx   . Esophageal cancer Neg Hx   . Pancreatic cancer Neg Hx   . Stomach cancer Neg Hx   .  Rectal cancer Neg Hx     Allergies  Allergen Reactions  . Metoprolol     Lips swelled/ hands swelled and her skin turned red   . Sulfonamide Derivatives     Medication list reviewed and updated in full in [facility].  ROS: GEN: Acute illness details above GI: Tolerating PO intake GU: maintaining adequate hydration and urination Pulm: No SOB Interactive and getting along well at home.  Otherwise, ROS is as per the HPI.  Objective:   BP 100/60   Pulse 95   Temp 98.4 F (36.9 C) (Oral)   Ht 5\' 6"  (1.676 m)   Wt 186 lb 4 oz (84.5 kg)   SpO2 97%   BMI 30.06 kg/m    GEN: A and O x 3. WDWN. NAD.    ENT: Nose clear, ext NML.  No LAD.  No JVD.  TM's clear. Oropharynx clear.  PULM: Normal WOB, no distress. Rare scattered crackles, no wheezes, diffuse B rhonchi. CV: RRR, no M/G/R, No rubs, No JVD.   EXT: warm and well-perfused, No c/c/e. PSYCH: Pleasant and conversant.    Laboratory and Imaging Data:  Assessment and Plan:   Atypical pneumonia  Diffuse changes on exam.  Walking pneumonia given timeframe, bad viral bronchitis also could not be excluded.  Given clinical presentation, treated with doxycycline.  Supportive care, patient was also given some Tussionex, and given precautions regarding driving or heavy machinery use.  Follow-up: No follow-ups on file.  Meds ordered this encounter  Medications  . doxycycline (VIBRA-TABS) 100 MG tablet    Sig: Take 1 tablet (100 mg total) by mouth 2 (two) times daily for 10 days.    Dispense:  20 tablet    Refill:  0  .  chlorpheniramine-HYDROcodone (TUSSIONEX PENNKINETIC ER) 10-8 MG/5ML SUER    Sig: Take 5 mLs by mouth at bedtime as needed for cough.    Dispense:  120 mL    Refill:  0   Signed,  Janssen Zee T. Nour Scalise, MD   Allergies as of 09/14/2017      Reactions   Metoprolol    Lips swelled/ hands swelled and her skin turned red    Sulfonamide Derivatives       Medication List        Accurate as of 09/14/17 11:59 PM. Always use your most recent med list.          chlorpheniramine-HYDROcodone 10-8 MG/5ML Suer Commonly known as:  TUSSIONEX PENNKINETIC ER Take 5 mLs by mouth at bedtime as needed for cough.   doxycycline 100 MG tablet Commonly known as:  VIBRA-TABS Take 1 tablet (100 mg total) by mouth 2 (two) times daily for 10 days.   fenofibrate 54 MG tablet TAKE ONE TABLET BY MOUTH ONCE DAILY   levothyroxine 125 MCG tablet Commonly known as:  SYNTHROID, LEVOTHROID TAKE 1 TABLET BY MOUTH ONCE DAILY   losartan 25 MG tablet Commonly known as:  COZAAR Take 1 tablet (25 mg total) by mouth daily.   metFORMIN 500 MG tablet Commonly known as:  GLUCOPHAGE TAKE 1 TABLET BY MOUTH TWICE DAILY WITH MEALS   multivitamin,tx-minerals tablet Take 1 tablet by mouth 2 (two) times a week.   OMEGA 3 PO Take 1 capsule by mouth 2 (two) times a week.   vitamin C 500 MG tablet Commonly known as:  ASCORBIC ACID Take 500 mg by mouth daily.   Vitamin D3 2000 units Tabs Take by mouth.   vitamin E 100 UNIT capsule  Take 100 Units by mouth daily.

## 2017-09-15 ENCOUNTER — Encounter: Payer: PPO | Admitting: Family Medicine

## 2017-09-15 ENCOUNTER — Encounter: Payer: Self-pay | Admitting: Family Medicine

## 2017-10-02 ENCOUNTER — Telehealth: Payer: Self-pay | Admitting: Family Medicine

## 2017-10-02 ENCOUNTER — Encounter: Payer: Self-pay | Admitting: Family Medicine

## 2017-10-02 ENCOUNTER — Ambulatory Visit (INDEPENDENT_AMBULATORY_CARE_PROVIDER_SITE_OTHER)
Admission: RE | Admit: 2017-10-02 | Discharge: 2017-10-02 | Disposition: A | Discharge status: Home / Self Care | Payer: PPO | Source: Ambulatory Visit | Attending: Family Medicine | Admitting: Family Medicine

## 2017-10-02 ENCOUNTER — Ambulatory Visit (INDEPENDENT_AMBULATORY_CARE_PROVIDER_SITE_OTHER): Payer: PPO | Admitting: Family Medicine

## 2017-10-02 VITALS — BP 122/66 | HR 83 | Temp 98.0°F | Ht 66.0 in | Wt 185.2 lb

## 2017-10-02 DIAGNOSIS — R05 Cough: Secondary | ICD-10-CM | POA: Diagnosis not present

## 2017-10-02 DIAGNOSIS — R918 Other nonspecific abnormal finding of lung field: Secondary | ICD-10-CM | POA: Diagnosis not present

## 2017-10-02 DIAGNOSIS — J189 Pneumonia, unspecified organism: Secondary | ICD-10-CM

## 2017-10-02 DIAGNOSIS — R059 Cough, unspecified: Secondary | ICD-10-CM

## 2017-10-02 DIAGNOSIS — J181 Lobar pneumonia, unspecified organism: Secondary | ICD-10-CM

## 2017-10-02 MED ORDER — ALBUTEROL SULFATE HFA 108 (90 BASE) MCG/ACT IN AERS: 2.0000 | INHALATION_SPRAY | RESPIRATORY_TRACT | 1 refills | Status: DC | PRN

## 2017-10-02 MED ORDER — IPRATROPIUM-ALBUTEROL 0.5-2.5 (3) MG/3ML IN SOLN
3.0000 mL | Freq: Once | RESPIRATORY_TRACT | Status: AC
  Administered 2017-10-02: 3 mL via RESPIRATORY_TRACT

## 2017-10-02 MED ORDER — ALBUTEROL SULFATE 108 (90 BASE) MCG/ACT IN AEPB: 2.0000 | INHALATION_SPRAY | RESPIRATORY_TRACT | 0 refills | Status: DC | PRN

## 2017-10-02 MED ORDER — LEVOFLOXACIN 750 MG PO TABS: 750.0000 mg | ORAL_TABLET | ORAL | 0 refills | Status: DC

## 2017-10-02 MED ORDER — HYDROCOD POLST-CPM POLST ER 10-8 MG/5ML PO SUER: 5.0000 mL | Freq: Two times a day (BID) | ORAL | 0 refills | Status: DC | PRN

## 2017-10-02 NOTE — Patient Instructions (Addendum)
I will notify you of chest xray report  Please take a daily antihistamine (loratadine, cetirizine, fexofenadine) for at least 2 weeks  Try to suppress cough- can use Delsym over the counter and Tussionex at bedtime or if you don't have to drive.  Also keep liquids and cough drops handy to prevent cough. Avoid activities that trigger cough.   Use inhaler every 4-6 hours today and tomorrow while awake then as needed for cough, wheeze, chest tightness

## 2017-10-02 NOTE — Progress Notes (Signed)
Subjective:    Patient ID: Carla Mccoy, female    DOB: 26-May-1950, 67 y.o.   MRN: [id number]  HPI This is a 67 yo female who presents today with cough off and on x 6 months. She was seen 09/14/17 and treated with Doxycycline 100 mg BID x 10 days and Tussionex. Got better for about 10 days. Cough is worse at night. Cough is dry. Feels like nose runs intermittently with slow drip down her throat. Back and chest hurt with cough. No known fever/chills. Some intermittent headache. Maybe some SOB, not sure if related to heat. No recent wheeze.  No asthma history or inhaler use. Occasionally takes an otc allergy medication. Has been doing a lot of mowing recently. Good relief with Tussionex. No relief with benzonate.    Past Medical History:  Diagnosis Date  . Allergy   . 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 Arthritis   . Backache, unspecified   . breast ca dx'd 06/2009   breast cancer left - lumpectomy  . Diabetes mellitus without complication ([place])   . Full dentures   . Migraine without aura, without mention of intractable migraine without mention of status migrainosus   . Obesity, unspecified   . Other abnormal glucose   . Other and unspecified hyperlipidemia   . Other chronic nonalcoholic liver disease   . Personal history of peptic ulcer disease   . Personal history of radiation therapy   . Rheumatic chorea without mention of heart involvement age 13   syndeham chorea  . Sleep apnea   . Tachy-brady syndrome ([place])   . Unspecified essential hypertension 09/12/13   remote history; no current treatment  . Unspecified hypothyroidism   . Wears glasses    Past Surgical History:  Procedure Laterality Date  . ABDOMINAL HYSTERECTOMY  1988  . BREAST BIOPSY Left    in the 80's - benign tissue  . BREAST LUMPECTOMY Left 2011   Feb '11  . Breast Surgery x 2 Left   . INCISION AND DRAINAGE OF WOUND Left 09/14/2013   Procedure: IRRIGATION AND DEBRIDEMENT LEFT BREAST  ABSCESS;  Surgeon: Marcello Moores A. Cornett, MD;  Location: [facility];  Service: General;  Laterality: Left;  . INCISION AND DRAINAGE OF WOUND Left 12/21/2013   Procedure: DEBRIDEMENT OF LEFT BREAST WOUND WITH PLACEMENT OF A CELL;  Surgeon: Theodoro Kos, DO;  Location: [facility];  Service: Plastics;  Laterality: Left;  . LAPAROSCOPY ABDOMEN DIAGNOSTIC    . TONSILLECTOMY    . TUBAL LIGATION  1971   Family History  Problem Relation Age of Onset  . Peripheral vascular disease Mother   . Coronary artery disease Mother   . Hyperlipidemia Mother   . Heart disease Mother        CAD/fatal MI  . Diabetes Mother   . Stroke Mother   . Diabetes Sister   . Hypothyroidism Sister   . Diabetes Brother   . Hyperlipidemia Brother   . Heart disease Brother   . Hypertrophic cardiomyopathy Brother        congenital  . Colon cancer Neg Hx   . Esophageal cancer Neg Hx   . Pancreatic cancer Neg Hx   . Stomach cancer Neg Hx   . Rectal cancer Neg Hx    Social History   Tobacco Use  . Smoking status: Never Smoker  . Smokeless tobacco: Never Used  Substance Use Topics  . Alcohol use: No  Alcohol/week: 0.0 oz  . Drug use: No      Review of Systems Per HPI    Objective:   Physical Exam  Constitutional: She is oriented to person, place, and time. She appears well-developed and well-nourished.  HENT:  Head: Normocephalic and atraumatic.  Right Ear: External ear normal.  Left Ear: External ear normal.  Nose: Nose normal.  Mouth/Throat: Oropharynx is clear and moist.  Eyes: Conjunctivae are normal.  Neck: Normal range of motion. Neck supple.  Cardiovascular: Normal rate, regular rhythm and normal heart sounds.  Pulmonary/Chest: Effort normal.  Frequent, deep, non productive cough. Mildly decreased breath sounds posteriorly. She was given albuterol/atrovent nebulizer in office with improved air movement, decreased cough and subjective improvement.   Lymphadenopathy:     She has no cervical adenopathy.  Neurological: She is alert and oriented to person, place, and time.  Skin: Skin is warm and dry.  Psychiatric: She has a normal mood and affect. Her behavior is normal. Judgment and thought content normal.  Vitals reviewed.     BP 122/66 (BP Location: Right Arm, Patient Position: Sitting, Cuff Size: Normal)   Pulse 83   Temp 98 F (36.7 C) (Oral)   Ht 5\' 6"  (1.676 m)   Wt 185 lb 4 oz (84 kg)   SpO2 99%   BMI 29.90 kg/m  Wt Readings from Last 3 Encounters:  10/02/17 185 lb 4 oz (84 kg)  09/14/17 186 lb 4 oz (84.5 kg)  05/14/17 186 lb (84.4 kg)       Assessment & Plan:  1. Cough - CXR obtained prior to patient leaving the office. Results returned later in day. Discussed with Dr. Glori Bickers. Will go ahead with additional course of antibiotic, has already had doxy, will use Levaquin and potential for side effects discussed with patient over the telephone.  - ipratropium-albuterol (DUONEB) 0.5-2.5 (3) MG/3ML nebulizer solution 3 mL - DG Chest 2 View; Future - albuterol (PROVENTIL HFA;VENTOLIN HFA) 108 (90 Base) MCG/ACT inhaler; Inhale 2 puffs into the lungs every 4 (four) hours as needed for wheezing or shortness of breath (cough, shortness of breath or wheezing.).  Dispense: 1 Inhaler; Refill: 1  2. Community acquired pneumonia of right lower lobe of lung (HCC) - levofloxacin 750 mg daily x 5 days - RTC/ER precautions reviewed  3. Infiltrate of left lung present on chest x-ray - follow up chest xray in 1 month - levofloxacin (LEVAQUIN) 750 MG tablet; Take 1 tablet (750 mg total) by mouth daily.  Dispense: 5 tablet; Refill: 0 - DG Chest 2 View; Future   Clarene Reamer, FNP-BC  [facility]  10/04/2017 7:09 AM

## 2017-10-02 NOTE — Telephone Encounter (Unsigned)
Copied from [place] [id number]. Topic: Quick Communication - See Telephone Encounter >> Oct 02, 2017  3:46 PM Neva Seat wrote: Pt calling back on chest Xray today.  Needing to know if there is anything she is needing to do w/ taking medication.

## 2017-10-02 NOTE — Telephone Encounter (Signed)
Called patient with results. Levaquin sent to her pharmacy. Discussed potential side effects including tendon rupture.

## 2017-10-04 ENCOUNTER — Encounter: Payer: Self-pay | Admitting: Family Medicine

## 2017-10-18 ENCOUNTER — Telehealth: Payer: Self-pay | Admitting: Family Medicine

## 2017-10-18 DIAGNOSIS — E039 Hypothyroidism, unspecified: Secondary | ICD-10-CM

## 2017-10-18 DIAGNOSIS — E781 Pure hyperglyceridemia: Secondary | ICD-10-CM

## 2017-10-18 DIAGNOSIS — I10 Essential (primary) hypertension: Secondary | ICD-10-CM

## 2017-10-18 DIAGNOSIS — E119 Type 2 diabetes mellitus without complications: Secondary | ICD-10-CM

## 2017-10-18 NOTE — Telephone Encounter (Signed)
-----   Message from Eustace Pen, LPN sent at 10/28/6387  6:47 PM EDT ----- Regarding: Labs 6/17 Lab orders needed. Thank you.  Insurance:  [employer]

## 2017-10-19 ENCOUNTER — Ambulatory Visit (INDEPENDENT_AMBULATORY_CARE_PROVIDER_SITE_OTHER): Payer: PPO

## 2017-10-19 VITALS — BP 118/70 | HR 76 | Temp 98.6°F | Ht 66.0 in | Wt 183.5 lb

## 2017-10-19 DIAGNOSIS — E119 Type 2 diabetes mellitus without complications: Secondary | ICD-10-CM

## 2017-10-19 DIAGNOSIS — E039 Hypothyroidism, unspecified: Secondary | ICD-10-CM | POA: Diagnosis not present

## 2017-10-19 DIAGNOSIS — I10 Essential (primary) hypertension: Secondary | ICD-10-CM

## 2017-10-19 DIAGNOSIS — E781 Pure hyperglyceridemia: Secondary | ICD-10-CM | POA: Diagnosis not present

## 2017-10-19 DIAGNOSIS — Z0000 Encounter for general adult medical examination without abnormal findings: Secondary | ICD-10-CM

## 2017-10-19 LAB — COMPREHENSIVE METABOLIC PANEL
ALBUMIN: 4.1 g/dL (ref 3.5–5.2)
ALT: 15 U/L (ref 0–35)
AST: 18 U/L (ref 0–37)
Alkaline Phosphatase: 32 U/L — ABNORMAL LOW (ref 39–117)
BILIRUBIN TOTAL: 0.4 mg/dL (ref 0.2–1.2)
BUN: 11 mg/dL (ref 6–23)
CALCIUM: 9.6 mg/dL (ref 8.4–10.5)
CO2: 27 meq/L (ref 19–32)
CREATININE: 0.8 mg/dL (ref 0.40–1.20)
Chloride: 106 mEq/L (ref 96–112)
GFR: 75.65 mL/min (ref >60.00)
Glucose, Bld: 106 mg/dL — ABNORMAL HIGH (ref 70–99)
Potassium: 4.4 mEq/L (ref 3.5–5.1)
Sodium: 140 mEq/L (ref 135–145)
TOTAL PROTEIN: 7.1 g/dL (ref 6.0–8.3)

## 2017-10-19 LAB — CBC WITH DIFFERENTIAL/PLATELET
BASOS ABS: 0.1 10*3/uL (ref 0.0–0.1)
Basophils Relative: 0.9 % (ref 0.0–3.0)
EOS ABS: 0.3 10*3/uL (ref 0.0–0.7)
Eosinophils Relative: 4.4 % (ref 0.0–5.0)
HEMATOCRIT: 40.1 % (ref 36.0–46.0)
Hemoglobin: 13.6 g/dL (ref 12.0–15.0)
LYMPHS PCT: 25.6 % (ref 12.0–46.0)
Lymphs Abs: 1.9 10*3/uL (ref 0.7–4.0)
MCHC: 33.8 g/dL (ref 30.0–36.0)
MCV: 90.5 fl (ref 78.0–100.0)
Monocytes Absolute: 0.5 10*3/uL (ref 0.1–1.0)
Monocytes Relative: 6.7 % (ref 3.0–12.0)
NEUTROS PCT: 62.4 % (ref 43.0–77.0)
Neutro Abs: 4.6 10*3/uL (ref 1.4–7.7)
Platelets: 365 10*3/uL (ref 150.0–400.0)
RBC: 4.43 Mil/uL (ref 3.87–5.11)
RDW: 13.3 % (ref 11.5–15.5)
WBC: 7.3 10*3/uL (ref 4.0–10.5)

## 2017-10-19 LAB — LIPID PANEL
CHOLESTEROL: 152 mg/dL (ref 0–200)
HDL: 45 mg/dL (ref >39.00)
LDL Cholesterol: 75 mg/dL (ref 0–99)
NONHDL: 106.56
TRIGLYCERIDES: 156 mg/dL — AB (ref 0.0–149.0)
Total CHOL/HDL Ratio: 3
VLDL: 31.2 mg/dL (ref 0.0–40.0)

## 2017-10-19 LAB — TSH: TSH: 2.21 u[IU]/mL (ref 0.35–4.50)

## 2017-10-19 LAB — HEMOGLOBIN A1C: Hgb A1c MFr Bld: 6.8 % — ABNORMAL HIGH (ref 4.6–6.5)

## 2017-10-19 NOTE — Progress Notes (Signed)
Subjective:   Carla Mccoy is a 67 y.o. female who presents for Medicare Annual (Subsequent) preventive examination.  Review of Systems:  N/A Cardiac Risk Factors include: advanced age (>52men, >67 women);diabetes mellitus;dyslipidemia;hypertension     Objective:     Vitals: BP 118/70 (BP Location: Right Arm, Patient Position: Sitting, Cuff Size: Normal)   Pulse 76   Temp 98.6 F (37 C) (Oral)   Ht 5\' 6"  (1.676 m) Comment: no shoes  Wt 183 lb 8 oz (83.2 kg)   SpO2 97%   BMI 29.62 kg/m   Body mass index is 29.62 kg/m.  Advanced Directives 10/19/2017 06/26/2016 09/11/2015 12/19/2013 09/12/2013  Does Patient Have a Medical Advance Directive? No No No No Patient would like information  Copy of [place] in Chart? - - - No - copy requested -  Would patient like information on creating a medical advance directive? No - Patient declined No - Patient declined Yes - Scientist, clinical (histocompatibility and immunogenetics) given No - patient declined information Advance directive packet given    Tobacco Social History   Tobacco Use  Smoking Status Never Smoker  Smokeless Tobacco Never Used     Counseling given: No   Clinical Intake:  Pre-visit preparation completed: Yes  Pain Score: 3      Nutritional Status: BMI 25 -29 Overweight Nutritional Risks: None Diabetes: No  How often do you need to have someone help you when you read instructions, pamphlets, or other written materials from your doctor or pharmacy?: 1 - Never What is the last grade level you completed in school?: 12th grade  Interpreter Needed?: No  Comments: pt lives with spouse Information entered by :: LPinson, LPN  Past Medical History:  Diagnosis Date  . Allergy   . 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 Arthritis   . Backache, unspecified   . breast ca dx'd 06/2009   breast cancer left - lumpectomy  . Diabetes mellitus without complication ([place])   . Full dentures   . Migraine without aura,  without mention of intractable migraine without mention of status migrainosus   . Obesity, unspecified   . Other abnormal glucose   . Other and unspecified hyperlipidemia   . Other chronic nonalcoholic liver disease   . Personal history of peptic ulcer disease   . Personal history of radiation therapy   . 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 Sleep apnea   . Tachy-brady syndrome ([place])   . Unspecified essential hypertension 09/12/13   remote history; no current treatment  . Unspecified hypothyroidism   . Wears glasses    Past Surgical History:  Procedure Laterality Date  . ABDOMINAL HYSTERECTOMY  1988  . BREAST BIOPSY Left    in the 80's - benign tissue  . BREAST LUMPECTOMY Left 2011   Feb '11  . Breast Surgery x 2 Left   . INCISION AND DRAINAGE OF WOUND Left 09/14/2013   Procedure: IRRIGATION AND DEBRIDEMENT LEFT BREAST ABSCESS;  Surgeon: Marcello Moores A. Cornett, MD;  Location: [facility];  Service: General;  Laterality: Left;  . INCISION AND DRAINAGE OF WOUND Left 12/21/2013   Procedure: DEBRIDEMENT OF LEFT BREAST WOUND WITH PLACEMENT OF A CELL;  Surgeon: Theodoro Kos, DO;  Location: [facility];  Service: Plastics;  Laterality: Left;  . LAPAROSCOPY ABDOMEN DIAGNOSTIC    . TONSILLECTOMY    . [place]  History  Problem Relation Age of Onset  . Peripheral vascular disease Mother   . Coronary artery disease Mother   . Hyperlipidemia Mother   . Heart disease Mother        CAD/fatal MI  . Diabetes Mother   . Stroke Mother   . Diabetes Sister   . Hypothyroidism Sister   . Diabetes Brother   . Hyperlipidemia Brother   . Heart disease Brother   . Hypertrophic cardiomyopathy Brother        congenital  . Colon cancer Neg Hx   . Esophageal cancer Neg Hx   . Pancreatic cancer Neg Hx   . Stomach cancer Neg Hx   . Rectal cancer Neg Hx    Social History   Socioeconomic History  . Marital  status: Married    Spouse name: Not on file  . Number of children: Not on file  . Years of education: 32  . Highest education level: Not on file  Occupational History  . Occupation: Cabin crew: [employer]  . Financial resource strain: Not on file  . Food insecurity:    Worry: Not on file    Inability: Not on file  . Transportation needs:    Medical: Not on file    Non-medical: Not on file  Tobacco Use  . Smoking status: Never Smoker  . Smokeless tobacco: Never Used  Substance and Sexual Activity  . Alcohol use: No    Alcohol/week: 0.0 oz  . Drug use: No  . Sexual activity: Yes    Partners: Male  Lifestyle  . Physical activity:    Days per week: Not on file    Minutes per session: Not on file  . Stress: Not on file  Relationships  . Social connections:    Talks on phone: Not on file    Gets together: Not on file    Attends religious service: Not on file    Active member of club or organization: Not on file    Attends meetings of clubs or organizations: Not on file    Relationship status: Not on file  Other Topics Concern  . Not on file  Social History Narrative   HSG, [employer] - 3 yrs, no degree. Married -'73  1 son -'16, 1 daughter-'70; 2 grandchildren.  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  Marriage is in good health. She enjoys her work.                         Outpatient Encounter Medications as of 10/19/2017  Medication Sig  . Cholecalciferol (VITAMIN D3) 2000 UNITS TABS Take by mouth.    . fenofibrate 54 MG tablet TAKE ONE TABLET BY MOUTH ONCE DAILY  . levothyroxine (SYNTHROID, LEVOTHROID) 125 MCG tablet TAKE 1 TABLET BY MOUTH ONCE DAILY  . losartan (COZAAR) 25 MG tablet Take 1 tablet (25 mg total) by mouth daily.  . metFORMIN (GLUCOPHAGE) 500 MG tablet TAKE 1 TABLET BY MOUTH TWICE DAILY WITH MEALS  . Multiple Vitamins-Minerals (MULTIVITAMIN,TX-MINERALS) tablet Take 1 tablet by mouth 2 (two) times a week.   .  Omega-3 Fatty Acids (OMEGA 3 PO) Take 1 capsule by mouth 2 (two) times a week.   . vitamin C (ASCORBIC ACID) 500 MG tablet Take 500 mg by mouth daily.  . vitamin E 100 UNIT capsule Take 100 Units by mouth daily.   . [DISCONTINUED] levofloxacin (LEVAQUIN) 750 MG tablet  Take 1 tablet (750 mg total) by mouth daily.  [name] albuterol (PROVENTIL HFA;VENTOLIN HFA) 108 (90 Base) MCG/ACT inhaler Inhale 2 puffs into the lungs every 4 (four) hours as needed for wheezing or shortness of breath (cough, shortness of breath or wheezing.). (Patient not taking: Reported on 10/19/2017)  . chlorpheniramine-HYDROcodone (TUSSIONEX PENNKINETIC ER) 10-8 MG/5ML SUER Take 5 mLs by mouth every 12 (twelve) hours as needed for up to 14 doses for cough. (Patient not taking: Reported on 10/19/2017)   Facility-Administered Encounter Medications as of 10/19/2017  Medication  . 0.9 %  sodium chloride infusion    Activities of Daily Living In your present state of health, do you have any difficulty performing the following activities: 10/19/2017  Hearing? N  Vision? N  Difficulty concentrating or making decisions? N  Walking or climbing stairs? N  Dressing or bathing? N  Doing errands, shopping? N  Preparing Food and eating ? N  Using the Toilet? N  In the past six months, have you accidently leaked urine? N  Do you have problems with loss of bowel control? N  Managing your Medications? N  Managing your Finances? N  Housekeeping or managing your Housekeeping? N  Some recent data might be hidden    Patient Care Team: [name], [name], MD as PCP - General (Family Medicine) Romine, Lubertha South, MD (Obstetrics and Gynecology) Verl Blalock, Marijo Conception, MD (Inactive) (Cardiology) Love, Alyson Locket, MD (Neurology) Sable Feil, MD (Gastroenterology) Magrinat, Virgie Dad, MD (Hematology and Oncology) Erroll Luna, MD (General Surgery) Sharol Roussel, [place] (Optometry) Clent Jacks, MD (Ophthalmology)    Assessment:   This is a routine  wellness examination for [place].   Hearing Screening   125Hz  250Hz  500Hz  1000Hz  2000Hz  3000Hz  4000Hz  6000Hz  8000Hz   Right ear:   0 0 40  40    Left ear:   40 40 40  40    Vision Screening Comments: Oct 2018 with Dr. Katy Fitch   Exercise Activities and Dietary recommendations Current Exercise Habits: Home exercise routine, Type of exercise: walking, Time (Minutes): 30, Frequency (Times/Week): 7, Weekly Exercise (Minutes/Week): 210, Intensity: Mild, Exercise limited by: None identified  Goals    . Patient Stated     Starting 10/19/2017, I will continue to take medications as prescribed.         Fall Risk Fall Risk  10/19/2017 06/26/2016 10/02/2015 09/18/2015 09/11/2015  Falls in the past year? No No No No No   Depression Screen PHQ 2/9 Scores 10/19/2017 06/26/2016 10/02/2015 09/18/2015  PHQ - 2 Score 0 0 0 0  PHQ- 9 Score 0 - - -     Cognitive Function MMSE - Mini Mental State Exam 10/19/2017 06/26/2016  Orientation to time 5 5  Orientation to Place 5 5  Registration 3 3  Attention/ Calculation 0 0  Recall 3 3  Language- name 2 objects 0 0  Language- repeat 1 1  Language- follow 3 step command 3 3  Language- read & follow direction 0 0  Write a sentence 0 0  Copy design 0 0  Total score 20 20     PLEASE NOTE: A Mini-Cog screen was completed. Maximum score is 20. A value of 0 denotes this part of [place] MMSE was not completed or the patient failed this part of the Mini-Cog screening.   Mini-Cog Screening Orientation to Time - Max 5 pts Orientation to Place - Max 5 pts Registration - Max 3 pts Recall - Max 3 pts Language Repeat - Max  1 pts Language Follow 3 Step Command - Max 3 pts     Immunization History  Administered Date(s) Administered  . H1N1 03/06/2008  . Influenza Whole 03/06/2008  . Influenza, High Dose Seasonal PF 03/28/2016  . Influenza,inj,Quad PF,6+ Mos 03/24/2014, 03/26/2015, 03/10/2017  . Influenza-Unspecified 02/02/2013  . Pneumococcal Conjugate-13  03/22/2013  . Pneumococcal Polysaccharide-23 09/02/2011, 03/10/2017  . Td 08/07/2008    Screening Tests Health Maintenance  Topic Date Due  . INFLUENZA VACCINE  12/03/2017  . OPHTHALMOLOGY EXAM  02/26/2018  . FOOT EXAM  03/10/2018  . HEMOGLOBIN A1C  04/20/2018  . TETANUS/TDAP  08/08/2018  . MAMMOGRAM  02/11/2019  . COLONOSCOPY  05/15/2027  . DEXA SCAN  Completed  . Hepatitis C Screening  Completed  . PNA vac Low Risk Adult  Completed       Plan:     I have personally reviewed, addressed, and noted the following in the patient's chart:  A. Medical and social history B. Use of alcohol, tobacco or illicit drugs  C. Current medications and supplements D. Functional ability and status E.  Nutritional status F.  Physical activity G. Advance directives H. List of other physicians I.  Hospitalizations, surgeries, and ER visits in previous 12 months J.  [place] to include hearing, vision, cognitive, depression L. Referrals and appointments - none  In addition, I have reviewed and discussed with patient certain preventive protocols, quality metrics, and best practice recommendations. A written personalized care plan for preventive services as well as general preventive health recommendations were provided to patient.  See attached scanned questionnaire for additional information.   Signed,   Lindell Noe, MHA, BS, LPN Health Coach

## 2017-10-19 NOTE — Patient Instructions (Signed)
Carla Mccoy , Thank you for taking time to come for your Medicare Wellness Visit. I appreciate your ongoing commitment to your health goals. Please review the following plan we discussed and let me know if I can assist you in the future.   These are the goals we discussed: Goals    . Patient Stated     Starting 10/19/2017, I will continue to take medications as prescribed.         This is a list of the screening recommended for you and due dates:  Health Maintenance  Topic Date Due  . Flu Shot  12/03/2017  . Eye exam for diabetics  02/26/2018  . Complete foot exam   03/10/2018  . Hemoglobin A1C  04/20/2018  . Tetanus Vaccine  08/08/2018  . Mammogram  02/11/2019  . Colon Cancer Screening  05/15/2027  . DEXA scan (bone density measurement)  Completed  .  Hepatitis C: One time screening is recommended by Center for Disease Control  (CDC) for  adults born from [birth year] through 1967.   Completed  . Pneumonia vaccines  Completed   Preventive Care for Adults  A healthy lifestyle and preventive care can promote health and wellness. Preventive health guidelines for adults include the following key practices.  . A routine yearly physical is a good way to check with your health care provider about your health and preventive screening. It is a chance to share any concerns and updates on your health and to receive a thorough exam.  . Visit your dentist for a routine exam and preventive care every 6 months. Brush your teeth twice a day and floss once a day. Good oral hygiene prevents tooth decay and gum disease.  . The frequency of eye exams is based on your age, health, family medical history, use  of contact lenses, and other factors. Follow your health care provider's recommendations for frequency of eye exams.  . Eat a healthy diet. Foods like vegetables, fruits, whole grains, low-fat dairy products, and lean protein foods contain the nutrients you need without too many calories. Decrease your  intake of foods high in solid fats, added sugars, and salt. Eat the right amount of calories for you. Get information about a proper diet from your health care provider, if necessary.  . Regular physical exercise is one of the most important things you can do for your health. Most adults should get at least 150 minutes of moderate-intensity exercise (any activity that increases your heart rate and causes you to sweat) each week. In addition, most adults need muscle-strengthening exercises on 2 or more days a week.  Silver Sneakers may be a benefit available to you. To determine eligibility, you may visit the website: www.silversneakers.com or contact program at 980-138-3521 Mon-Fri between 8AM-8PM.   . Maintain a healthy weight. The body mass index (BMI) is a screening tool to identify possible weight problems. It provides an estimate of body fat based on height and weight. Your health care provider can find your BMI and can help you achieve or maintain a healthy weight.   For adults 20 years and older: ? A BMI below 18.5 is considered underweight. ? A BMI of 18.5 to 24.9 is normal. ? A BMI of 25 to 29.9 is considered overweight. ? A BMI of 30 and above is considered obese.   . Maintain normal blood lipids and cholesterol levels by exercising and minimizing your intake of saturated fat. Eat a balanced diet with plenty of fruit and  vegetables. Blood tests for lipids and cholesterol should begin at age 32 and be repeated every 5 years. If your lipid or cholesterol levels are high, you are over 50, or you are at high risk for heart disease, you may need your cholesterol levels checked more frequently. Ongoing high lipid and cholesterol levels should be treated with medicines if diet and exercise are not working.  . If you smoke, find out from your health care provider how to quit. If you do not use tobacco, please do not start.  . If you choose to drink alcohol, please do not consume more than 2  drinks per day. One drink is considered to be 12 ounces (355 mL) of beer, 5 ounces (148 mL) of wine, or 1.5 ounces (44 mL) of liquor.  . If you are 47-89 years old, ask your health care provider if you should take aspirin to prevent strokes.  . Use sunscreen. Apply sunscreen liberally and repeatedly throughout the day. You should seek shade when your shadow is shorter than you. Protect yourself by wearing long sleeves, pants, a wide-brimmed hat, and sunglasses year round, whenever you are outdoors.  . Once a month, do a whole body skin exam, using a mirror to look at the skin on your back. Tell your health care provider of new moles, moles that have irregular borders, moles that are larger than a pencil eraser, or moles that have changed in shape or color.

## 2017-10-19 NOTE — Progress Notes (Signed)
PCP notes:   Health maintenance:  A1C - completed  Abnormal screenings:   Hearing - failed  Hearing Screening   125Hz  250Hz  500Hz  1000Hz  2000Hz  3000Hz  4000Hz  6000Hz  8000Hz   Right ear:   0 0 40  40    Left ear:   40 40 40  40     Patient concerns:   Patient verbalized concerns over red spots on both feet.   Nurse concerns:  None  Next PCP appt:   11/03/17 @ 1200  I reviewed health advisor's note, was available for consultation, and agree with documentation and plan. Loura Pardon MD

## 2017-10-30 ENCOUNTER — Ambulatory Visit: Payer: PPO

## 2017-11-03 ENCOUNTER — Other Ambulatory Visit: Payer: Self-pay | Admitting: Family Medicine

## 2017-11-03 ENCOUNTER — Encounter: Payer: Self-pay | Admitting: Family Medicine

## 2017-11-03 ENCOUNTER — Ambulatory Visit (INDEPENDENT_AMBULATORY_CARE_PROVIDER_SITE_OTHER): Payer: PPO | Admitting: Family Medicine

## 2017-11-03 VITALS — BP 128/60 | HR 74 | Temp 98.1°F | Ht 66.0 in | Wt 181.0 lb

## 2017-11-03 DIAGNOSIS — I10 Essential (primary) hypertension: Secondary | ICD-10-CM | POA: Diagnosis not present

## 2017-11-03 DIAGNOSIS — C50912 Malignant neoplasm of unspecified site of left female breast: Secondary | ICD-10-CM

## 2017-11-03 DIAGNOSIS — Z0000 Encounter for general adult medical examination without abnormal findings: Secondary | ICD-10-CM

## 2017-11-03 DIAGNOSIS — E781 Pure hyperglyceridemia: Secondary | ICD-10-CM

## 2017-11-03 DIAGNOSIS — M8580 Other specified disorders of bone density and structure, unspecified site: Secondary | ICD-10-CM | POA: Diagnosis not present

## 2017-11-03 DIAGNOSIS — E119 Type 2 diabetes mellitus without complications: Secondary | ICD-10-CM | POA: Diagnosis not present

## 2017-11-03 DIAGNOSIS — E2839 Other primary ovarian failure: Secondary | ICD-10-CM | POA: Diagnosis not present

## 2017-11-03 DIAGNOSIS — E039 Hypothyroidism, unspecified: Secondary | ICD-10-CM

## 2017-11-03 DIAGNOSIS — Z1231 Encounter for screening mammogram for malignant neoplasm of breast: Secondary | ICD-10-CM

## 2017-11-03 MED ORDER — FENOFIBRATE 54 MG PO TABS: 54.0000 mg | ORAL_TABLET | Freq: Every day | ORAL | 3 refills | Status: DC

## 2017-11-03 MED ORDER — METFORMIN HCL 500 MG PO TABS: 500.0000 mg | ORAL_TABLET | Freq: Two times a day (BID) | ORAL | 3 refills | Status: DC

## 2017-11-03 MED ORDER — LEVOTHYROXINE SODIUM 125 MCG PO TABS: 125.0000 ug | ORAL_TABLET | Freq: Every day | ORAL | 3 refills | Status: DC

## 2017-11-03 MED ORDER — LOSARTAN POTASSIUM 25 MG PO TABS: 25.0000 mg | ORAL_TABLET | Freq: Every day | ORAL | 3 refills | Status: DC

## 2017-11-03 NOTE — Patient Instructions (Addendum)
If you are interested in the new shingles vaccine (Shingrix) - call your local pharmacy to check on coverage and availability  If covered - get on wait list at your pharmacy     for diabetes Try to get most of your carbohydrates from produce (with the exception of white potatoes)  Eat less bread/pasta/rice/snack foods/cereals/sweets and other items from the middle of the grocery store (processed carbs)   Stop at check out to get bone density test scheduled

## 2017-11-03 NOTE — Assessment & Plan Note (Signed)
Hypothyroidism  Pt has no clinical changes No change in energy level/ hair or skin/ edema and no tremor Lab Results  Component Value Date   TSH 2.21 10/19/2017

## 2017-11-03 NOTE — Progress Notes (Signed)
Subjective:    Patient ID: Carla Mccoy, female    DOB: [birth year], 67 y.o.   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     Wt Readings from Last 3 Encounters:  11/03/17 181 lb (82.1 kg)  10/19/17 183 lb 8 oz (83.2 kg)  10/02/17 185 lb 4 oz (84 kg)  wt is down a few lb  Getting exercise - walking and doing aerobics program  Also stays active  Diet - doing well most of the time  29.21 kg/m   Had amw 6/17 Missed low hearing tones R ear - does not want to pursue it  No other concerns  Mammogram 10/18 nl (s/p lumpectomy) H/o breast cancer L with almost 5 y of tamoxifen  Self breast exam - no new lumps or changes  Aug of last year- her wound finally closed / doing much better   Colonoscopy 1/19 -diverticulosis and int hemorrhoids  10 year recall   dexa 4/14 with osteopenia of the LS  Taking vit D daily  Exercises No falls or fractures   Zoster status-had shingles in the past  Is interested in shingrix    bp is up on first check today-unusual for her  She watches at home - 122/70 yesterday  No cp or palpitations or headaches or edema  No side effects to medicines  BP Readings from Last 3 Encounters:  11/03/17 (!) 144/78  10/19/17 118/70  10/02/17 122/66     BP: 128/60  Better on re check    Lab Results  Component Value Date   CREATININE 0.80 10/19/2017   BUN 11 10/19/2017   NA 140 10/19/2017   K 4.4 10/19/2017   CL 106 10/19/2017   CO2 27 10/19/2017   Lab Results  Component Value Date   ALT 15 10/19/2017   AST 18 10/19/2017     ALKPHOS 32 (L) 10/19/2017   BILITOT 0.4 10/19/2017    DM2 Lab Results  Component Value Date   HGBA1C 6.8 (H) 10/19/2017   This is up from 6.6 When she was sick/coughing - she ate a lot /this may have been  Does eat sugar cereal  Takes metformin  Up to date eye exam and foot care  She has done DM teaching     Hypothyroidism  Pt has no clinical changes No change in energy level/ hair or skin/ edema and no tremor Lab Results  Component Value Date   TSH 2.21 10/19/2017     hypertrig  Lab Results  Component Value Date   CHOL 152 10/19/2017   CHOL 158 03/09/2017   CHOL 164 11/12/2016   Lab Results  Component Value Date   HDL 45.00 10/19/2017   HDL 40.00 03/09/2017   HDL 43.90 11/12/2016   Lab Results  Component Value Date   LDLCALC 75 10/19/2017   [place] 82 03/09/2017   [place] 81 11/12/2016   Lab Results  Component Value Date  TRIG 156.0 (H) 10/19/2017   TRIG 178.0 (H) 03/09/2017   TRIG 200.0 (H) 11/12/2016   Lab Results  Component Value Date   CHOLHDL 3 10/19/2017   CHOLHDL 4 03/09/2017   CHOLHDL 4 11/12/2016   Lab Results  Component Value Date   LDLDIRECT 70.0 05/07/2016   LDLDIRECT 85.0 11/02/2015   LDLDIRECT 93.0 03/19/2015  takes fenofibrate 54 mg    Patient Active Problem List   Diagnosis Date Noted  . Estrogen deficiency 11/03/2017  . Osteopenia 11/03/2017  . Leg cramps 11/18/2016  . Family history of congenital heart disease 11/18/2016  . Arm pain, chronic 11/18/2016  . Cough due to ACE inhibitor 11/18/2016  . Routine general medical examination at a health care facility 03/26/2015  . Colon cancer screening 03/24/2014  . Encounter for Medicare annual wellness exam 03/24/2014  . Diabetes type 2, controlled ([place]) 03/17/2014  . Trigger finger, acquired 08/26/2013  . Benign paroxysmal positional vertigo 08/26/2013  . OSA (obstructive sleep apnea) 08/10/2013  . Chronic mastitis of left breast 07/04/2013  . Breast cancer ([place])  02/10/2012  . 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 BACK PAIN, CHRONIC 05/10/2007  . PEPTIC ULCER DISEASE, HX OF 05/10/2007  . HYSTERECTOMY, HX OF 05/10/2007  . TONSILLECTOMY, HX OF 05/10/2007  . Hypothyroidism 01/28/2007  . Hypertriglyceridemia 01/28/2007  . Overweight 01/28/2007  . COMMON MIGRAINE 01/28/2007  . Essential hypertension 01/28/2007  . FATTY LIVER DISEASE 01/28/2007  . CARPAL TUNNEL SYNDROME, HX OF 01/27/2007   Past Medical History:  Diagnosis Date  . Allergy   . 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 Arthritis   . Backache, unspecified   . breast ca dx'd 06/2009   breast cancer left - lumpectomy  . Diabetes mellitus without complication ([place])   . Full dentures   . Migraine without aura, without mention of intractable migraine without mention of status migrainosus   . Obesity, unspecified   . Other abnormal glucose   . Other and unspecified hyperlipidemia   . Other chronic nonalcoholic liver disease   . Personal history of peptic ulcer disease   . Personal history of radiation therapy   . 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 Sleep apnea   . Tachy-brady syndrome ([place])   . Unspecified essential hypertension 09/12/13   remote history; no current treatment  . Unspecified hypothyroidism   . Wears glasses    Past Surgical History:  Procedure Laterality Date  . ABDOMINAL HYSTERECTOMY  1988  . BREAST BIOPSY Left    in the 80's - benign tissue  . BREAST LUMPECTOMY Left 2011   Feb '11  . Breast Surgery x 2 Left   . INCISION AND DRAINAGE OF WOUND Left 09/14/2013   Procedure: IRRIGATION AND DEBRIDEMENT LEFT BREAST ABSCESS;  Surgeon: Marcello Moores A. Cornett, MD;  Location: [facility];  Service: General;  Laterality: Left;  . INCISION AND DRAINAGE OF WOUND Left 12/21/2013   Procedure: DEBRIDEMENT OF LEFT BREAST WOUND WITH PLACEMENT OF A CELL;  Surgeon: Theodoro Kos, DO;  Location: [facility];  Service: Plastics;  Laterality: Left;  . LAPAROSCOPY ABDOMEN DIAGNOSTIC    . TONSILLECTOMY    . TUBAL LIGATION  1971   Social History   Tobacco Use  . Smoking status: Never Smoker  . Smokeless tobacco: Never Used  Substance Use Topics  . Alcohol use: No    Alcohol/week: 0.0 oz  . Drug use: No  Family History  Problem Relation Age of Onset  . Peripheral vascular disease Mother   . Coronary artery disease Mother   . Hyperlipidemia Mother   . Heart disease Mother        CAD/fatal MI  . Diabetes Mother   . Stroke Mother   . Diabetes Sister   . Hypothyroidism Sister   . Diabetes Brother   . Hyperlipidemia Brother   . Heart disease Brother   . Hypertrophic cardiomyopathy Brother        congenital  . Colon cancer Neg Hx   . Esophageal cancer Neg Hx   . Pancreatic cancer Neg Hx   . Stomach cancer Neg Hx   . Rectal cancer Neg Hx    Allergies  Allergen Reactions  . Metoprolol     Lips swelled/ hands swelled and her skin turned red   . Sulfonamide Derivatives    Current Outpatient Medications on File Prior to Visit  Medication Sig Dispense Refill  . albuterol (PROVENTIL HFA;VENTOLIN HFA) 108 (90 Base) MCG/ACT inhaler Inhale 2 puffs into the lungs every 4 (four) hours as needed for wheezing or shortness of breath (cough, shortness of breath or wheezing.). 1 Inhaler 1  . Cholecalciferol (VITAMIN D3) 2000 UNITS TABS Take by mouth.      . Multiple Vitamins-Minerals (MULTIVITAMIN,TX-MINERALS) tablet Take 1 tablet by mouth 2 (two) times a week.     . Omega-3 Fatty Acids (OMEGA 3 PO) Take 1 capsule by mouth 2 (two) times a week.     . vitamin C (ASCORBIC ACID) 500 MG tablet Take 500 mg by mouth daily.    . vitamin E 100 UNIT capsule Take 100 Units by mouth daily.      No current facility-administered medications on file prior to visit.     Review of Systems  Constitutional: Negative for activity change, appetite change, fatigue, fever and unexpected weight change.   HENT: Negative for congestion, ear pain, rhinorrhea, sinus pressure and sore throat.   Eyes: Negative for pain, redness and visual disturbance.  Respiratory: Positive for cough. Negative for shortness of breath and wheezing.   Cardiovascular: Negative for chest pain and palpitations.  Gastrointestinal: Negative for abdominal pain, blood in stool, constipation and diarrhea.  Endocrine: Negative for polydipsia and polyuria.  Genitourinary: Negative for dysuria, frequency and urgency.  Musculoskeletal: Negative for arthralgias, back pain and myalgias.  Skin: Negative for pallor and rash.  Allergic/Immunologic: Negative for environmental allergies.  Neurological: Negative for dizziness, syncope and headaches.       Feeling of head/neck popping   Hematological: Negative for adenopathy. Does not bruise/bleed easily.  Psychiatric/Behavioral: Negative for decreased concentration and dysphoric mood. The patient is not nervous/anxious.        Objective:   Physical Exam  Constitutional: She appears well-developed and well-nourished. No distress.  overwt and well app   HENT:  Head: Normocephalic and atraumatic.  Right Ear: External ear normal.  Left Ear: External ear normal.  Mouth/Throat: Oropharynx is clear and moist.  Eyes: Pupils are equal, round, and reactive to light. Conjunctivae and EOM are normal. No scleral icterus.  Neck: Normal range of motion. Neck supple. No JVD present. Carotid bruit is not present. No thyromegaly present.  Cardiovascular: Normal rate, regular rhythm, normal heart sounds and intact distal pulses. Exam reveals no gallop.  Pulmonary/Chest: Effort normal and breath sounds normal. No respiratory distress. She has no wheezes. She exhibits no tenderness. No breast tenderness, discharge or bleeding.  Abdominal: Soft. Bowel sounds are  normal. She exhibits no distension, no abdominal bruit and no mass. There is no tenderness.  Genitourinary: No breast tenderness, discharge  or bleeding.  Genitourinary Comments: Breast exam: No mass, nodules, thickening, tenderness, bulging, retraction, inflamation, nipple discharge or skin changes noted.  No axillary or clavicular LA.     Post surgical changes baseline on L with no open wounds  Nipple retraction bilat  Musculoskeletal: Normal range of motion. She exhibits no edema or tenderness.  Lymphadenopathy:    She has no cervical adenopathy.  Neurological: She is alert. She has normal reflexes. No cranial nerve deficit. She exhibits normal muscle tone. Coordination normal.  Skin: Skin is warm and dry. No rash noted. No erythema. No pallor.  Solar lentigines diffusely  Some red discoloration around ankles in dry areas   Some dry skin/patches on back   Psychiatric: She has a normal mood and affect.  Pleasant           Assessment & Plan:   Problem List Items Addressed This Visit      Cardiovascular and Mediastinum   Essential hypertension    bp in fair control at this time  BP Readings from Last 1 Encounters:  11/03/17 128/60   No changes needed Most recent labs reviewed  Disc lifstyle change with low sodium diet and exercise        Relevant Medications   fenofibrate 54 MG tablet   losartan (COZAAR) 25 MG tablet     Endocrine   Diabetes type 2, controlled ([place])    Lab Results  Component Value Date   HGBA1C 6.8 (H) 10/19/2017   Up a bit  Enc to watch carbs in diet more carefully  Continue metformin as well as eye and foot care  F/u 6 mo       Relevant Medications   losartan (COZAAR) 25 MG tablet   metFORMIN (GLUCOPHAGE) 500 MG tablet   Hypothyroidism    Hypothyroidism  Pt has no clinical changes No change in energy level/ hair or skin/ edema and no tremor Lab Results  Component Value Date   TSH 2.21 10/19/2017          Relevant Medications   levothyroxine (SYNTHROID, LEVOTHROID) 125 MCG tablet     Musculoskeletal and Integument   Osteopenia    LS 2014 Re check dexa this  year Disc need for calcium/ vitamin D/ wt bearing exercise and bone density test every 2 y to monitor Disc safety/ fracture risk in detail          Other   Breast cancer ([place])    Continues screening  Cancer free  Has had chronic mastitis / abscess that are now resolved  Had 3 y of tamoxifen       Estrogen deficiency   Relevant Orders   DG Bone Density   Hypertriglyceridemia    Disc goals for lipids and reasons to control them Rev last labs with pt Rev low sat fat diet in detail  Improved with fenofibrate and diet       Relevant Medications   fenofibrate 54 MG tablet   losartan (COZAAR) 25 MG tablet   Routine general medical examination at a health care facility - Primary    Reviewed health habits including diet and exercise and skin cancer prevention Reviewed appropriate screening tests for age  Also reviewed health mt list, fam hx and immunization status , as well as social and family history   See HPI Labs rev  amw rev - aware  of hearing loss  dexa ordered for osteopenia  Disc shingrix vaccine

## 2017-11-03 NOTE — Assessment & Plan Note (Signed)
bp in fair control at this time  BP Readings from Last 1 Encounters:  11/03/17 128/60   No changes needed Most recent labs reviewed  Disc lifstyle change with low sodium diet and exercise

## 2017-11-03 NOTE — Assessment & Plan Note (Signed)
LS 2014 Re check dexa this year Disc need for calcium/ vitamin D/ wt bearing exercise and bone density test every 2 y to monitor Disc safety/ fracture risk in detail

## 2017-11-03 NOTE — Assessment & Plan Note (Signed)
Lab Results  Component Value Date   HGBA1C 6.8 (H) 10/19/2017   Up a bit  Enc to watch carbs in diet more carefully  Continue metformin as well as eye and foot care  F/u 6 mo

## 2017-11-03 NOTE — Assessment & Plan Note (Signed)
Continues screening  Cancer free  Has had chronic mastitis / abscess that are now resolved  Had 3 y of tamoxifen

## 2017-11-03 NOTE — Assessment & Plan Note (Signed)
Reviewed health habits including diet and exercise and skin cancer prevention Reviewed appropriate screening tests for age  Also reviewed health mt list, fam hx and immunization status , as well as social and family history   See HPI Labs rev  amw rev - aware of hearing loss  dexa ordered for osteopenia  Disc shingrix vaccine

## 2017-11-03 NOTE — Assessment & Plan Note (Signed)
Disc goals for lipids and reasons to control them Rev last labs with pt Rev low sat fat diet in detail  Improved with fenofibrate and diet

## 2017-11-10 ENCOUNTER — Ambulatory Visit: Payer: PPO

## 2017-12-07 DIAGNOSIS — M65331 Trigger finger, right middle finger: Secondary | ICD-10-CM | POA: Diagnosis not present

## 2017-12-12 ENCOUNTER — Encounter: Payer: Self-pay | Admitting: Family Medicine

## 2017-12-12 LAB — HM DIABETES EYE EXAM

## 2017-12-31 ENCOUNTER — Encounter: Payer: Self-pay | Admitting: Family Medicine

## 2018-01-07 DIAGNOSIS — M65331 Trigger finger, right middle finger: Secondary | ICD-10-CM | POA: Diagnosis not present

## 2018-01-07 DIAGNOSIS — M65312 Trigger thumb, left thumb: Secondary | ICD-10-CM | POA: Diagnosis not present

## 2018-02-03 ENCOUNTER — Ambulatory Visit (INDEPENDENT_AMBULATORY_CARE_PROVIDER_SITE_OTHER): Payer: PPO | Admitting: Family Medicine

## 2018-02-03 ENCOUNTER — Encounter: Payer: Self-pay | Admitting: Family Medicine

## 2018-02-03 VITALS — BP 120/66 | HR 81 | Temp 97.9°F | Ht 66.0 in | Wt 182.0 lb

## 2018-02-03 DIAGNOSIS — S29011A Strain of muscle and tendon of front wall of thorax, initial encounter: Secondary | ICD-10-CM | POA: Diagnosis not present

## 2018-02-03 NOTE — Progress Notes (Signed)
Dr. Karleen Hampshire T. Dharma Pare, MD, CAQ [facility] [address] [place], [address] Phone: [phone] Fax: [phone]  02/03/2018  Patient: Carla Mccoy, MRN: [id number], DOB: 08-Oct-1950, 67 y.o.  Primary Physician:  [name], Audrie Gallus, MD   Chief Complaint  Patient presents with  . Pain Under Right Breast/Rib    Feels like a catch  . Cough    mostly at night   Subjective:   Carla Mccoy is a 67 y.o. very pleasant female patient who presents with the following:  Pain under her R breast and pain on the R side. Hurts with coughing. Has been doing some heat. Still hurting.  Remembers turning with a lawnmower. Has 6 acres. Then 2 days later she got some significant pain in R lower ribs.  Past Medical History, Surgical History, Social History, Family History, Problem List, Medications, and Allergies have been reviewed and updated if relevant.  Patient Active Problem List   Diagnosis Date Noted  . Estrogen deficiency 11/03/2017  . Osteopenia 11/03/2017  . Leg cramps 11/18/2016  . Family history of congenital heart disease 11/18/2016  . Arm pain, chronic 11/18/2016  . Cough due to ACE inhibitor 11/18/2016  . Routine general medical examination at a health care facility 03/26/2015  . Colon cancer screening 03/24/2014  . Encounter for Medicare annual wellness exam 03/24/2014  . Diabetes type 2, controlled ([facility]) 03/17/2014  . Trigger finger, acquired 08/26/2013  . Benign paroxysmal positional vertigo 08/26/2013  . OSA (obstructive sleep apnea) 08/10/2013  . Chronic mastitis of left breast 07/04/2013  . Breast cancer ([facility]) 02/10/2012  . 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 BACK PAIN, CHRONIC 05/10/2007  . PEPTIC ULCER DISEASE, HX OF 05/10/2007  . HYSTERECTOMY, HX OF 05/10/2007  . TONSILLECTOMY, HX OF 05/10/2007  . Hypothyroidism 01/28/2007  . Hypertriglyceridemia 01/28/2007  . Overweight 01/28/2007  . COMMON MIGRAINE 01/28/2007  .  Essential hypertension 01/28/2007  . FATTY LIVER DISEASE 01/28/2007  . CARPAL TUNNEL SYNDROME, HX OF 01/27/2007    Past Medical History:  Diagnosis Date  . Allergy   . 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 Arthritis   . Backache, unspecified   . breast ca dx'd 06/2009   breast cancer left - lumpectomy  . Diabetes mellitus without complication ([facility])   . Full dentures   . Migraine without aura, without mention of intractable migraine without mention of status migrainosus   . Obesity, unspecified   . Other abnormal glucose   . Other and unspecified hyperlipidemia   . Other chronic nonalcoholic liver disease   . Personal history of peptic ulcer disease   . Personal history of radiation therapy   . 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 Sleep apnea   . Tachy-brady syndrome ([facility])   . Unspecified essential hypertension 09/12/13   remote history; no current treatment  . Unspecified hypothyroidism   . Wears glasses     Past Surgical History:  Procedure Laterality Date  . ABDOMINAL HYSTERECTOMY  1988  . BREAST BIOPSY Left    in the 80's - benign tissue  . BREAST LUMPECTOMY Left 2011   Feb '11  . Breast Surgery x 2 Left   . INCISION AND DRAINAGE OF WOUND Left 09/14/2013   Procedure: IRRIGATION AND DEBRIDEMENT LEFT BREAST ABSCESS;  Surgeon: Maisie Fus A. Cornett, MD;  Location: [facility];  Service: General;  Laterality: Left;  .  INCISION AND DRAINAGE OF WOUND Left 12/21/2013   Procedure: DEBRIDEMENT OF LEFT BREAST WOUND WITH PLACEMENT OF A CELL;  Surgeon: Wayland Denis, DO;  Location: [facility];  Service: Plastics;  Laterality: Left;  . LAPAROSCOPY ABDOMEN DIAGNOSTIC    . TONSILLECTOMY    . TUBAL LIGATION  1971    Social History   Socioeconomic History  . Marital status: Married    Spouse name: Not on file  . Number of children: Not on file  . Years of education: 104  . Highest education  level: Not on file  Occupational History  . Occupation: Administrator, arts: [employer]  Social Needs  . Financial resource strain: Not on file  . Food insecurity:    Worry: Not on file    Inability: Not on file  . Transportation needs:    Medical: Not on file    Non-medical: Not on file  Tobacco Use  . Smoking status: Never Smoker  . Smokeless tobacco: Never Used  Substance and Sexual Activity  . Alcohol use: No    Alcohol/week: 0.0 standard drinks  . Drug use: No  . Sexual activity: Yes    Partners: Male  Lifestyle  . Physical activity:    Days per week: Not on file    Minutes per session: Not on file  . Stress: Not on file  Relationships  . Social connections:    Talks on phone: Not on file    Gets together: Not on file    Attends religious service: Not on file    Active member of club or organization: Not on file    Attends meetings of clubs or organizations: Not on file    Relationship status: Not on file  . Intimate partner violence:    Fear of current or ex partner: Not on file    Emotionally abused: Not on file    Physically abused: Not on file    Forced sexual activity: Not on file  Other Topics Concern  . Not on file  Social History Narrative   HSG, [employer] - 3 yrs, no degree. Married -'81  1 son -'68, 1 daughter-'70; 2 grandchildren.  Work - Patent examiner for [employer].  Marriage is in good health. She enjoys her work.                         Family History  Problem Relation Age of Onset  . Peripheral vascular disease Mother   . Coronary artery disease Mother   . Hyperlipidemia Mother   . Heart disease Mother        CAD/fatal MI  . Diabetes Mother   . Stroke Mother   . Diabetes Sister   . Hypothyroidism Sister   . Diabetes Brother   . Hyperlipidemia Brother   . Heart disease Brother   . Hypertrophic cardiomyopathy Brother        congenital  . Colon cancer Neg Hx   . Esophageal cancer Neg Hx   . Pancreatic cancer  Neg Hx   . Stomach cancer Neg Hx   . Rectal cancer Neg Hx     Allergies  Allergen Reactions  . Metoprolol     Lips swelled/ hands swelled and her skin turned red   . Sulfonamide Derivatives     Medication list reviewed and updated in full in [facility].   GEN: No acute illnesses, no fevers, chills. GI: No n/v/d,  eating normally Pulm: No SOB Interactive and getting along well at home.  Otherwise, ROS is as per the HPI.  Objective:   BP 120/66   Pulse 81   Temp 97.9 F (36.6 C) (Oral)   Ht 5\' 6"  (1.676 m)   Wt 182 lb (82.6 kg)   SpO2 98%   BMI 29.38 kg/m   GEN: WDWN, NAD, Non-toxic, A & O x 3 HEENT: Atraumatic, Normocephalic. Neck supple. No masses, No LAD. Ears and Nose: No external deformity. CV: RRR, No M/G/R. No JVD. No thrill. No extra heart sounds. PULM: CTA B, no wheezes, crackles, rhonchi. No retractions. No resp. distress. No accessory muscle use.  R lowest 2 ribs anteriorly ttp. Also painful near rectus insertion  EXTR: No c/c/e NEURO Normal gait.  PSYCH: Normally interactive. Conversant. Not depressed or anxious appearing.  Calm demeanor.   Laboratory and Imaging Data:  Assessment and Plan:   Intercostal muscle strain, initial encounter  Clearly MSK. Basic, gentle stretching with ibuprofen and tylenol prn  Follow-up: No follow-ups on file.  Signed,  Elpidio Galea. Bethan Adamek, MD   Allergies as of 02/03/2018      Reactions   Metoprolol    Lips swelled/ hands swelled and her skin turned red    Sulfonamide Derivatives       Medication List        Accurate as of 02/03/18 11:59 PM. Always use your most recent med list.          fenofibrate 54 MG tablet Take 1 tablet (54 mg total) by mouth daily.   levothyroxine 125 MCG tablet Commonly known as:  SYNTHROID, LEVOTHROID Take 1 tablet (125 mcg total) by mouth daily.   losartan 25 MG tablet Commonly known as:  COZAAR Take 1 tablet (25 mg total) by mouth daily.   metFORMIN 500 MG  tablet Commonly known as:  GLUCOPHAGE Take 1 tablet (500 mg total) by mouth 2 (two) times daily with a meal.   multivitamin,tx-minerals tablet Take 1 tablet by mouth 2 (two) times a week.   OMEGA 3 PO Take 1 capsule by mouth 2 (two) times a week.   vitamin C 500 MG tablet Commonly known as:  ASCORBIC ACID Take 500 mg by mouth daily.   Vitamin D3 2000 units Tabs Take by mouth.   vitamin E 100 UNIT capsule Take 100 Units by mouth daily.

## 2018-02-04 ENCOUNTER — Encounter: Payer: Self-pay | Admitting: Family Medicine

## 2018-02-05 DIAGNOSIS — M65331 Trigger finger, right middle finger: Secondary | ICD-10-CM | POA: Diagnosis not present

## 2018-02-12 ENCOUNTER — Ambulatory Visit
Admission: RE | Admit: 2018-02-12 | Discharge: 2018-02-12 | Disposition: A | Discharge status: Home / Self Care | Payer: PPO | Source: Ambulatory Visit | Attending: Family Medicine | Admitting: Family Medicine

## 2018-02-12 DIAGNOSIS — E2839 Other primary ovarian failure: Secondary | ICD-10-CM

## 2018-02-12 DIAGNOSIS — Z1231 Encounter for screening mammogram for malignant neoplasm of breast: Secondary | ICD-10-CM

## 2018-02-12 DIAGNOSIS — M8589 Other specified disorders of bone density and structure, multiple sites: Secondary | ICD-10-CM | POA: Diagnosis not present

## 2018-02-12 DIAGNOSIS — Z780 Asymptomatic menopausal state: Secondary | ICD-10-CM | POA: Diagnosis not present

## 2018-02-16 DIAGNOSIS — M65331 Trigger finger, right middle finger: Secondary | ICD-10-CM | POA: Diagnosis not present

## 2018-03-05 DIAGNOSIS — H25813 Combined forms of age-related cataract, bilateral: Secondary | ICD-10-CM | POA: Diagnosis not present

## 2018-03-05 DIAGNOSIS — H1859 Other hereditary corneal dystrophies: Secondary | ICD-10-CM | POA: Diagnosis not present

## 2018-03-05 DIAGNOSIS — E119 Type 2 diabetes mellitus without complications: Secondary | ICD-10-CM | POA: Diagnosis not present

## 2018-03-29 DIAGNOSIS — J111 Influenza due to unidentified influenza virus with other respiratory manifestations: Secondary | ICD-10-CM | POA: Diagnosis not present

## 2018-03-29 DIAGNOSIS — E860 Dehydration: Secondary | ICD-10-CM | POA: Diagnosis not present

## 2018-03-29 DIAGNOSIS — J020 Streptococcal pharyngitis: Secondary | ICD-10-CM | POA: Diagnosis not present

## 2018-03-30 DIAGNOSIS — J020 Streptococcal pharyngitis: Secondary | ICD-10-CM | POA: Diagnosis not present

## 2018-03-30 DIAGNOSIS — R5383 Other fatigue: Secondary | ICD-10-CM | POA: Diagnosis not present

## 2018-03-30 DIAGNOSIS — J111 Influenza due to unidentified influenza virus with other respiratory manifestations: Secondary | ICD-10-CM | POA: Diagnosis not present

## 2018-03-30 DIAGNOSIS — R112 Nausea with vomiting, unspecified: Secondary | ICD-10-CM | POA: Diagnosis not present

## 2018-03-31 DIAGNOSIS — R112 Nausea with vomiting, unspecified: Secondary | ICD-10-CM | POA: Diagnosis not present

## 2018-03-31 DIAGNOSIS — J020 Streptococcal pharyngitis: Secondary | ICD-10-CM | POA: Diagnosis not present

## 2018-03-31 DIAGNOSIS — J111 Influenza due to unidentified influenza virus with other respiratory manifestations: Secondary | ICD-10-CM | POA: Diagnosis not present

## 2018-03-31 DIAGNOSIS — R5383 Other fatigue: Secondary | ICD-10-CM | POA: Diagnosis not present

## 2018-05-06 ENCOUNTER — Telehealth: Payer: Self-pay | Admitting: Family Medicine

## 2018-05-06 DIAGNOSIS — E119 Type 2 diabetes mellitus without complications: Secondary | ICD-10-CM

## 2018-05-06 DIAGNOSIS — I10 Essential (primary) hypertension: Secondary | ICD-10-CM

## 2018-05-06 DIAGNOSIS — E781 Pure hyperglyceridemia: Secondary | ICD-10-CM

## 2018-05-06 NOTE — Telephone Encounter (Signed)
-----   Message from Ellamae Sia sent at 04/29/2018 10:16 AM EST ----- Regarding: lab orders for Friday, 1.3.20 Lab orders for a 6 month follow up appt

## 2018-05-07 ENCOUNTER — Other Ambulatory Visit (INDEPENDENT_AMBULATORY_CARE_PROVIDER_SITE_OTHER): Payer: PPO

## 2018-05-07 DIAGNOSIS — I10 Essential (primary) hypertension: Secondary | ICD-10-CM

## 2018-05-07 DIAGNOSIS — E119 Type 2 diabetes mellitus without complications: Secondary | ICD-10-CM | POA: Diagnosis not present

## 2018-05-07 DIAGNOSIS — E781 Pure hyperglyceridemia: Secondary | ICD-10-CM

## 2018-05-07 LAB — COMPREHENSIVE METABOLIC PANEL
ALBUMIN: 4.2 g/dL (ref 3.5–5.2)
ALK PHOS: 36 U/L — AB (ref 39–117)
ALT: 14 U/L (ref 0–35)
AST: 15 U/L (ref 0–37)
BUN: 16 mg/dL (ref 6–23)
CO2: 27 mEq/L (ref 19–32)
Calcium: 9.7 mg/dL (ref 8.4–10.5)
Chloride: 104 mEq/L (ref 96–112)
Creatinine, Ser: 1.01 mg/dL (ref 0.40–1.20)
GFR: 57.71 mL/min — ABNORMAL LOW (ref >60.00)
Glucose, Bld: 125 mg/dL — ABNORMAL HIGH (ref 70–99)
Potassium: 4.1 mEq/L (ref 3.5–5.1)
Sodium: 139 mEq/L (ref 135–145)
Total Bilirubin: 0.4 mg/dL (ref 0.2–1.2)
Total Protein: 7.2 g/dL (ref 6.0–8.3)

## 2018-05-07 LAB — LIPID PANEL
CHOLESTEROL: 174 mg/dL (ref 0–200)
HDL: 46.8 mg/dL (ref >39.00)
LDL CALC: 98 mg/dL (ref 0–99)
NonHDL: 127.63
TRIGLYCERIDES: 149 mg/dL (ref 0.0–149.0)
Total CHOL/HDL Ratio: 4
VLDL: 29.8 mg/dL (ref 0.0–40.0)

## 2018-05-07 LAB — HEMOGLOBIN A1C: Hgb A1c MFr Bld: 6.4 % (ref 4.6–6.5)

## 2018-05-10 ENCOUNTER — Encounter: Payer: Self-pay | Admitting: Family Medicine

## 2018-05-10 ENCOUNTER — Ambulatory Visit (INDEPENDENT_AMBULATORY_CARE_PROVIDER_SITE_OTHER): Payer: PPO | Admitting: Family Medicine

## 2018-05-10 VITALS — BP 128/72 | HR 69 | Temp 97.8°F | Ht 66.0 in | Wt 174.5 lb

## 2018-05-10 DIAGNOSIS — E781 Pure hyperglyceridemia: Secondary | ICD-10-CM | POA: Diagnosis not present

## 2018-05-10 DIAGNOSIS — C50912 Malignant neoplasm of unspecified site of left female breast: Secondary | ICD-10-CM

## 2018-05-10 DIAGNOSIS — E119 Type 2 diabetes mellitus without complications: Secondary | ICD-10-CM

## 2018-05-10 DIAGNOSIS — I10 Essential (primary) hypertension: Secondary | ICD-10-CM | POA: Diagnosis not present

## 2018-05-10 MED ORDER — ATORVASTATIN CALCIUM 10 MG PO TABS: 5.0000 mg | ORAL_TABLET | Freq: Every day | ORAL | 3 refills | Status: DC

## 2018-05-10 NOTE — Progress Notes (Signed)
Subjective:    Patient ID: Carla Mccoy, female    DOB: 1950-07-31, 68 y.o.   MRN: [id number]  HPI Here for f/u of chronic health problems   Had the flu over thanksgiving -lost weight and could not get over it   Wt Readings from Last 3 Encounters:  05/10/18 174 lb 8 oz (79.2 kg)  02/03/18 182 lb (82.6 kg)  11/03/17 181 lb (82.1 kg)  lost wt -significant  28.17 kg/m   bp is stable today  No cp or palpitations or headaches or edema  No side effects to medicines  BP Readings from Last 3 Encounters:  05/10/18 128/72  02/03/18 120/66  11/03/17 128/60      Diabetes Home sugar results  DM diet -staying away from bread  Exercise -is doing 30 minutes daily /walking Symptoms-none  A1C last  Lab Results  Component Value Date   HGBA1C 6.4 05/07/2018  down from 6.8    No problems with medications  Metformin  Renal protection-arb Last eye exam  8/19   Lab Results  Component Value Date   CREATININE 1.01 05/07/2018   BUN 16 05/07/2018   NA 139 05/07/2018   K 4.1 05/07/2018   CL 104 05/07/2018   CO2 27 05/07/2018     Hyperlipidemia/triglycerides  Lab Results  Component Value Date   CHOL 174 05/07/2018   CHOL 152 10/19/2017   CHOL 158 03/09/2017   Lab Results  Component Value Date   HDL 46.80 05/07/2018   HDL 45.00 10/19/2017   HDL 40.00 03/09/2017   Lab Results  Component Value Date   LDLCALC 98 05/07/2018   LDLCALC 75 10/19/2017   LDLCALC 82 03/09/2017   Lab Results  Component Value Date   TRIG 149.0 05/07/2018   TRIG 156.0 (H) 10/19/2017   TRIG 178.0 (H) 03/09/2017   Lab Results  Component Value Date   CHOLHDL 4 05/07/2018   CHOLHDL 3 10/19/2017   CHOLHDL 4 03/09/2017   Lab Results  Component Value Date   LDLDIRECT 70.0 05/07/2016   LDLDIRECT 85.0 11/02/2015   LDLDIRECT 93.0 03/19/2015   Takes fenofibrate    Patient Active Problem List   Diagnosis Date Noted  . Estrogen deficiency 11/03/2017  . Osteopenia 11/03/2017  . Leg cramps  11/18/2016  . Family history of congenital heart disease 11/18/2016  . Arm pain, chronic 11/18/2016  . Cough due to ACE inhibitor 11/18/2016  . Routine general medical examination at a health care facility 03/26/2015  . Colon cancer screening 03/24/2014  . Encounter for Medicare annual wellness exam 03/24/2014  . Diabetes type 2, controlled ([place]) 03/17/2014  . Trigger finger, acquired 08/26/2013  . Benign paroxysmal positional vertigo 08/26/2013  . OSA (obstructive sleep apnea) 08/10/2013  . Chronic mastitis of left breast 07/04/2013  . Malignant neoplasm of left female breast ([place]) 02/10/2012  . 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 BACK PAIN, CHRONIC 05/10/2007  . PEPTIC ULCER DISEASE, HX OF 05/10/2007  . HYSTERECTOMY, HX OF 05/10/2007  . TONSILLECTOMY, HX OF 05/10/2007  . Hypothyroidism 01/28/2007  . Hypertriglyceridemia 01/28/2007  . Overweight 01/28/2007  . COMMON MIGRAINE 01/28/2007  . Essential hypertension 01/28/2007  . FATTY LIVER DISEASE 01/28/2007  . CARPAL TUNNEL SYNDROME, HX OF 01/27/2007   Past Medical History:  Diagnosis Date  . Allergy   . 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 Arthritis   . Backache, unspecified   . breast ca dx'd 06/2009   breast cancer left - lumpectomy  .  Diabetes mellitus without complication ([place])   . Full dentures   . Migraine without aura, without mention of intractable migraine without mention of status migrainosus   . Obesity, unspecified   . Other abnormal glucose   . Other and unspecified hyperlipidemia   . Other chronic nonalcoholic liver disease   . Personal history of peptic ulcer disease   . Personal history of radiation therapy   . 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 Sleep apnea   . Tachy-brady syndrome ([place])   . Unspecified essential hypertension 09/12/13   remote history; no current treatment  . Unspecified hypothyroidism   . Wears glasses    Past Surgical  History:  Procedure Laterality Date  . ABDOMINAL HYSTERECTOMY  1988  . BREAST BIOPSY Left    in the 80's - benign tissue  . BREAST LUMPECTOMY Left 2011   Feb '11  . Breast Surgery x 2 Left   . INCISION AND DRAINAGE OF WOUND Left 09/14/2013   Procedure: IRRIGATION AND DEBRIDEMENT LEFT BREAST ABSCESS;  Surgeon: Marcello Moores A. Cornett, MD;  Location: [facility];  Service: General;  Laterality: Left;  . INCISION AND DRAINAGE OF WOUND Left 12/21/2013   Procedure: DEBRIDEMENT OF LEFT BREAST WOUND WITH PLACEMENT OF A CELL;  Surgeon: Theodoro Kos, DO;  Location: [facility];  Service: Plastics;  Laterality: Left;  . LAPAROSCOPY ABDOMEN DIAGNOSTIC    . TONSILLECTOMY    . TUBAL LIGATION  1971   Social History   Tobacco Use  . Smoking status: Never Smoker  . Smokeless tobacco: Never Used  Substance Use Topics  . Alcohol use: No    Alcohol/week: 0.0 standard drinks  . Drug use: No   Family History  Problem Relation Age of Onset  . Peripheral vascular disease Mother   . Coronary artery disease Mother   . Hyperlipidemia Mother   . Heart disease Mother        CAD/fatal MI  . Diabetes Mother   . Stroke Mother   . Diabetes Sister   . Hypothyroidism Sister   . Diabetes Brother   . Hyperlipidemia Brother   . Heart disease Brother   . Hypertrophic cardiomyopathy Brother        congenital  . Colon cancer Neg Hx   . Esophageal cancer Neg Hx   . Pancreatic cancer Neg Hx   . Stomach cancer Neg Hx   . Rectal cancer Neg Hx   . Breast cancer Neg Hx    Allergies  Allergen Reactions  . Metoprolol     Lips swelled/ hands swelled and her skin turned red   . Sulfonamide Derivatives    Current Outpatient Medications on File Prior to Visit  Medication Sig Dispense Refill  . Cholecalciferol (VITAMIN D3) 2000 UNITS TABS Take by mouth.      . fenofibrate 54 MG tablet Take 1 tablet (54 mg total) by mouth daily. 90 tablet 3  . levothyroxine (SYNTHROID, LEVOTHROID) 125 MCG  tablet Take 1 tablet (125 mcg total) by mouth daily. 90 tablet 3  . losartan (COZAAR) 25 MG tablet Take 1 tablet (25 mg total) by mouth daily. 90 tablet 3  . metFORMIN (GLUCOPHAGE) 500 MG tablet Take 1 tablet (500 mg total) by mouth 2 (two) times daily with a meal. 180 tablet 3  . Multiple Vitamins-Minerals (MULTIVITAMIN,TX-MINERALS) tablet Take 1 tablet by mouth 2 (two) times a week.     . Omega-3 Fatty Acids (  OMEGA 3 PO) Take 1 capsule by mouth 2 (two) times a week.     . vitamin C (ASCORBIC ACID) 500 MG tablet Take 500 mg by mouth daily.    . vitamin E 100 UNIT capsule Take 100 Units by mouth daily.      No current facility-administered medications on file prior to visit.     Review of Systems  Constitutional: Negative for activity change, appetite change, fatigue, fever and unexpected weight change.  HENT: Negative for congestion, ear pain, rhinorrhea, sinus pressure and sore throat.   Eyes: Negative for pain, redness and visual disturbance.  Respiratory: Negative for cough, shortness of breath and wheezing.   Cardiovascular: Negative for chest pain and palpitations.  Gastrointestinal: Negative for abdominal pain, blood in stool, constipation and diarrhea.  Endocrine: Negative for polydipsia and polyuria.  Genitourinary: Negative for dysuria, frequency and urgency.  Musculoskeletal: Negative for arthralgias, back pain and myalgias.  Skin: Negative for pallor and rash.  Allergic/Immunologic: Negative for environmental allergies.  Neurological: Negative for dizziness, syncope and headaches.  Hematological: Negative for adenopathy. Does not bruise/bleed easily.  Psychiatric/Behavioral: Negative for decreased concentration and dysphoric mood. The patient is not nervous/anxious.        Objective:   Physical Exam Constitutional:      General: She is not in acute distress.    Appearance: Normal appearance. She is well-developed. She is not ill-appearing.  HENT:     Head: Normocephalic  and atraumatic.  Eyes:     Conjunctiva/sclera: Conjunctivae normal.     Pupils: Pupils are equal, round, and reactive to light.  Neck:     Musculoskeletal: Normal range of motion and neck supple.     Thyroid: No thyromegaly.     Vascular: No carotid bruit or JVD.  Cardiovascular:     Rate and Rhythm: Normal rate and regular rhythm.     Pulses: Normal pulses.     Heart sounds: Normal heart sounds. No gallop.   Pulmonary:     Effort: Pulmonary effort is normal. No respiratory distress.     Breath sounds: Normal breath sounds. No wheezing or rales.  Abdominal:     General: Bowel sounds are normal. There is no distension or abdominal bruit.     Palpations: Abdomen is soft. There is no mass.     Tenderness: There is no abdominal tenderness.  Lymphadenopathy:     Cervical: No cervical adenopathy.  Skin:    General: Skin is warm and dry.     Coloration: Skin is not pale.     Findings: No rash.  Neurological:     General: No focal deficit present.     Mental Status: She is alert.     Deep Tendon Reflexes: Reflexes are normal and symmetric.  Psychiatric:        Mood and Affect: Mood normal.           Assessment & Plan:   Problem List Items Addressed This Visit      Cardiovascular and Mediastinum   Essential hypertension    bp in fair control at this time  BP Readings from Last 1 Encounters:  05/10/18 128/72   No changes needed Most recent labs reviewed  Disc lifstyle change with low sodium diet and exercise        Relevant Medications   atorvastatin (LIPITOR) 10 MG tablet     Endocrine   Diabetes type 2, controlled ([place]) - Primary    Well controlled Lab Results  Component Value  Date   HGBA1C 6.4 05/07/2018   No change in tx -metformin  Keep working on diet/exercise Will add statin low dose for vasc protection  Disc eye and foot care       Relevant Medications   atorvastatin (LIPITOR) 10 MG tablet     Other   Malignant neoplasm of left female breast  ([place])    Doing well -continues screening       Hypertriglyceridemia    On fenofibrate-controlled  Disc goals for lipids and reasons to control them Rev last labs with pt Rev low sat fat diet in detail Adding low dose atorvastatin in light of DM inst to alert [name] if side effects       Relevant Medications   atorvastatin (LIPITOR) 10 MG tablet

## 2018-05-10 NOTE — Patient Instructions (Addendum)
Start atorvastatin (for cholesterol) 1/2 pill daily  This decreases risk of vascular disease/heart/stroke If any side effects please let us know   Continue other medicines   Continue regular exercise and good diet - good job so far   Follow up for physical as planned

## 2018-05-11 NOTE — Assessment & Plan Note (Signed)
On fenofibrate-controlled  Disc goals for lipids and reasons to control them Rev last labs with pt Rev low sat fat diet in detail Adding low dose atorvastatin in light of DM inst to alert [name] if side effects

## 2018-05-11 NOTE — Assessment & Plan Note (Signed)
Well controlled Lab Results  Component Value Date   HGBA1C 6.4 05/07/2018   No change in tx -metformin  Keep working on diet/exercise Will add statin low dose for vasc protection  Disc eye and foot care

## 2018-05-11 NOTE — Assessment & Plan Note (Signed)
Doing well -continues screening

## 2018-05-11 NOTE — Assessment & Plan Note (Signed)
bp in fair control at this time  BP Readings from Last 1 Encounters:  05/10/18 128/72   No changes needed Most recent labs reviewed  Disc lifstyle change with low sodium diet and exercise

## 2018-08-26 ENCOUNTER — Encounter: Payer: Self-pay | Admitting: Family Medicine

## 2018-08-26 ENCOUNTER — Ambulatory Visit (INDEPENDENT_AMBULATORY_CARE_PROVIDER_SITE_OTHER): Payer: PPO | Admitting: Family Medicine

## 2018-08-26 DIAGNOSIS — M722 Plantar fascial fibromatosis: Secondary | ICD-10-CM | POA: Diagnosis not present

## 2018-08-26 NOTE — Assessment & Plan Note (Signed)
Acute on chronic Sent pt handouts on condition and rehab inst to wear shoes full time with inserts  Ice / massage when able  Do not go barefoot  Try the exercises/stretches given Start back on meloxicam 15 mg daily with food that she already has Update if not starting to improve in a week or if worsening   If not improved 2 week-call for appt with Dr Lorelei Pont

## 2018-08-26 NOTE — Progress Notes (Signed)
Virtual Visit via Video Note  I connected with Carla Mccoy on 08/26/18 at 11:15 AM EDT by a video enabled telemedicine application and verified that I am speaking with the correct person using two identifiers.  the patient is at home today  I am in my office at [place]  I discussed the limitations of evaluation and management by telemedicine and the availability of in person appointments. The patient expressed understanding and agreed to proceed.  History of Present Illness: Pt presents with L heel pain  She is a diabetic   No sores or open areas Some brown discoloration  No bruising   3 months of pain in L heel with radiation to the ankle (foot) This was bad years ago  Worse after she has been off her feet  When she gets up in am she can barely walk on it  Difficulty bearing weight -has to limp Improves after being up for about 30 minutes  Has to avoid sitting down  Does exercise video- Charlean Sanfilippo   3-5 times per week   Foot/ankle sometimes swell -occ  She has a medium arch  Confined at home  Keeping great grandchild 24/7 now since mid march Active  Wears easy spirit slip ons (no back) with fair support  She has used an orthotic from foot doctor  Ordered pads for her shoes - not custom orthotics  No splints    8/10 on pain scale when at its worst  Some tenderness of the heel  occ tingling in both feet-worse in R ? Neuropathy  Lab Results  Component Value Date   HGBA1C 6.4 05/07/2018   Well controlled diabetes   H/of bone spur- this has acted up in the past  No h/o trauma  Using heel gel cushions -helpful  Also tylenol- no help    She has some meloxicam  Has not taken it   Review of Systems  Constitutional: Negative for chills, fever, malaise/fatigue and weight loss.  Respiratory: Negative for cough and shortness of breath.   Cardiovascular: Negative for palpitations.  Gastrointestinal: Negative for heartburn and nausea.   Musculoskeletal: Positive for myalgias.       L foot (heel) pain   Skin: Negative for itching and rash.  Neurological: Positive for tingling. Negative for headaches.  Psychiatric/Behavioral: Negative for depression.     Patient Active Problem List   Diagnosis Date Noted  . Plantar fasciitis of left foot 08/26/2018  . Estrogen deficiency 11/03/2017  . Osteopenia 11/03/2017  . Leg cramps 11/18/2016  . Family history of congenital heart disease 11/18/2016  . Arm pain, chronic 11/18/2016  . Cough due to ACE inhibitor 11/18/2016  . Routine general medical examination at a health care facility 03/26/2015  . Colon cancer screening 03/24/2014  . Encounter for Medicare annual wellness exam 03/24/2014  . Diabetes type 2, controlled ([place]) 03/17/2014  . Trigger finger, acquired 08/26/2013  . Benign paroxysmal positional vertigo 08/26/2013  . OSA (obstructive sleep apnea) 08/10/2013  . Chronic mastitis of left breast 07/04/2013  . Malignant neoplasm of left female breast ([place]) 02/10/2012  . 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 BACK PAIN, CHRONIC 05/10/2007  . PEPTIC ULCER DISEASE, HX OF 05/10/2007  . HYSTERECTOMY, HX OF 05/10/2007  . TONSILLECTOMY, HX OF 05/10/2007  . Hypothyroidism 01/28/2007  . Hypertriglyceridemia 01/28/2007  . Overweight 01/28/2007  . COMMON MIGRAINE 01/28/2007  . Essential hypertension 01/28/2007  . FATTY LIVER DISEASE 01/28/2007  . CARPAL TUNNEL SYNDROME, HX OF 01/27/2007  Past Medical History:  Diagnosis Date  . Allergy   . 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 Arthritis   . Backache, unspecified   . breast ca dx'd 06/2009   breast cancer left - lumpectomy  . Diabetes mellitus without complication ([place])   . Full dentures   . Migraine without aura, without mention of intractable migraine without mention of status migrainosus   . Obesity, unspecified   . Other abnormal glucose   . Other and unspecified hyperlipidemia   . Other chronic  nonalcoholic liver disease   . Personal history of peptic ulcer disease   . Personal history of radiation therapy   . Rheumatic chorea without mention of heart involvement age 36   syndeham chorea  . Sleep apnea   . Tachy-brady syndrome ([place])   . Unspecified essential hypertension 09/12/13   remote history; no current treatment  . Unspecified hypothyroidism   . Wears glasses    Past Surgical History:  Procedure Laterality Date  . ABDOMINAL HYSTERECTOMY  1988  . BREAST BIOPSY Left    in the 80's - benign tissue  . BREAST LUMPECTOMY Left 2011   Feb '11  . Breast Surgery x 2 Left   . INCISION AND DRAINAGE OF WOUND Left 09/14/2013   Procedure: IRRIGATION AND DEBRIDEMENT LEFT BREAST ABSCESS;  Surgeon: Marcello Moores A. Cornett, MD;  Location: [facility];  Service: General;  Laterality: Left;  . INCISION AND DRAINAGE OF WOUND Left 12/21/2013   Procedure: DEBRIDEMENT OF LEFT BREAST WOUND WITH PLACEMENT OF A CELL;  Surgeon: Theodoro Kos, DO;  Location: [facility];  Service: Plastics;  Laterality: Left;  . LAPAROSCOPY ABDOMEN DIAGNOSTIC    . TONSILLECTOMY    . TUBAL LIGATION  1971   Social History   Tobacco Use  . Smoking status: Never Smoker  . Smokeless tobacco: Never Used  Substance Use Topics  . Alcohol use: No    Alcohol/week: 0.0 standard drinks  . Drug use: No   Family History  Problem Relation Age of Onset  . Peripheral vascular disease Mother   . Coronary artery disease Mother   . Hyperlipidemia Mother   . Heart disease Mother        CAD/fatal MI  . Diabetes Mother   . Stroke Mother   . Diabetes Sister   . Hypothyroidism Sister   . Diabetes Brother   . Hyperlipidemia Brother   . Heart disease Brother   . Hypertrophic cardiomyopathy Brother        congenital  . Colon cancer Neg Hx   . Esophageal cancer Neg Hx   . Pancreatic cancer Neg Hx   . Stomach cancer Neg Hx   . Rectal cancer Neg Hx   . Breast cancer Neg Hx    Allergies  Allergen  Reactions  . Metoprolol     Lips swelled/ hands swelled and her skin turned red   . Sulfonamide Derivatives    Current Outpatient Medications on File Prior to Visit  Medication Sig Dispense Refill  . atorvastatin (LIPITOR) 10 MG tablet Take 0.5 tablets (5 mg total) by mouth daily. 45 tablet 3  . Cholecalciferol (VITAMIN D3) 2000 UNITS TABS Take by mouth.      . fenofibrate 54 MG tablet Take 1 tablet (54 mg total) by mouth daily. 90 tablet 3  . levothyroxine (SYNTHROID, LEVOTHROID) 125 MCG tablet Take 1 tablet (125 mcg total) by mouth daily. 90 tablet 3  . losartan (COZAAR)  25 MG tablet Take 1 tablet (25 mg total) by mouth daily. 90 tablet 3  . metFORMIN (GLUCOPHAGE) 500 MG tablet Take 1 tablet (500 mg total) by mouth 2 (two) times daily with a meal. 180 tablet 3  . Multiple Vitamins-Minerals (MULTIVITAMIN,TX-MINERALS) tablet Take 1 tablet by mouth 2 (two) times a week.     . Omega-3 Fatty Acids (OMEGA 3 PO) Take 1 capsule by mouth 2 (two) times a week.     . vitamin C (ASCORBIC ACID) 500 MG tablet Take 500 mg by mouth daily.    . vitamin E 100 UNIT capsule Take 100 Units by mouth daily.      No current facility-administered medications on file prior to visit.     Observations/Objective: Pt appeared well  Talkative with normal affect, also mentally sharp  No facial swelling or asymmetry  No voice abnormalities Not coughing  No obv swelling of L foot or ankle , some baseline brown discoloration of top of feet  Nl rom of L foot and pt is able to bear weight with limp  Assessment and Plan: Problem List Items Addressed This Visit      Musculoskeletal and Integument   Plantar fasciitis of left foot - Primary    Acute on chronic Sent pt handouts on condition and rehab inst to wear shoes full time with inserts  Ice / massage when able  Do not go barefoot  Try the exercises/stretches given Start back on meloxicam 15 mg daily with food that she already has Update if not starting to  improve in a week or if worsening   If not improved 2 week-call for appt with Dr Lorelei Pont             Follow Up Instructions: Take meloxicam daily for 2 weeks with food Massage foot with ice (frozen bottle)  Wear shoe with a back and firm sole at all times/do not go barefoot (and wear your shoe insert as well) I will mail you some handouts on plantar fasciitis  There are some stretches that may help  If not improved significantly in 2 weeks-call and let us know (we can set up an appt with Dr Lorelei Pont if needed)       I discussed the assessment and treatment plan with the patient. The patient was provided an opportunity to ask questions and all were answered. The patient agreed with the plan and demonstrated an understanding of the instructions.   The patient was advised to call back or seek an in-person evaluation if the symptoms worsen or if the condition fails to improve as anticipated.     Loura Pardon, MD

## 2018-08-26 NOTE — Patient Instructions (Signed)
Plantar Fasciitis    Plantar fasciitis is a painful foot condition that affects the heel. It occurs when the band of tissue that connects the toes to the heel bone (plantar fascia) becomes irritated. This can happen as the result of exercising too much or doing other repetitive activities (overuse injury).  The pain from plantar fasciitis can range from mild irritation to severe pain that makes it difficult to walk or move. The pain is usually worse in the morning after sleeping, or after sitting or lying down for a while. Pain may also be worse after long periods of walking or standing.  What are the causes?  This condition may be caused by:   Standing for long periods of time.   Wearing shoes that do not have good arch support.   Doing activities that put stress on joints (high-impact activities), including running, aerobics, and ballet.   Being overweight.   An abnormal way of walking (gait).   Tight muscles in the back of your lower leg (calf).   High arches in your feet.   Starting a new athletic activity.  What are the signs or symptoms?  The main symptom of this condition is heel pain. Pain may:   Be worse with first steps after a time of rest, especially in the morning after sleeping or after you have been sitting or lying down for a while.   Be worse after long periods of standing still.   Decrease after 30-45 minutes of activity, such as gentle walking.  How is this diagnosed?  This condition may be diagnosed based on your medical history and your symptoms. Your health care provider may ask questions about your activity level. Your health care provider will do a physical exam to check for:   A tender area on the bottom of your foot.   A high arch in your foot.   Pain when you move your foot.   Difficulty moving your foot.  You may have imaging tests to confirm the diagnosis, such as:   X-rays.   Ultrasound.   MRI.  How is this treated?  Treatment for plantar fasciitis depends on how  severe your condition is. Treatment may include:   Rest, ice, applying pressure (compression), and raising the affected foot (elevation). This may be called RICE therapy. Your health care provider may recommend RICE therapy along with over-the-counter pain medicines to manage your pain.   Exercises to stretch your calves and your plantar fascia.   A splint that holds your foot in a stretched, upward position while you sleep (night splint).   Physical therapy to relieve symptoms and prevent problems in the future.   Injections of steroid medicine (cortisone) to relieve pain and inflammation.   Stimulating your plantar fascia with electrical impulses (extracorporeal shock wave therapy). This is usually the last treatment option before surgery.   Surgery, if other treatments have not worked after 12 months.  Follow these instructions at home:    Managing pain, stiffness, and swelling   If directed, put ice on the painful area:  ? Put ice in a plastic bag, or use a frozen bottle of water.  ? Place a towel between your skin and the bag or bottle.  ? Roll the bottom of your foot over the bag or bottle.  ? Do this for 20 minutes, 2-3 times a day.   Wear athletic shoes that have air-sole or gel-sole cushions, or try wearing soft shoe inserts that are designed for plantar   fasciitis.   Raise (elevate) your foot above the level of your heart while you are sitting or lying down.  Activity   Avoid activities that cause pain. Ask your health care provider what activities are safe for you.   Do physical therapy exercises and stretches as told by your health care provider.   Try activities and forms of exercise that are easier on your joints (low-impact). Examples include swimming, water aerobics, and biking.  General instructions   Take over-the-counter and prescription medicines only as told by your health care provider.   Wear a night splint while sleeping, if told by your health care provider. Loosen the splint  if your toes tingle, become numb, or turn cold and blue.   Maintain a healthy weight, or work with your health care provider to lose weight as needed.   Keep all follow-up visits as told by your health care provider. This is important.  Contact a health care provider if you:   Have symptoms that do not go away after caring for yourself at home.   Have pain that gets worse.   Have pain that affects your ability to move or do your daily activities.  Summary   Plantar fasciitis is a painful foot condition that affects the heel. It occurs when the band of tissue that connects the toes to the heel bone (plantar fascia) becomes irritated.   The main symptom of this condition is heel pain that may be worse after exercising too much or standing still for a long time.   Treatment varies, but it usually starts with rest, ice, compression, and elevation (RICE therapy) and over-the-counter medicines to manage pain.  This information is not intended to replace advice given to you by your health care provider. Make sure you discuss any questions you have with your health care provider.  Document Released: 01/14/2001 Document Revised: 02/16/2017 Document Reviewed: 02/16/2017  [employer]vier Interactive Patient Education  2019 [employer]        Plantar Fasciitis Rehab  Ask your health care provider which exercises are safe for you. Do exercises exactly as told by your health care provider and adjust them as directed. It is normal to feel mild stretching, pulling, tightness, or discomfort as you do these exercises, but you should stop right away if you feel sudden pain or your pain gets worse. Do not begin these exercises until told by your health care provider.  Stretching and range of motion exercises  These exercises warm up your muscles and joints and improve the movement and flexibility of your foot. These exercises also help to relieve pain.  Exercise A: Plantar fascia stretch    1. Sit with your left / right leg crossed  over your opposite knee.  2. Hold your heel with one hand with that thumb near your arch. With your other hand, hold your toes and gently pull them back toward the top of your foot. You should feel a stretch on the bottom of your toes or your foot or both.  3. Hold this stretch for__________ seconds.  4. Slowly release your toes and return to the starting position.  Repeat __________ times. Complete this exercise __________ times a day.  Exercise B: Gastroc, standing    1. Stand with your hands against a wall.  2. Extend your left / right leg behind you, and bend your front knee slightly.  3. Keeping your heels on the floor and keeping your back knee straight, shift your weight toward the   wall without arching your back. You should feel a gentle stretch in your left / right calf.  4. Hold this position for __________ seconds.  Repeat __________ times. Complete this exercise __________ times a day.  Exercise C: Soleus, standing  1. Stand with your hands against a wall.  2. Extend your left / right leg behind you, and bend your front knee slightly.  3. Keeping your heels on the floor, bend your back knee and slightly shift your weight over the back leg. You should feel a gentle stretch deep in your calf.  4. Hold this position for __________ seconds.  Repeat __________ times. Complete this exercise __________ times a day.  Exercise D: Gastrocsoleus, standing  1. Stand with the ball of your left / right foot on a step. The ball of your foot is on the walking surface, right under your toes.  2. Keep your other foot firmly on the same step.  3. Hold onto the wall or a railing for balance.  4. Slowly lift your other foot, allowing your body weight to press your heel down over the edge of the step. You should feel a stretch in your left / right calf.  5. Hold this position for __________ seconds.  6. Return both feet to the step.  7. Repeat this exercise with a slight bend in your left / right knee.  Repeat __________ times  with your left / right knee straight and __________ times with your left / right knee bent. Complete this exercise __________ times a day.  Balance exercise  This exercise builds your balance and strength control of your arch to help take pressure off your plantar fascia.  Exercise E: Single leg stand  1. Without shoes, stand near a railing or in a doorway. You may hold onto the railing or door frame as needed.  2. Stand on your left / right foot. Keep your big toe down on the floor and try to keep your arch lifted. Do not let your foot roll inward.  3. Hold this position for __________ seconds.  4. If this exercise is too easy, you can try it with your eyes closed or while standing on a pillow.  Repeat __________ times. Complete this exercise __________ times a day.  This information is not intended to replace advice given to you by your health care provider. Make sure you discuss any questions you have with your health care provider.  Document Released: 04/21/2005 Document Revised: 12/25/2015 Document Reviewed: 03/05/2015  [employer]vier Interactive Patient Education  2019 [employer]

## 2018-10-27 DIAGNOSIS — H00021 Hordeolum internum right upper eyelid: Secondary | ICD-10-CM | POA: Diagnosis not present

## 2018-11-26 ENCOUNTER — Other Ambulatory Visit: Payer: Self-pay | Admitting: Family Medicine

## 2018-11-27 ENCOUNTER — Other Ambulatory Visit: Payer: Self-pay | Admitting: Family Medicine

## 2018-12-07 ENCOUNTER — Other Ambulatory Visit: Payer: Self-pay | Admitting: Family Medicine

## 2018-12-13 ENCOUNTER — Other Ambulatory Visit: Payer: Self-pay | Admitting: Family Medicine

## 2019-01-05 ENCOUNTER — Ambulatory Visit: Payer: PPO

## 2019-01-10 ENCOUNTER — Telehealth: Payer: Self-pay | Admitting: Family Medicine

## 2019-01-10 DIAGNOSIS — E781 Pure hyperglyceridemia: Secondary | ICD-10-CM

## 2019-01-10 DIAGNOSIS — E039 Hypothyroidism, unspecified: Secondary | ICD-10-CM

## 2019-01-10 DIAGNOSIS — E119 Type 2 diabetes mellitus without complications: Secondary | ICD-10-CM

## 2019-01-10 DIAGNOSIS — I10 Essential (primary) hypertension: Secondary | ICD-10-CM

## 2019-01-10 NOTE — Telephone Encounter (Signed)
-----   Message from Ellamae Sia sent at 01/04/2019 10:37 AM EDT ----- Regarding: Lab orders for Tuesday, 9.8.20 Patient is scheduled for CPX labs, please order future labs, Thanks , Karna Christmas

## 2019-01-11 ENCOUNTER — Other Ambulatory Visit: Payer: Self-pay

## 2019-01-11 ENCOUNTER — Other Ambulatory Visit (INDEPENDENT_AMBULATORY_CARE_PROVIDER_SITE_OTHER): Payer: PPO

## 2019-01-11 DIAGNOSIS — E781 Pure hyperglyceridemia: Secondary | ICD-10-CM | POA: Diagnosis not present

## 2019-01-11 DIAGNOSIS — E039 Hypothyroidism, unspecified: Secondary | ICD-10-CM | POA: Diagnosis not present

## 2019-01-11 DIAGNOSIS — I10 Essential (primary) hypertension: Secondary | ICD-10-CM

## 2019-01-11 DIAGNOSIS — E119 Type 2 diabetes mellitus without complications: Secondary | ICD-10-CM

## 2019-01-11 LAB — COMPREHENSIVE METABOLIC PANEL
ALT: 15 U/L (ref 0–35)
AST: 18 U/L (ref 0–37)
Albumin: 4 g/dL (ref 3.5–5.2)
Alkaline Phosphatase: 36 U/L — ABNORMAL LOW (ref 39–117)
BUN: 13 mg/dL (ref 6–23)
CO2: 28 mEq/L (ref 19–32)
Calcium: 9.4 mg/dL (ref 8.4–10.5)
Chloride: 106 mEq/L (ref 96–112)
Creatinine, Ser: 0.91 mg/dL (ref 0.40–1.20)
GFR: 61.12 mL/min (ref >60.00)
Glucose, Bld: 146 mg/dL — ABNORMAL HIGH (ref 70–99)
Potassium: 4.3 mEq/L (ref 3.5–5.1)
Sodium: 140 mEq/L (ref 135–145)
Total Bilirubin: 0.5 mg/dL (ref 0.2–1.2)
Total Protein: 6.9 g/dL (ref 6.0–8.3)

## 2019-01-11 LAB — LIPID PANEL
Cholesterol: 108 mg/dL (ref 0–200)
HDL: 34.4 mg/dL — ABNORMAL LOW (ref >39.00)
LDL Cholesterol: 39 mg/dL (ref 0–99)
NonHDL: 73.76
Total CHOL/HDL Ratio: 3
Triglycerides: 176 mg/dL — ABNORMAL HIGH (ref 0.0–149.0)
VLDL: 35.2 mg/dL (ref 0.0–40.0)

## 2019-01-11 LAB — CBC WITH DIFFERENTIAL/PLATELET
Basophils Absolute: 0.1 10*3/uL (ref 0.0–0.1)
Basophils Relative: 0.8 % (ref 0.0–3.0)
Eosinophils Absolute: 0.5 10*3/uL (ref 0.0–0.7)
Eosinophils Relative: 6.3 % — ABNORMAL HIGH (ref 0.0–5.0)
HCT: 40.3 % (ref 36.0–46.0)
Hemoglobin: 13.6 g/dL (ref 12.0–15.0)
Lymphocytes Relative: 27.1 % (ref 12.0–46.0)
Lymphs Abs: 2 10*3/uL (ref 0.7–4.0)
MCHC: 33.8 g/dL (ref 30.0–36.0)
MCV: 90.7 fl (ref 78.0–100.0)
Monocytes Absolute: 0.5 10*3/uL (ref 0.1–1.0)
Monocytes Relative: 7.2 % (ref 3.0–12.0)
Neutro Abs: 4.2 10*3/uL (ref 1.4–7.7)
Neutrophils Relative %: 58.6 % (ref 43.0–77.0)
Platelets: 315 10*3/uL (ref 150.0–400.0)
RBC: 4.44 Mil/uL (ref 3.87–5.11)
RDW: 13 % (ref 11.5–15.5)
WBC: 7.2 10*3/uL (ref 4.0–10.5)

## 2019-01-11 LAB — HEMOGLOBIN A1C: Hgb A1c MFr Bld: 7.2 % — ABNORMAL HIGH (ref 4.6–6.5)

## 2019-01-11 LAB — TSH: TSH: 2.13 u[IU]/mL (ref 0.35–4.50)

## 2019-01-17 ENCOUNTER — Ambulatory Visit (INDEPENDENT_AMBULATORY_CARE_PROVIDER_SITE_OTHER): Payer: PPO | Admitting: Family Medicine

## 2019-01-17 ENCOUNTER — Other Ambulatory Visit: Payer: Self-pay

## 2019-01-17 ENCOUNTER — Encounter: Payer: Self-pay | Admitting: Family Medicine

## 2019-01-17 VITALS — BP 129/78 | HR 76 | Temp 97.6°F | Ht 65.75 in | Wt 180.6 lb

## 2019-01-17 DIAGNOSIS — Z23 Encounter for immunization: Secondary | ICD-10-CM | POA: Diagnosis not present

## 2019-01-17 DIAGNOSIS — E1169 Type 2 diabetes mellitus with other specified complication: Secondary | ICD-10-CM

## 2019-01-17 DIAGNOSIS — Z853 Personal history of malignant neoplasm of breast: Secondary | ICD-10-CM

## 2019-01-17 DIAGNOSIS — E785 Hyperlipidemia, unspecified: Secondary | ICD-10-CM | POA: Diagnosis not present

## 2019-01-17 DIAGNOSIS — E663 Overweight: Secondary | ICD-10-CM

## 2019-01-17 DIAGNOSIS — N6012 Diffuse cystic mastopathy of left breast: Secondary | ICD-10-CM

## 2019-01-17 DIAGNOSIS — E119 Type 2 diabetes mellitus without complications: Secondary | ICD-10-CM | POA: Diagnosis not present

## 2019-01-17 DIAGNOSIS — I10 Essential (primary) hypertension: Secondary | ICD-10-CM

## 2019-01-17 DIAGNOSIS — Z0000 Encounter for general adult medical examination without abnormal findings: Secondary | ICD-10-CM

## 2019-01-17 DIAGNOSIS — M8589 Other specified disorders of bone density and structure, multiple sites: Secondary | ICD-10-CM | POA: Diagnosis not present

## 2019-01-17 DIAGNOSIS — E039 Hypothyroidism, unspecified: Secondary | ICD-10-CM | POA: Diagnosis not present

## 2019-01-17 MED ORDER — ATORVASTATIN CALCIUM 10 MG PO TABS: 5.0000 mg | ORAL_TABLET | Freq: Every day | ORAL | 3 refills | Status: DC

## 2019-01-17 MED ORDER — METFORMIN HCL 500 MG PO TABS: ORAL_TABLET | ORAL | 3 refills | Status: DC

## 2019-01-17 MED ORDER — FENOFIBRATE 54 MG PO TABS: 54.0000 mg | ORAL_TABLET | Freq: Every day | ORAL | 3 refills | Status: DC

## 2019-01-17 MED ORDER — LEVOTHYROXINE SODIUM 125 MCG PO TABS: 125.0000 ug | ORAL_TABLET | Freq: Every day | ORAL | 3 refills | Status: DC

## 2019-01-17 MED ORDER — LOSARTAN POTASSIUM 25 MG PO TABS: 25.0000 mg | ORAL_TABLET | Freq: Every day | ORAL | 3 refills | Status: DC

## 2019-01-17 NOTE — Assessment & Plan Note (Signed)
Stable exam  Scar on L breast with scab but no redness

## 2019-01-17 NOTE — Assessment & Plan Note (Signed)
Lab Results  Component Value Date   HGBA1C 7.2 (H) 01/11/2019   This is up from 6.4 Was eating too many carbs Now back on track  Eye exam scheduled 11/20  Continue losartan and metformin and atorvastatin F/u planned

## 2019-01-17 NOTE — Assessment & Plan Note (Signed)
Disc goals for lipids and reasons to control them Rev last labs with pt Rev low sat fat diet in detail  Continues atorvastatin and diet  LDL low at 39- good  Also fenofibrate (trig 176)

## 2019-01-17 NOTE — Assessment & Plan Note (Signed)
Reviewed health habits including diet and exercise and skin cancer prevention Reviewed appropriate screening tests for age  Also reviewed health mt list, fam hx and immunization status , as well as social and family history   See HPI Labs reviewed  Flu vaccine given  Disc shingrix vaccine  dexa utd  Pt has adv directive No cognitive concerns  Rev hearing screen -no hearing c/o Eye exam planned for nov

## 2019-01-17 NOTE — Assessment & Plan Note (Signed)
Discussed how this problem influences overall health and the risks it imposes  Reviewed plan for weight loss with lower calorie diet (via better food choices and also portion control or program like weight watchers) and exercise building up to or more than 30 minutes 5 days per week including some aerobic activity    

## 2019-01-17 NOTE — Patient Instructions (Addendum)
Schedule your mammogram for October  If you are interested in the new shingles vaccine (Shingrix) - call your local pharmacy to check on coverage and availability  If affordable, get on a wait list at your pharmacy to get the vaccine.  Please work on your advance directive   Try to get most of your carbohydrates from produce (with the exception of white potatoes)  Eat less bread/pasta/rice/snack foods/cereals/sweets and other items from the middle of the grocery store (processed carbs)   Follow up in 6 months

## 2019-01-17 NOTE — Assessment & Plan Note (Signed)
bp in fair control at this time  BP Readings from Last 1 Encounters:  01/17/19 129/78   No changes needed Most recent labs reviewed  Disc lifstyle change with low sodium diet and exercise

## 2019-01-17 NOTE — Assessment & Plan Note (Signed)
dexa 10/19 rev Taking D and mvi with ca in diet  No falls or fractures  Good exercise
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
## 2019-01-17 NOTE — Assessment & Plan Note (Signed)
Hypothyroidism  Pt has no clinical changes No change in energy level/ hair or skin/ edema and no tremor Lab Results  Component Value Date   TSH 2.13 01/11/2019

## 2019-01-17 NOTE — Assessment & Plan Note (Signed)
Doing well /cancer free  Watching chronic mastitis at surgical site L breast  Had tamoxifen tx

## 2019-03-15 DIAGNOSIS — E119 Type 2 diabetes mellitus without complications: Secondary | ICD-10-CM | POA: Diagnosis not present

## 2019-03-15 DIAGNOSIS — H18593 Other hereditary corneal dystrophies, bilateral: Secondary | ICD-10-CM | POA: Diagnosis not present

## 2019-03-15 DIAGNOSIS — H25813 Combined forms of age-related cataract, bilateral: Secondary | ICD-10-CM | POA: Diagnosis not present

## 2019-03-15 LAB — HM DIABETES EYE EXAM

## 2019-03-22 ENCOUNTER — Encounter: Payer: Self-pay | Admitting: Family Medicine

## 2019-03-22 ENCOUNTER — Encounter: Payer: Self-pay | Admitting: *Deleted

## 2019-03-29 ENCOUNTER — Other Ambulatory Visit: Payer: Self-pay | Admitting: Family Medicine

## 2019-03-29 DIAGNOSIS — Z1231 Encounter for screening mammogram for malignant neoplasm of breast: Secondary | ICD-10-CM

## 2019-04-07 ENCOUNTER — Ambulatory Visit
Admission: RE | Admit: 2019-04-07 | Discharge: 2019-04-07 | Disposition: A | Discharge status: Home / Self Care | Payer: PPO | Source: Ambulatory Visit | Attending: Family Medicine | Admitting: Family Medicine

## 2019-04-07 ENCOUNTER — Other Ambulatory Visit: Payer: Self-pay

## 2019-04-07 DIAGNOSIS — Z1231 Encounter for screening mammogram for malignant neoplasm of breast: Secondary | ICD-10-CM

## 2019-04-08 ENCOUNTER — Ambulatory Visit: Payer: PPO

## 2019-05-20 ENCOUNTER — Ambulatory Visit: Payer: PPO

## 2019-06-25 ENCOUNTER — Ambulatory Visit: Payer: PPO | Attending: Internal Medicine

## 2019-06-25 DIAGNOSIS — Z23 Encounter for immunization: Secondary | ICD-10-CM | POA: Insufficient documentation

## 2019-06-25 NOTE — Progress Notes (Signed)
   Covid-19 Vaccination Clinic  Name:  Carla Mccoy    MRN: [id number] DOB: 03-23-1951  06/25/2019  Carla Mccoy was observed post Covid-19 immunization for 15 minutes without incidence. She was provided with Vaccine Information Sheet and instruction to access the V-Safe system.   Carla Mccoy was instructed to call 911 with any severe reactions post vaccine: [name] Difficulty breathing  . Swelling of your face and throat  . A fast heartbeat  . A bad rash all over your body  . Dizziness and weakness    Immunizations Administered    Name Date Dose VIS Date Route   Pfizer COVID-19 Vaccine 06/25/2019  2:33 PM 0.3 mL 04/15/2019 Intramuscular   Manufacturer: [place]   Lot: [id number]   [place]: [id number]

## 2019-07-19 ENCOUNTER — Ambulatory Visit: Payer: PPO | Attending: Internal Medicine

## 2019-07-19 DIAGNOSIS — Z23 Encounter for immunization: Secondary | ICD-10-CM

## 2019-07-19 NOTE — Progress Notes (Signed)
   Covid-19 Vaccination Clinic  Name:  Carla Mccoy    MRN: [id number] DOB: [birth year]  07/19/2019  Ms. Carla Mccoy was observed post Covid-19 immunization for 30 minutes based on pre-vaccination screening without incident. She was provided with Vaccine Information Sheet and instruction to access the V-Safe system.   Ms. Carla Mccoy was instructed to call 911 with any severe reactions post vaccine: [name] Difficulty breathing  . Swelling of face and throat  . A fast heartbeat  . A bad rash all over body  . Dizziness and weakness   Immunizations Administered    Name Date Dose VIS Date Route   Pfizer COVID-19 Vaccine 07/19/2019  9:45 AM 0.3 mL 04/15/2019 Intramuscular   Manufacturer: [place]   Lot: [id number]   [place]: [id number]

## 2019-07-22 ENCOUNTER — Other Ambulatory Visit: Payer: Self-pay

## 2019-07-22 ENCOUNTER — Ambulatory Visit (INDEPENDENT_AMBULATORY_CARE_PROVIDER_SITE_OTHER): Payer: PPO | Admitting: Family Medicine

## 2019-07-22 ENCOUNTER — Encounter: Payer: Self-pay | Admitting: Family Medicine

## 2019-07-22 VITALS — BP 132/70 | HR 70 | Temp 97.9°F | Ht 65.75 in | Wt 181.5 lb

## 2019-07-22 DIAGNOSIS — E119 Type 2 diabetes mellitus without complications: Secondary | ICD-10-CM | POA: Diagnosis not present

## 2019-07-22 DIAGNOSIS — E785 Hyperlipidemia, unspecified: Secondary | ICD-10-CM

## 2019-07-22 DIAGNOSIS — I10 Essential (primary) hypertension: Secondary | ICD-10-CM | POA: Diagnosis not present

## 2019-07-22 DIAGNOSIS — E1169 Type 2 diabetes mellitus with other specified complication: Secondary | ICD-10-CM | POA: Diagnosis not present

## 2019-07-22 LAB — HEMOGLOBIN A1C: Hgb A1c MFr Bld: 7.1 % — ABNORMAL HIGH (ref 4.6–6.5)

## 2019-07-22 LAB — COMPREHENSIVE METABOLIC PANEL
ALT: 14 U/L (ref 0–35)
AST: 17 U/L (ref 0–37)
Albumin: 4.1 g/dL (ref 3.5–5.2)
Alkaline Phosphatase: 42 U/L (ref 39–117)
BUN: 15 mg/dL (ref 6–23)
CO2: 27 mEq/L (ref 19–32)
Calcium: 9.9 mg/dL (ref 8.4–10.5)
Chloride: 105 mEq/L (ref 96–112)
Creatinine, Ser: 0.96 mg/dL (ref 0.40–1.20)
GFR: 57.38 mL/min — ABNORMAL LOW (ref >60.00)
Glucose, Bld: 143 mg/dL — ABNORMAL HIGH (ref 70–99)
Potassium: 4.5 mEq/L (ref 3.5–5.1)
Sodium: 138 mEq/L (ref 135–145)
Total Bilirubin: 0.5 mg/dL (ref 0.2–1.2)
Total Protein: 7.1 g/dL (ref 6.0–8.3)

## 2019-07-22 LAB — LIPID PANEL
Cholesterol: 126 mg/dL (ref 0–200)
HDL: 40.6 mg/dL (ref >39.00)
LDL Cholesterol: 54 mg/dL (ref 0–99)
NonHDL: 85.27
Total CHOL/HDL Ratio: 3
Triglycerides: 157 mg/dL — ABNORMAL HIGH (ref 0.0–149.0)
VLDL: 31.4 mg/dL (ref 0.0–40.0)

## 2019-07-22 NOTE — Assessment & Plan Note (Signed)
bp in fair control at this time  BP Readings from Last 1 Encounters:  07/22/19 132/70   No changes needed Most recent labs reviewed  Disc lifstyle change with low sodium diet and exercise  Taking losartan 25 mg  Lab today

## 2019-07-22 NOTE — Assessment & Plan Note (Signed)
Atorvastatin has caused some muscle pain but she wants to stick with it  Disc poss change to crestor in future  Disc goals for lipids and reasons to control them Rev last labs with pt Rev low sat fat diet in detail d Labs today

## 2019-07-22 NOTE — Assessment & Plan Note (Signed)
Labs today

## 2019-07-22 NOTE — Patient Instructions (Addendum)
Take care of yourself  Labs today   Don't over commit to things-you need more time to take care of yourself  Try to get most of your carbohydrates from produce (with the exception of white potatoes)  Eat less bread/pasta/rice/snack foods/cereals/sweets and other items from the middle of the grocery store (processed carbs)   Aim to exercise 5 days per week / consider adding some weights and yoga

## 2019-07-22 NOTE — Assessment & Plan Note (Signed)
A1C today  Diet not optimal /little time for self care (discussed this)  May have to inc metformin  On arb and statin  UTD eye care Good foot care

## 2019-07-22 NOTE — Progress Notes (Signed)
Subjective:    Patient ID: Carla Mccoy, female    DOB: 1951/03/08, 69 y.o.   MRN: [id number]  This visit occurred during the SARS-CoV-2 public health emergency.  Safety protocols were in place, including screening questions prior to the visit, additional usage of staff PPE, and extensive cleaning of exam room while observing appropriate contact time as indicated for disinfecting solutions.    HPI Pt presents for 6 mo f/u of chronic medical conditions  Doing ok  Still keeps a great grand daughter  Doing volunteer work and raking leaves lately  Helping build a new church     Wt Readings from Last 3 Encounters:  07/22/19 181 lb 8 oz (82.3 kg)  01/17/19 180 lb 9 oz (81.9 kg)  05/10/18 174 lb 8 oz (79.2 kg)  wants to loose some weight  Diet was ok until the last few months  -has had to convenience eat  No time for self care  29.52 kg/m   Had her covid vaccines   bp is stable today  No cp or palpitations or headaches or edema  No side effects to medicines  BP Readings from Last 3 Encounters:  07/22/19 132/70  01/17/19 129/78  05/10/18 128/72     Lab Results  Component Value Date   CREATININE 0.91 01/11/2019   BUN 13 01/11/2019   NA 140 01/11/2019   K 4.3 01/11/2019   CL 106 01/11/2019   CO2 28 01/11/2019   DM2 Lab Results  Component Value Date   HGBA1C 7.2 (H) 01/11/2019   Was eating too many carbs  lostartan and metformin and atorvastatin  Due for labs  Lab Results  Component Value Date   CHOL 108 01/11/2019   HDL 34.40 (L) 01/11/2019   LDLCALC 39 01/11/2019   LDLDIRECT 70.0 05/07/2016   TRIG 176.0 (H) 01/11/2019   CHOLHDL 3 01/11/2019   Taking atorvastatin - making her have some muscle pain  Also fenofibrate   Patient Active Problem List   Diagnosis Date Noted  . Plantar fasciitis of left foot 08/26/2018  . Estrogen deficiency 11/03/2017  . Osteopenia 11/03/2017  . Leg cramps 11/18/2016  . Family history of congenital heart disease 11/18/2016    . Arm pain, chronic 11/18/2016  . Cough due to ACE inhibitor 11/18/2016  . Routine general medical examination at a health care facility 03/26/2015  . Colon cancer screening 03/24/2014  . Encounter for Medicare annual wellness exam 03/24/2014  . Diabetes type 2, controlled ([place]) 03/17/2014  . Trigger finger, acquired 08/26/2013  . Benign paroxysmal positional vertigo 08/26/2013  . OSA (obstructive sleep apnea) 08/10/2013  . Chronic mastitis of left breast 07/04/2013  . History of breast cancer 02/10/2012  . 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 BACK PAIN, CHRONIC 05/10/2007  . PEPTIC ULCER DISEASE, HX OF 05/10/2007  . HYSTERECTOMY, HX OF 05/10/2007  . TONSILLECTOMY, HX OF 05/10/2007  . Hypothyroidism 01/28/2007  . Hyperlipidemia associated with type 2 diabetes mellitus ([place]) 01/28/2007  . Overweight 01/28/2007  . COMMON MIGRAINE 01/28/2007  . Essential hypertension 01/28/2007  . FATTY LIVER DISEASE 01/28/2007  . CARPAL TUNNEL SYNDROME, HX OF 01/27/2007   Past Medical History:  Diagnosis Date  . Allergy   . 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 Arthritis   . Backache, unspecified   . breast ca dx'd 06/2009   breast cancer left - lumpectomy  . Diabetes mellitus without complication ([place])   . Full dentures   . Migraine without  aura, without mention of intractable migraine without mention of status migrainosus   . Obesity, unspecified   . Other abnormal glucose   . Other and unspecified hyperlipidemia   . Other chronic nonalcoholic liver disease   . Personal history of peptic ulcer disease   . Personal history of radiation therapy   . 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 Sleep apnea   . Tachy-brady syndrome ([place])   . Unspecified essential hypertension 09/12/13   remote history; no current treatment  . Unspecified hypothyroidism   . Wears glasses    Past Surgical History:  Procedure Laterality Date  . ABDOMINAL  HYSTERECTOMY  1988  . BREAST BIOPSY Left    in the 80's - benign tissue  . BREAST LUMPECTOMY Left 2011   Feb '11  . Breast Surgery x 2 Left   . INCISION AND DRAINAGE OF WOUND Left 09/14/2013   Procedure: IRRIGATION AND DEBRIDEMENT LEFT BREAST ABSCESS;  Surgeon: Marcello Moores A. Cornett, MD;  Location: [facility];  Service: General;  Laterality: Left;  . INCISION AND DRAINAGE OF WOUND Left 12/21/2013   Procedure: DEBRIDEMENT OF LEFT BREAST WOUND WITH PLACEMENT OF A CELL;  Surgeon: Theodoro Kos, DO;  Location: [facility];  Service: Plastics;  Laterality: Left;  . LAPAROSCOPY ABDOMEN DIAGNOSTIC    . TONSILLECTOMY    . TUBAL LIGATION  1971   Social History   Tobacco Use  . Smoking status: Never Smoker  . Smokeless tobacco: Never Used  Substance Use Topics  . Alcohol use: No    Alcohol/week: 0.0 standard drinks  . Drug use: No   Family History  Problem Relation Age of Onset  . Peripheral vascular disease Mother   . Coronary artery disease Mother   . Hyperlipidemia Mother   . Heart disease Mother        CAD/fatal MI  . Diabetes Mother   . Stroke Mother   . Diabetes Sister   . Hypothyroidism Sister   . Diabetes Brother   . Hyperlipidemia Brother   . Heart disease Brother   . Hypertrophic cardiomyopathy Brother        congenital  . Colon cancer Neg Hx   . Esophageal cancer Neg Hx   . Pancreatic cancer Neg Hx   . Stomach cancer Neg Hx   . Rectal cancer Neg Hx   . Breast cancer Neg Hx    Allergies  Allergen Reactions  . Metoprolol     Lips swelled/ hands swelled and her skin turned red   . Sulfonamide Derivatives    Current Outpatient Medications on File Prior to Visit  Medication Sig Dispense Refill  . atorvastatin (LIPITOR) 10 MG tablet Take 0.5 tablets (5 mg total) by mouth daily. 45 tablet 3  . Cholecalciferol (VITAMIN D3) 2000 UNITS TABS Take by mouth.      . fenofibrate 54 MG tablet Take 1 tablet (54 mg total) by mouth daily. 90 tablet 3  .  levothyroxine (SYNTHROID) 125 MCG tablet Take 1 tablet (125 mcg total) by mouth daily. 90 tablet 3  . losartan (COZAAR) 25 MG tablet Take 1 tablet (25 mg total) by mouth daily. 90 tablet 3  . metFORMIN (GLUCOPHAGE) 500 MG tablet TAKE 1 TABLET BY MOUTH TWICE DAILY WITH A MEAL 180 tablet 3  . Multiple Vitamins-Minerals (MULTIVITAMIN,TX-MINERALS) tablet Take 1 tablet by mouth 2 (two) times a week.     . Omega-3 Fatty Acids (OMEGA 3  PO) Take 1 capsule by mouth 2 (two) times a week.     . vitamin C (ASCORBIC ACID) 500 MG tablet Take 500 mg by mouth daily.    . vitamin E 100 UNIT capsule Take 100 Units by mouth daily.      No current facility-administered medications on file prior to visit.     Review of Systems  Constitutional: Positive for fatigue. Negative for activity change, appetite change, fever and unexpected weight change.  HENT: Negative for congestion, ear pain, rhinorrhea, sinus pressure and sore throat.   Eyes: Negative for pain, redness and visual disturbance.  Respiratory: Negative for cough, shortness of breath and wheezing.   Cardiovascular: Negative for chest pain and palpitations.  Gastrointestinal: Negative for abdominal pain, blood in stool, constipation and diarrhea.  Endocrine: Negative for polydipsia and polyuria.  Genitourinary: Negative for dysuria, frequency and urgency.  Musculoskeletal: Positive for myalgias. Negative for arthralgias and back pain.       Mild muscle soreness from statin she suspects  This is worse after exercise -but tolerable  Skin: Negative for pallor and rash.  Allergic/Immunologic: Negative for environmental allergies.  Neurological: Negative for dizziness, syncope and headaches.  Hematological: Negative for adenopathy. Does not bruise/bleed easily.  Psychiatric/Behavioral: Negative for decreased concentration and dysphoric mood. The patient is not nervous/anxious.        Some fatigue from stressors and busy schedule       Objective:    Physical Exam Constitutional:      General: She is not in acute distress.    Appearance: Normal appearance. She is well-developed. She is not ill-appearing or diaphoretic.     Comments: Overweight -close to obesity  HENT:     Head: Normocephalic and atraumatic.  Eyes:     General: No scleral icterus.    Conjunctiva/sclera: Conjunctivae normal.     Pupils: Pupils are equal, round, and reactive to light.  Neck:     Thyroid: No thyromegaly.     Vascular: No carotid bruit or JVD.  Cardiovascular:     Rate and Rhythm: Normal rate and regular rhythm.     Heart sounds: Normal heart sounds. No gallop.   Pulmonary:     Effort: Pulmonary effort is normal. No respiratory distress.     Breath sounds: Normal breath sounds. No wheezing or rales.  Abdominal:     General: Bowel sounds are normal. There is no distension or abdominal bruit.     Palpations: Abdomen is soft. There is no mass.     Tenderness: There is no abdominal tenderness.  Musculoskeletal:     Cervical back: Normal range of motion and neck supple.     Right lower leg: No edema.     Left lower leg: No edema.  Lymphadenopathy:     Cervical: No cervical adenopathy.  Skin:    General: Skin is warm and dry.     Coloration: Skin is not pale.     Findings: No rash.  Neurological:     Mental Status: She is alert.     Deep Tendon Reflexes: Reflexes are normal and symmetric.  Psychiatric:        Mood and Affect: Mood normal.     Comments: Freely discusses stressors           Assessment & Plan:   Problem List Items Addressed This Visit      Cardiovascular and Mediastinum   Essential hypertension    bp in fair control at this time  BP  Readings from Last 1 Encounters:  07/22/19 132/70   No changes needed Most recent labs reviewed  Disc lifstyle change with low sodium diet and exercise  Taking losartan 25 mg  Lab today      Relevant Orders   Comprehensive metabolic panel     Endocrine   Hyperlipidemia associated  with type 2 diabetes mellitus (HCC)    Atorvastatin has caused some muscle pain but she wants to stick with it  Disc poss change to crestor in future  Disc goals for lipids and reasons to control them Rev last labs with pt Rev low sat fat diet in detail d Labs today       Relevant Orders   Lipid panel   Diabetes type 2, controlled ([place]) - Primary    A1C today  Diet not optimal /little time for self care (discussed this)  May have to inc metformin  On arb and statin  UTD eye care Good foot care       Relevant Orders   Hemoglobin A1c

## 2019-07-25 ENCOUNTER — Telehealth: Payer: Self-pay

## 2019-07-25 NOTE — Telephone Encounter (Signed)
LVM for pt to call for lab results and PCP msg.  Msg below from the PCP.   [name], [name], MD  07/24/2019 9:49 AM EDT    Cholesterol is stable  Continue working on diet and exercise  A1C is 7.1 -similar to last check (goal is under 7)  Does she want to go up on her metformin dose to better control sugar ?

## 2019-09-27 ENCOUNTER — Telehealth: Payer: Self-pay | Admitting: Family Medicine

## 2019-09-27 NOTE — Progress Notes (Signed)
°  Chronic Care Management   Note  09/27/2019 Name: LARISA GARSTKA MRN: [id number] DOB: 29-Aug-1950  [name] is a 70 y.o. year old female who is a primary care patient of [name], [name], MD. I reached out to [employer] by phone today in response to a referral sent by Ms. Lesle Chris Daigneault's PCP, [name], [name], MD.   Ms. Maltese was given information about Chronic Care Management services today including:  1. CCM service includes personalized support from designated clinical staff supervised by her physician, including individualized plan of care and coordination with other care providers 2. 24/7 contact phone numbers for assistance for urgent and routine care needs. 3. Service will only be billed when office clinical staff spend 20 minutes or more in a month to coordinate care. 4. Only one practitioner may furnish and bill the service in a calendar month. 5. The patient may stop CCM services at any time (effective at the end of the month) by phone call to the office staff.   Patient agreed to services and verbal consent obtained.  This note is not being shared with the patient for the following reason: To respect privacy (The patient or proxy has requested that the information not be shared). Follow up plan:  [facility] Upstream Scheduler

## 2019-11-29 ENCOUNTER — Telehealth: Payer: PPO

## 2019-12-19 ENCOUNTER — Telehealth: Payer: Self-pay

## 2019-12-19 NOTE — Chronic Care Management (AMB) (Deleted)
Chronic Care Management Pharmacy  Name: Carla Mccoy  MRN: [id number] DOB: Mar 05, 1951  Chief Complaint/ HPI  Carla Mccoy,  69 y.o. , female presents for their Initial CCM visit with the clinical pharmacist via telephone due to COVID-19 Pandemic.  PCP : Carla Greenspan, MD  Their chronic conditions include: hypertension, hypothyroidism, hyperlipidemia, diabetes, osteopenia, chronic back pain, peptic ulcer disease.   Office Visits:*** 07/22/2019 - diet not optimal. May need to increase metformin.  06/25/2019 and 07/19/2019 - COVID vaccine.  Consult Visit:***  Medications: Outpatient Encounter Medications as of 12/19/2019  Medication Sig  . atorvastatin (LIPITOR) 10 MG tablet Take 0.5 tablets (5 mg total) by mouth daily.  . Cholecalciferol (VITAMIN D3) 2000 UNITS TABS Take by mouth.    . fenofibrate 54 MG tablet Take 1 tablet (54 mg total) by mouth daily.  [name] levothyroxine (SYNTHROID) 125 MCG tablet Take 1 tablet (125 mcg total) by mouth daily.  [name] losartan (COZAAR) 25 MG tablet Take 1 tablet (25 mg total) by mouth daily.  . metFORMIN (GLUCOPHAGE) 500 MG tablet TAKE 1 TABLET BY MOUTH TWICE DAILY WITH A MEAL  . Multiple Vitamins-Minerals (MULTIVITAMIN,TX-MINERALS) tablet Take 1 tablet by mouth 2 (two) times a week.   . Omega-3 Fatty Acids (OMEGA 3 PO) Take 1 capsule by mouth 2 (two) times a week.   . vitamin C (ASCORBIC ACID) 500 MG tablet Take 500 mg by mouth daily.  . vitamin E 100 UNIT capsule Take 100 Units by mouth daily.    No facility-administered encounter medications on file as of 12/19/2019.     Current Diagnosis/Assessment:  Goals Addressed   None    Hyperlipidemia   LDL goal < ***  Lipid Panel     Component Value Date/Time   CHOL 126 07/22/2019 0856   TRIG 157.0 (H) 07/22/2019 0856   TRIG 190 (H) 03/06/2006 1318   HDL 40.60 07/22/2019 0856   LDLCALC 54 07/22/2019 0856   LDLDIRECT 70.0 05/07/2016 0804    Hepatic Function Latest Ref Rng & Units  07/22/2019 01/11/2019 05/07/2018  Total Protein 6.0 - 8.3 g/dL 7.1 6.9 7.2  Albumin 3.5 - 5.2 g/dL 4.1 4.0 4.2  AST 0 - 37 U/L 17 18 15   ALT 0 - 35 U/L 14 15 14   Alk Phosphatase 39 - 117 U/L 42 36(L) 36(L)  Total Bilirubin 0.2 - 1.2 mg/dL 0.5 0.5 0.4  Bilirubin, Direct 0.0 - 0.3 mg/dL - - -     The ASCVD Risk score Mikey Bussing DC Jr., et al., 2013) failed to calculate for the following reasons:   The valid total cholesterol range is 130 to 320 mg/dL   Patient has failed these meds in past: *** Patient is currently {CHL Controlled/Uncontrolled:501 106 8295} on the following medications:  . atovastatin 5 mg daily . Fenofibrate 54 mg daily . Omega 3 2 times a week  We discussed:  {CHL HP Upstream Pharmacy discussion:[phone]}  Plan  Continue {CHL HP Upstream Pharmacy Plans:5187585115}   Diabetes   Recent Relevant Labs: Lab Results  Component Value Date/Time   HGBA1C 7.1 (H) 07/22/2019 08:56 AM   HGBA1C 7.2 (H) 01/11/2019 08:15 AM   MICROALBUR <0.7 11/02/2015 08:01 AM     Checking BG: {CHL HP Blood Glucose Monitoring Frequency:[phone]}  Recent FBG Readings: Recent pre-meal BG readings: *** Recent 2hr PP BG readings:  *** Recent HS BG readings: *** Patient has failed these meds in past: *** Patient is currently {CHL Controlled/Uncontrolled:501 106 8295} on the following medications: ***  Metformin  500 mg twice daily  Last diabetic Foot exam:  Lab Results  Component Value Date/Time   HMDIABEYEEXA No Retinopathy 03/15/2019 12:00 AM   HMDIABEYEEXA No Retinopathy 03/15/2019 12:00 AM    Last diabetic Eye exam: No results found for: HMDIABFOOTEX   We discussed: {CHL HP Upstream Pharmacy discussion:6697045323}  Plan  Continue {CHL HP Upstream Pharmacy Plans:[phone]} and  Hypertension   BP today is:  {CHL HP UPSTREAM Pharmacist BP ranges:[phone]}  Office blood pressures are  BP Readings from Last 3 Encounters:  07/22/19 132/70  01/17/19 129/78  05/10/18 128/72     Patient has failed these meds in the past: lisinopril Patient is currently controlled/uncontrolled*** on the following medications: ***  Losartan 25 mg daily Patient checks BP at home {CHL HP BP Monitoring Frequency:[phone]}  Patient home BP readings are ranging: ***  We discussed {CHL HP Upstream Pharmacy discussion:6697045323}  Plan  Continue {CHL HP Upstream Pharmacy Plans:[phone]}     Hypothyroidism   Lab Results  Component Value Date/Time   TSH 2.13 01/11/2019 08:15 AM   TSH 2.21 10/19/2017 10:21 AM   FREET4 1.18 03/22/2013 11:58 AM   FREET4 0.77 08/08/2010 02:23 PM    Patient has failed these meds in past: *** Patient is currently {CHL Controlled/Uncontrolled:[phone]} on the following medications:  . Levothyroxine 125 mcg daily  We discussed:  {CHL HP Upstream Pharmacy discussion:6697045323}  Plan  Continue {CHL HP Upstream Pharmacy Plans:[phone]}  Osteopenia   Last DEXA Scan: 02/12/2018  T-Score femoral neck: -1.7  T-Score total hip: ***  T-Score lumbar spine: -1.5  T-Score forearm radius: ***  10-year probability of major osteoporotic fracture: 10.2  10-year probability of hip fracture: 1.5%  Vit D, 25-Hydroxy  Date Value Ref Range Status  01/23/2011 41 30 - 89 ng/mL Final    Comment:    This assay accurately quantifies Vitamin D, which is the sum of the25-Hydroxy forms of Vitamin D2 and D3.  Studies have shown that theoptimum concentration of 25-Hydroxy Vitamin D is 30 ng/mL or higher. Concentrations of Vitamin D between 20 and 29 ng/mL  are considered tobe insufficient and concentrations less than 20 ng/mL are consideredto be deficient for Vitamin D.     Patient is not a candidate for pharmacologic treatment  Patient has failed these meds in past: *** Patient is currently {CHL Controlled/Uncontrolled:[phone]} on the following medications:  [name] Vitamin D 2000 units ***  We discussed:  {Osteoporosis  Counseling:23892}  Plan  Continue {CHL HP Upstream Pharmacy Plans:[phone]}   Health Maintenance   Patient is currently {CHL Controlled/Uncontrolled:[phone]} on the following medications:  [name] Multivitamin *** . Vitamin C 500 mg daily . Vitamin E 100 unit daily  We discussed:  ***  Plan  Continue {CHL HP Upstream Pharmacy UDJSH:[id number]}  Vaccines   Reviewed and discussed patient's vaccination history.    Immunization History  Administered Date(s) Administered  . Fluad Quad(high Dose 65+) 01/17/2019  . H1N1 03/06/2008  . Influenza Whole 03/06/2008  . Influenza, High Dose Seasonal PF 03/28/2016  . Influenza,inj,Quad PF,6+ Mos 03/24/2014, 03/26/2015, 03/10/2017  . Influenza-Unspecified 02/02/2013, 02/18/2018  . PFIZER SARS-COV-2 Vaccination 06/25/2019, 07/19/2019  . Pneumococcal Conjugate-13 03/22/2013  . Pneumococcal Polysaccharide-23 09/02/2011, 03/10/2017  . Td 08/07/2008    Plan  Recommended patient receive *** vaccine in *** office.   Medication Management   Pt uses [facility]  pharmacy for all medications Uses pill box? {Yes or If no, why not?:20788} Pt endorses ***% compliance  We discussed: ***  Plan  {[facility] Pharm[facility]  SSQS:47158}    Follow up: *** month phone visit  ***

## 2019-12-19 NOTE — Telephone Encounter (Signed)
I spoke with pt; pts have appts today and tomorrow and it may be Wed before goes to UC. Because pt is concerned about pneumonia; I advised pt should go to UC for provider to listen to lungs to eval pt and get CXR if necessary. Pt said she will try to this afternoon. FYI to Dr Glori Bickers. Pt is going to talk with her daughter that is a nurse to decide what UC she will go to.

## 2019-12-19 NOTE — Telephone Encounter (Signed)
[facility] - Client TELEPHONE ADVICE RECORD [facility] Patient Name: Carla Mccoy Gender: Female DOB: 1950-05-31 Age: 69 Y 10 M 25 D Return Phone Number: [phone] (Primary) Address: City/State/Zip: [place] [address] Client [facility] - Client Client Site [facility] Physician Glori Bickers, Roque Lias - MD Contact Type Call Who Is Calling Patient / Member / Family / Caregiver Call Type Triage / Clinical Relationship To Patient Self Return Phone Number [phone] (Primary) Chief Complaint Cough Reason for Call Symptomatic / Request for [place] states she has a cough and lung pain. Translation No Nurse Assessment Nurse: Leilani Merl, RN, Heather Date/Time (Eastern Time): 12/19/2019 12:40:31 PM Confirm and document reason for call. If symptomatic, describe symptoms. ---Caller states that she has been coughing at night for the last 2 months, it is getting worse. Has the patient had close contact with a person known or suspected to have the novel coronavirus illness OR traveled / lives in area with major community spread (including international travel) in the last 14 days from the onset of symptoms? * If Asymptomatic, screen for exposure and travel within the last 14 days. ---No Does the patient have any new or worsening symptoms? ---Yes Will a triage be completed? ---Yes Related visit to physician within the last 2 weeks? ---No Does the PT have any chronic conditions? (i.e. diabetes, asthma, this includes High risk factors for pregnancy, etc.) ---Unknown Is this a behavioral health or substance abuse call? ---No Guidelines Guideline Title Affirmed Question Affirmed Notes Nurse Date/Time (Eastern Time) COVID-19 - Diagnosed or Suspected [1] HIGH RISK patient (e.g., age > 5 years, diabetes, heart or lung disease, weak immune system) AND [2] new or worsening  symptoms Standifer, RN, Nira Conn 12/19/2019 12:41:24 PM Disp. Time Eilene Ghazi Time) Disposition Final User 12/19/2019 12:50:40 PM Call PCP Now Yes Standifer, RN, Heather PLEASE NOTE: All timestamps contained within this report are represented as [place] Standard Time. CONFIDENTIALTY NOTICE: This fax transmission is intended only for the addressee. It contains information that is legally privileged, confidential or otherwise protected from use or disclosure. If you are not the intended recipient, you are strictly prohibited from reviewing, disclosing, copying using or disseminating any of this information or taking any action in reliance on or regarding this information. If you have received this fax in error, please notify us immediately by telephone so that we can arrange for its return to [name]. Phone: [phone], Toll-Free: [phone], Fax: [phone] Page: 2 of 2 Call Id: [id number] [place] Disagree/Comply Comply Caller Understands Yes PreDisposition Call Doctor Care Advice Given Per Guideline CALL PCP NOW: * You need to discuss this with your doctor (or NP/PA). CARE ADVICE given per COVID-19 - DIAGNOSED OR SUSPECTED (Adult) guideline. Comments User: Ave Filter, RN Date/Time Eilene Ghazi Time): 12/19/2019 12:52:18 PM Called the backline, they do not have any appts for today. Told caller that she needs to go to an [facility]. Caller verbalizes understanding. Referrals GO TO FACILITY UNDECIDED

## 2019-12-19 NOTE — Telephone Encounter (Signed)
Agree with that advisement , will watch for correspondence

## 2019-12-20 DIAGNOSIS — J209 Acute bronchitis, unspecified: Secondary | ICD-10-CM | POA: Diagnosis not present

## 2019-12-22 DIAGNOSIS — M7712 Lateral epicondylitis, left elbow: Secondary | ICD-10-CM | POA: Diagnosis not present

## 2019-12-23 ENCOUNTER — Ambulatory Visit: Payer: PPO

## 2019-12-23 ENCOUNTER — Other Ambulatory Visit: Payer: Self-pay

## 2019-12-23 DIAGNOSIS — E785 Hyperlipidemia, unspecified: Secondary | ICD-10-CM

## 2019-12-23 DIAGNOSIS — I10 Essential (primary) hypertension: Secondary | ICD-10-CM

## 2019-12-23 DIAGNOSIS — E119 Type 2 diabetes mellitus without complications: Secondary | ICD-10-CM

## 2019-12-23 DIAGNOSIS — E1169 Type 2 diabetes mellitus with other specified complication: Secondary | ICD-10-CM

## 2019-12-23 NOTE — Chronic Care Management (AMB) (Signed)
Chronic Care Management Pharmacy  Name: Carla Mccoy  MRN: [id number] DOB: April 15, 1951  Chief Complaint/ HPI  Carla Mccoy,  69 y.o. , female presents for their Initial CCM visit with the clinical pharmacist via telephone due to COVID-19 Pandemic.  PCP : Abner Greenspan, MD  Their chronic conditions include: hypertension, hypothyroidism, hyperlipidemia, diabetes, osteopenia, chronic back pain, peptic ulcer disease.   Office Visits: 07/22/2019 - diet not optimal. May need to increase metformin.  06/25/2019 and 07/19/2019 - COVID vaccine.  Consult Visit:  Medications: Outpatient Encounter Medications as of 12/23/2019  Medication Sig  . atorvastatin (LIPITOR) 10 MG tablet Take 0.5 tablets (5 mg total) by mouth daily.  . Cholecalciferol (VITAMIN D3) 2000 UNITS TABS Take by mouth.    . fenofibrate 54 MG tablet Take 1 tablet (54 mg total) by mouth daily.  [name] levothyroxine (SYNTHROID) 125 MCG tablet Take 1 tablet (125 mcg total) by mouth daily.  [name] losartan (COZAAR) 25 MG tablet Take 1 tablet (25 mg total) by mouth daily.  . Magnesium 400 MG CAPS Take 1 tablet by mouth 3 (three) times a week.  . metFORMIN (GLUCOPHAGE) 500 MG tablet TAKE 1 TABLET BY MOUTH TWICE DAILY WITH A MEAL  . Multiple Vitamins-Minerals (MULTIVITAMIN,TX-MINERALS) tablet Take 1 tablet by mouth 2 (two) times a week.   . Omega-3 Fatty Acids (OMEGA 3 PO) Take 1 capsule by mouth 2 (two) times a week.   . Vitamin E 400 units TABS Take 800 Units by mouth daily.   [name] zinc gluconate 50 MG tablet Take 50 mg by mouth 2 (two) times a week.  . vitamin C (ASCORBIC ACID) 500 MG tablet Take 500 mg by mouth daily. (Patient not taking: Reported on 12/23/2019)   No facility-administered encounter medications on file as of 12/23/2019.   Allergies  Allergen Reactions  . Metoprolol     Lips swelled/ hands swelled and her skin turned red   . Sulfonamide Derivatives    SDOH Screenings   Alcohol Screen:   . Last Alcohol Screening  Score (AUDIT): Not on file  Depression (PHQ2-9): Low Risk   . PHQ-2 Score: 0  Financial Resource Strain:   . Difficulty of Paying Living Expenses: Not on file  Food Insecurity: No Food Insecurity  . Worried About Charity fundraiser in the Last Year: Never true  . Ran Out of Food in the Last Year: Never true  Housing: Low Risk   . Last Housing Risk Score: 0  Physical Activity:   . Days of Exercise per Week: Not on file  . Minutes of Exercise per Session: Not on file  Social Connections:   . Frequency of Communication with Friends and Family: Not on file  . Frequency of Social Gatherings with Friends and Family: Not on file  . Attends Religious Services: Not on file  . Active Member of Clubs or Organizations: Not on file  . Attends Archivist Meetings: Not on file  . Marital Status: Not on file  Stress:   . Feeling of Stress : Not on file  Tobacco Use: Low Risk   . Smoking Tobacco Use: Never Smoker  . Smokeless Tobacco Use: Never Used  Transportation Needs: No Transportation Needs  . Lack of Transportation (Medical): No  . Lack of Transportation (Non-Medical): No     Current Diagnosis/Assessment:  Goals Addressed            This Visit's Progress   . Pharmacy Care Plan  CARE PLAN ENTRY (see longitudinal plan of care for additional care plan information)  Current Barriers:  . Chronic Disease Management support, education, and care coordination needs related to Hypertension, Hyperlipidemia, and Diabetes   Hypertension BP Readings from Last 3 Encounters:  07/22/19 132/70  01/17/19 129/78  05/10/18 128/72   . Pharmacist Clinical Goal(s): o Over the next 90 days, patient will work with PharmD and providers to maintain BP goal <130/80 . Current regimen:  o Losartan 25 mg daily . Interventions: o Recommended begin checking blood pressure twice weekly. Discussed potential of increasing losartan if home readings remain above goal.  . Patient self care  activities - Over the next 90 days, patient will: o Check BP twice weekly , document, and provide at future appointments o Ensure daily salt intake < 2300 mg/day  Hyperlipidemia Lab Results  Component Value Date/Time   LDLCALC 54 07/22/2019 08:56 AM   LDLDIRECT 70.0 05/07/2016 08:04 AM   . Pharmacist Clinical Goal(s): o Over the next 90 days, patient will work with PharmD and providers to maintain LDL goal < 70 . Current regimen:  o Atorvastatin 5 mg daily o Fenofibrate 54 mg daily o Omega 3 twice weekly . Interventions: o Recommend continuing active lifestyle and avoiding fried or fatty foods for optimal cholesterol management.  . Patient self care activities - Over the next 90 days, patient will: o Continue to take medications as prescribed.   Diabetes Lab Results  Component Value Date/Time   HGBA1C 7.1 (H) 07/22/2019 08:56 AM   HGBA1C 7.2 (H) 01/11/2019 08:15 AM   . Pharmacist Clinical Goal(s): o Over the next 90 days, patient will work with PharmD and providers to achieve A1c goal <7% . Current regimen:  o Metformin 500 mg twice daily . Interventions: o Discussed patient's personal preference to get A1c back down below 7. Patient feels that being at home more during 2020 caused her A1c to be higher than normal. She is due for a follow-up visit in September.  . Patient self care activities - Over the next 90 days, patient will: o Continue to take medications as prescribed.   Medication management . Pharmacist Clinical Goal(s): o Over the next 90 days, patient will work with PharmD and providers to maintain optimal medication adherence . Current pharmacy: [facility]  . Interventions o Comprehensive medication review performed. o Continue current medication management strategy' o Recommended patient get updated tetanus shot and consider Shingrix vaccine.  . Patient self care activities - Over the next 90 days, patient will: o Focus on medication adherence by continuing  to use a pill box.  o Take medications as prescribed o Report any questions or concerns to PharmD and/or provider(s)  Initial goal documentation       Hyperlipidemia   LDL goal < 70  Lipid Panel     Component Value Date/Time   CHOL 126 07/22/2019 0856   TRIG 157.0 (H) 07/22/2019 0856   TRIG 190 (H) 03/06/2006 1318   HDL 40.60 07/22/2019 0856   LDLCALC 54 07/22/2019 0856   LDLDIRECT 70.0 05/07/2016 0804    Hepatic Function Latest Ref Rng & Units 07/22/2019 01/11/2019 05/07/2018  Total Protein 6.0 - 8.3 g/dL 7.1 6.9 7.2  Albumin 3.5 - 5.2 g/dL 4.1 4.0 4.2  AST 0 - 37 U/L 17 18 15   ALT 0 - 35 U/L 14 15 14   Alk Phosphatase 39 - 117 U/L 42 36(L) 36(L)  Total Bilirubin 0.2 - 1.2 mg/dL 0.5 0.5 0.4  Bilirubin,  Direct 0.0 - 0.3 mg/dL - - -     The ASCVD Risk score Mikey Bussing DC Jr., et al., 2013) failed to calculate for the following reasons:   The valid total cholesterol range is 130 to 320 mg/dL   Patient has failed these meds in past: n/a Patient is currently controlled on the following medications:  . atovastatin 5 mg daily . Fenofibrate 54 mg daily . Omega 3 2 times a week  We discussed:  diet and exercise extensively. Discussed avoiding fried and fatty foods to improve triglycerides.   Plan  Continue current medications   Diabetes   Recent Relevant Labs: Lab Results  Component Value Date/Time   HGBA1C 7.1 (H) 07/22/2019 08:56 AM   HGBA1C 7.2 (H) 01/11/2019 08:15 AM   MICROALBUR <0.7 11/02/2015 08:01 AM     Checking BG: Never  Patient has failed these meds in past: n/a Patient is currently controlled on the following medications:   Metformin 500 mg twice daily  Last diabetic Foot exam:  Lab Results  Component Value Date/Time   HMDIABEYEEXA No Retinopathy 03/15/2019 12:00 AM   HMDIABEYEEXA No Retinopathy 03/15/2019 12:00 AM    Last diabetic Eye exam: No results found for: HMDIABFOOTEX   We discussed: diet and exercise extensively. Had previously been keeping  a1c around 6.9% before staying home during [place] more. Patient has tennis elbow currently from long hours working in [employer]. Was doing Charlean Sanfilippo workouts at home before her elbow flared up. Works in [place], Management consultant and planting trees. Patient is working to get her A1c below 7% again. She is due to follow-up 09/21. If A1c increased at that time, consider increasing metformin.   Plan  Continue current medications and control with diet and exercise and  Hypertension   BP today is:  <130/80  Office blood pressures are  BP Readings from Last 3 Encounters:  07/22/19 132/70  01/17/19 129/78  05/10/18 128/72    Patient has failed these meds in the past: lisinopril Patient is currently controlled on the following medications:   Losartan 25 mg daily  Patient checks BP at home infrequently  Patient home BP readings are ranging: ~140/80  We discussed diet and exercise extensively. BP was elevated at urgent care this week but otherwise never that high. Checked it at home later that day and was ~140/80 mmHg.  Discussed the importance of good blood pressure control and the ability to increase losartan if needed.   Plan  Continue current medications. Begin checking blood pressure twice a week and record to provide at future visits.     Hypothyroidism   Lab Results  Component Value Date/Time   TSH 2.13 01/11/2019 08:15 AM   TSH 2.21 10/19/2017 10:21 AM   FREET4 1.18 03/22/2013 11:58 AM   FREET4 0.77 08/08/2010 02:23 PM    Patient has failed these meds in past: n/a Patient is currently controlled on the following medications:  . Levothyroxine 125 mcg daily  We discussed: Takes levothyroxine in the morning before food as directed. No complaints of symptoms at this time.   Plan  Continue current medications  Osteopenia   Last DEXA Scan: 02/12/2018  T-Score femoral neck: -1.7  T-Score lumbar spine: -1.5  10-year probability of major osteoporotic  fracture: 10.2  10-year probability of hip fracture: 1.5%  Vit D, 25-Hydroxy  Date Value Ref Range Status  01/23/2011 41 30 - 89 ng/mL Final    Comment:    This assay accurately quantifies Vitamin  D, which is the sum of the25-Hydroxy forms of Vitamin D2 and D3.  Studies have shown that theoptimum concentration of 25-Hydroxy Vitamin D is 30 ng/mL or higher. Concentrations of Vitamin D between 20 and 29 ng/mL  are considered tobe insufficient and concentrations less than 20 ng/mL are consideredto be deficient for Vitamin D.     Patient is not a candidate for pharmacologic treatment  Patient has failed these meds in past: n/a  Patient is currently controlled on the following medications:  [name] Vitamin D 2000 units daily  We discussed:  Recommend [phone] units of vitamin D daily. Recommend 1200 mg of calcium daily from dietary and supplemental sources. Recommend weight-bearing and muscle strengthening exercises for building and maintaining bone density. Women's multivitamin has 100 mg calcium in daily dose. Eats cereal with milk at breakfast with orange juice with calcium. Eats cheese often with lunch. Will eat cheese on a salad with a meal. She eats yogurt 1-2 times a week.   Plan  Continue current medications   Health Maintenance   Patient is currently controlled on the following medications:  [name] Multivitamin supplementation . Zinc 2 times weekly - immune support . Vitamin E 100 unit daily - supplementation . Magnesium 400 mg three times weekly - leg cramps  We discussed: Drinks 8 cups of water each day and has leg cramps frequently. Magnesium and Theraworx seems to help.    Plan  Continue current medications  Vaccines   Reviewed and discussed patient's vaccination history. Does not think she has had Shingrix shots at [facility]. Discussed the benefits of Shingrix shot. Patient also denies a recent tetanus shot and last one in chart was in 2010.    Immunization History    Administered Date(s) Administered  . Fluad Quad(high Dose 65+) 01/17/2019  . H1N1 03/06/2008  . Influenza Whole 03/06/2008  . Influenza, High Dose Seasonal PF 03/28/2016  . Influenza,inj,Quad PF,6+ Mos 03/24/2014, 03/26/2015, 03/10/2017  . Influenza-Unspecified 02/02/2013, 02/18/2018  . PFIZER SARS-COV-2 Vaccination 06/25/2019, 07/19/2019  . Pneumococcal Conjugate-13 03/22/2013  . Pneumococcal Polysaccharide-23 09/02/2011, 03/10/2017  . Td 08/07/2008    Plan  Recommended patient receive annual flu vaccine in office.   Medication Management   Pt uses [facility]  pharmacy for all medications Uses pill box? Yes Pt endorses 100% compliance  We discussed: Patient likes to fill up her pill box each weekend and wants to continue to do so to keep her mind sharp. She denies any issues with affordability. Her fill history indicates good compliance with regimen. Patient and her husband help keep each other reminded on taking medication.   Plan  Continue current medication management strategy    Follow up: 3 month phone visit

## 2019-12-23 NOTE — Patient Instructions (Addendum)
Visit Information  Thank you for your time discussing your medications. I look forward to working with you to achieve your health care goals. Below is a summary of what we talked about during our visit.   Goals Addressed            This Visit's Progress   . Pharmacy Care Plan       CARE PLAN ENTRY (see longitudinal plan of care for additional care plan information)  Current Barriers:  . Chronic Disease Management support, education, and care coordination needs related to Hypertension, Hyperlipidemia, and Diabetes   Hypertension BP Readings from Last 3 Encounters:  07/22/19 132/70  01/17/19 129/78  05/10/18 128/72   . Pharmacist Clinical Goal(s): o Over the next 90 days, patient will work with PharmD and providers to maintain BP goal <130/80 . Current regimen:  o Losartan 25 mg daily . Interventions: o Recommended begin checking blood pressure twice weekly. Discussed potential of increasing losartan if home readings remain above goal.  . Patient self care activities - Over the next 90 days, patient will: o Check BP twice weekly , document, and provide at future appointments o Ensure daily salt intake < 2300 mg/day  Hyperlipidemia Lab Results  Component Value Date/Time   LDLCALC 54 07/22/2019 08:56 AM   LDLDIRECT 70.0 05/07/2016 08:04 AM   . Pharmacist Clinical Goal(s): o Over the next 90 days, patient will work with PharmD and providers to maintain LDL goal < 70 . Current regimen:  o Atorvastatin 5 mg daily o Fenofibrate 54 mg daily o Omega 3 twice weekly . Interventions: o Recommend continuing active lifestyle and avoiding fried or fatty foods for optimal cholesterol management.  . Patient self care activities - Over the next 90 days, patient will: o Continue to take medications as prescribed.   Diabetes Lab Results  Component Value Date/Time   HGBA1C 7.1 (H) 07/22/2019 08:56 AM   HGBA1C 7.2 (H) 01/11/2019 08:15 AM   . Pharmacist Clinical Goal(s): o Over the  next 90 days, patient will work with PharmD and providers to achieve A1c goal <7% . Current regimen:  o Metformin 500 mg twice daily . Interventions: o Discussed patient's personal preference to get A1c back down below 7. Patient feels that being at home more during 2020 caused her A1c to be higher than normal. She is due for a follow-up visit in September.  . Patient self care activities - Over the next 90 days, patient will: o Continue to take medications as prescribed.   Medication management . Pharmacist Clinical Goal(s): o Over the next 90 days, patient will work with PharmD and providers to maintain optimal medication adherence . Current pharmacy: [facility]  . Interventions o Comprehensive medication review performed. o Continue current medication management strategy' o Recommended patient get updated tetanus shot and consider Shingrix vaccine.  . Patient self care activities - Over the next 90 days, patient will: o Focus on medication adherence by continuing to use a pill box.  o Take medications as prescribed o Report any questions or concerns to PharmD and/or provider(s)  Initial goal documentation        Carla Mccoy was given information about Chronic Care Management services today including:  1. CCM service includes personalized support from designated clinical staff supervised by her physician, including individualized plan of care and coordination with other care providers 2. 24/7 contact phone numbers for assistance for urgent and routine care needs. 3. Standard insurance, coinsurance, copays and deductibles apply for chronic  care management only during months in which we provide at least 20 minutes of these services. Most insurances cover these services at 100%, however patients may be responsible for any copay, coinsurance and/or deductible if applicable. This service may help you avoid the need for more expensive face-to-face services. 4. Only one practitioner may  furnish and bill the service in a calendar month. 5. The patient may stop CCM services at any time (effective at the end of the month) by phone call to the office staff.  Patient agreed to services and verbal consent obtained.   The patient verbalized understanding of instructions provided today and agreed to receive a mailed copy of patient instruction and/or educational materials. Telephone follow up appointment with pharmacy team member scheduled for: 06/2020  Sherre Poot, PharmD Clinical Pharmacist [facility] [phone] (office) [phone] (mobile)  DASH Eating Plan DASH stands for "Dietary Approaches to Stop Hypertension." The DASH eating plan is a healthy eating plan that has been shown to reduce high blood pressure (hypertension). It may also reduce your risk for type 2 diabetes, heart disease, and stroke. The DASH eating plan may also help with weight loss. What are tips for following this plan?  General guidelines  Avoid eating more than 2,300 mg (milligrams) of salt (sodium) a day. If you have hypertension, you may need to reduce your sodium intake to 1,500 mg a day.  Limit alcohol intake to no more than 1 drink a day for nonpregnant women and 2 drinks a day for men. One drink equals 12 oz of beer, 5 oz of wine, or 1 oz of hard liquor.  Work with your health care provider to maintain a healthy body weight or to lose weight. Ask what an ideal weight is for you.  Get at least 30 minutes of exercise that causes your heart to beat faster (aerobic exercise) most days of the week. Activities may include walking, swimming, or biking.  Work with your health care provider or diet and nutrition specialist (dietitian) to adjust your eating plan to your individual calorie needs. Reading food labels   Check food labels for the amount of sodium per serving. Choose foods with less than 5 percent of the Daily Value of sodium. Generally, foods with less than 300 mg of  sodium per serving fit into this eating plan.  To find whole grains, look for the word "whole" as the first word in the ingredient list. Shopping  Buy products labeled as "low-sodium" or "no salt added."  Buy fresh foods. Avoid canned foods and premade or frozen meals. Cooking  Avoid adding salt when cooking. Use salt-free seasonings or herbs instead of table salt or sea salt. Check with your health care provider or pharmacist before using salt substitutes.  Do not fry foods. Cook foods using healthy methods such as baking, boiling, grilling, and broiling instead.  Cook with heart-healthy oils, such as olive, canola, soybean, or sunflower oil. Meal planning  Eat a balanced diet that includes: ? 5 or more servings of fruits and vegetables each day. At each meal, try to fill half of your plate with fruits and vegetables. ? Up to 6-8 servings of whole grains each day. ? Less than 6 oz of lean meat, poultry, or fish each day. A 3-oz serving of meat is about the same size as a deck of cards. One egg equals 1 oz. ? 2 servings of low-fat dairy each day. ? A serving of nuts, seeds, or beans 5 times each  week. ? Heart-healthy fats. Healthy fats called Omega-3 fatty acids are found in foods such as flaxseeds and coldwater fish, like sardines, salmon, and mackerel.  Limit how much you eat of the following: ? Canned or prepackaged foods. ? Food that is high in trans fat, such as fried foods. ? Food that is high in saturated fat, such as fatty meat. ? Sweets, desserts, sugary drinks, and other foods with added sugar. ? Full-fat dairy products.  Do not salt foods before eating.  Try to eat at least 2 vegetarian meals each week.  Eat more home-cooked food and less restaurant, buffet, and fast food.  When eating at a restaurant, ask that your food be prepared with less salt or no salt, if possible. What foods are recommended? The items listed may not be a complete list. Talk with your  dietitian about what dietary choices are best for you. Grains Whole-grain or whole-wheat bread. Whole-grain or whole-wheat pasta. Tracie Lindbloom rice. Modena Morrow. Bulgur. Whole-grain and low-sodium cereals. Pita bread. Low-fat, low-sodium crackers. Whole-wheat flour tortillas. Vegetables Fresh or frozen vegetables (raw, steamed, roasted, or grilled). Low-sodium or reduced-sodium tomato and vegetable juice. Low-sodium or reduced-sodium tomato sauce and tomato paste. Low-sodium or reduced-sodium canned vegetables. Fruits All fresh, dried, or frozen fruit. Canned fruit in natural juice (without added sugar). Meat and other protein foods Skinless chicken or [place]. Ground chicken or [place]. Pork with fat trimmed off. Fish and seafood. Egg whites. Dried beans, peas, or lentils. Unsalted nuts, nut butters, and seeds. Unsalted canned beans. Lean cuts of beef with fat trimmed off. Low-sodium, lean deli meat. Dairy Low-fat (1%) or fat-free (skim) milk. Fat-free, low-fat, or reduced-fat cheeses. Nonfat, low-sodium ricotta or cottage cheese. Low-fat or nonfat yogurt. Low-fat, low-sodium cheese. Fats and oils Soft margarine without trans fats. Vegetable oil. Low-fat, reduced-fat, or light mayonnaise and salad dressings (reduced-sodium). Canola, safflower, olive, soybean, and sunflower oils. Avocado. Seasoning and other foods Herbs. Spices. Seasoning mixes without salt. Unsalted popcorn and pretzels. Fat-free sweets. What foods are not recommended? The items listed may not be a complete list. Talk with your dietitian about what dietary choices are best for you. Grains Baked goods made with fat, such as croissants, muffins, or some breads. Dry pasta or rice meal packs. Vegetables Creamed or fried vegetables. Vegetables in a cheese sauce. Regular canned vegetables (not low-sodium or reduced-sodium). Regular canned tomato sauce and paste (not low-sodium or reduced-sodium). Regular tomato and vegetable juice (not  low-sodium or reduced-sodium). Angie Fava. Olives. Fruits Canned fruit in a light or heavy syrup. Fried fruit. Fruit in cream or butter sauce. Meat and other protein foods Fatty cuts of meat. Ribs. Fried meat. Berniece Salines. Sausage. Bologna and other processed lunch meats. Salami. Fatback. Hotdogs. Bratwurst. Salted nuts and seeds. Canned beans with added salt. Canned or smoked fish. Whole eggs or egg yolks. Chicken or [place] with skin. Dairy Whole or 2% milk, cream, and half-and-half. Whole or full-fat cream cheese. Whole-fat or sweetened yogurt. Full-fat cheese. Nondairy creamers. Whipped toppings. Processed cheese and cheese spreads. Fats and oils Butter. Stick margarine. Lard. Shortening. Ghee. Bacon fat. Tropical oils, such as coconut, palm kernel, or palm oil. Seasoning and other foods Salted popcorn and pretzels. Onion salt, garlic salt, seasoned salt, table salt, and sea salt. Worcestershire sauce. Tartar sauce. Barbecue sauce. Teriyaki sauce. Soy sauce, including reduced-sodium. Steak sauce. Canned and packaged gravies. Fish sauce. Oyster sauce. Cocktail sauce. Horseradish that you find on the shelf. Ketchup. Mustard. Meat flavorings and tenderizers. Bouillon cubes. Hot sauce and  Tabasco sauce. Premade or packaged marinades. Premade or packaged taco seasonings. Relishes. Regular salad dressings. Where to find more information:  National Heart, Lung, and [place]: https://wilson-eaton.com/  American Heart Association: www.heart.org Summary  The DASH eating plan is a healthy eating plan that has been shown to reduce high blood pressure (hypertension). It may also reduce your risk for type 2 diabetes, heart disease, and stroke.  With the DASH eating plan, you should limit salt (sodium) intake to 2,300 mg a day. If you have hypertension, you may need to reduce your sodium intake to 1,500 mg a day.  When on the DASH eating plan, aim to eat more fresh fruits and vegetables, whole grains, lean proteins,  low-fat dairy, and heart-healthy fats.  Work with your health care provider or diet and nutrition specialist (dietitian) to adjust your eating plan to your individual calorie needs. This information is not intended to replace advice given to you by your health care provider. Make sure you discuss any questions you have with your health care provider. Document Revised: 04/03/2017 Document Reviewed: 04/14/2016 Elsevier Patient Education  2020 [employer].

## 2019-12-25 NOTE — Progress Notes (Signed)
I have collaborated with the care management provider regarding care management and care coordination activities outlined in this encounter and have reviewed this encounter including documentation in the note and care plan. I am certifying that I agree with the content of this note and encounter as supervising physician. Avery Klingbeil MD  

## 2020-01-26 ENCOUNTER — Other Ambulatory Visit: Payer: Self-pay | Admitting: Family Medicine

## 2020-02-19 DIAGNOSIS — Z20822 Contact with and (suspected) exposure to covid-19: Secondary | ICD-10-CM | POA: Diagnosis not present

## 2020-03-03 ENCOUNTER — Other Ambulatory Visit: Payer: Self-pay | Admitting: Family Medicine

## 2020-03-05 LAB — HM DIABETES EYE EXAM

## 2020-03-09 ENCOUNTER — Telehealth: Payer: Self-pay

## 2020-03-09 NOTE — Progress Notes (Addendum)
Chronic Care Management Pharmacy Assistant   Name: Carla Mccoy  MRN: [id number] DOB: March 07, 1951  Reason for Encounter: Disease State  Patient Questions:  1.  Have you seen any other providers since your last visit? No  2.  Any changes in your medicines or health? No   Carla Mccoy,  69 y.o. , female presents for their Follow-Up CCM visit with the clinical pharmacist via telephone.  PCP : Abner Greenspan, MD  Allergies:   Allergies  Allergen Reactions   Metoprolol     Lips swelled/ hands swelled and her skin turned red    Sulfonamide Derivatives     Medications: Outpatient Encounter Medications as of 03/09/2020  Medication Sig   atorvastatin (LIPITOR) 10 MG tablet Take 1/2 (one-half) tablet by mouth once daily   Cholecalciferol (VITAMIN D3) 2000 UNITS TABS Take by mouth.     fenofibrate 54 MG tablet Take 1 tablet by mouth once daily   levothyroxine (SYNTHROID) 125 MCG tablet Take 1 tablet by mouth once daily   losartan (COZAAR) 25 MG tablet Take 1 tablet by mouth once daily   Magnesium 400 MG CAPS Take 1 tablet by mouth 3 (three) times a week.   metFORMIN (GLUCOPHAGE) 500 MG tablet TAKE 1 TABLET BY MOUTH TWICE DAILY WITH A MEAL   Multiple Vitamins-Minerals (MULTIVITAMIN,TX-MINERALS) tablet Take 1 tablet by mouth 2 (two) times a week.    Omega-3 Fatty Acids (OMEGA 3 PO) Take 1 capsule by mouth 2 (two) times a week.    vitamin C (ASCORBIC ACID) 500 MG tablet Take 500 mg by mouth daily. (Patient not taking: Reported on 12/23/2019)   Vitamin E 400 units TABS Take 800 Units by mouth daily.    zinc gluconate 50 MG tablet Take 50 mg by mouth 2 (two) times a week.   No facility-administered encounter medications on file as of 03/09/2020.    Current Diagnosis: Patient Active Problem List   Diagnosis Date Noted   Plantar fasciitis of left foot 08/26/2018   Estrogen deficiency 11/03/2017   Osteopenia 11/03/2017   Leg cramps 11/18/2016   Family history of congenital heart  disease 11/18/2016   Arm pain, chronic 11/18/2016   Cough due to ACE inhibitor 11/18/2016   Routine general medical examination at a health care facility 03/26/2015   Colon cancer screening 03/24/2014   Encounter for Medicare annual wellness exam 03/24/2014   Diabetes type 2, controlled ([place]) 03/17/2014   Trigger finger, acquired 08/26/2013   Benign paroxysmal positional vertigo 08/26/2013   OSA (obstructive sleep apnea) 08/10/2013   Chronic mastitis of left breast 07/04/2013   History of breast cancer 02/10/2012   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   BACK PAIN, CHRONIC 05/10/2007   PEPTIC ULCER DISEASE, HX OF 05/10/2007   HYSTERECTOMY, HX OF 05/10/2007   TONSILLECTOMY, HX OF 05/10/2007   Hypothyroidism 01/28/2007   Hyperlipidemia associated with type 2 diabetes mellitus ([place]) 01/28/2007   Overweight 01/28/2007   COMMON MIGRAINE 01/28/2007   Essential hypertension 01/28/2007   FATTY LIVER DISEASE 01/28/2007   CARPAL TUNNEL SYNDROME, HX OF 01/27/2007    Goals Addressed   None    Follow-Up:  Pharmacist Review   Recent Relevant Labs: Lab Results  Component Value Date/Time   HGBA1C 7.1 (H) 07/22/2019 08:56 AM   HGBA1C 7.2 (H) 01/11/2019 08:15 AM   MICROALBUR <0.7 11/02/2015 08:01 AM    Kidney Function Lab Results  Component Value Date/Time   CREATININE 0.96 07/22/2019 08:56 AM   CREATININE 0.91  01/11/2019 08:15 AM   CREATININE 1.0 08/05/2012 08:24 AM   CREATININE 1.0 02/05/2012 10:23 AM   GFR 57.38 (L) 07/22/2019 08:56 AM   GFRNONAA >60 05/31/2009 01:00 PM   GFRAA  05/31/2009 01:00 PM    >60        The eGFR has been calculated using the MDRD equation. This calculation has not been validated in all clinical situations. eGFR's persistently <60 mL/min signify possible Chronic Kidney Disease.   Current antihyperglycemic regimen:  Metformin 500 mg - 1 tablet twice daily What recent interventions/DTPs have been made to improve glycemic control:  Nonc Have there been any  recent hospitalizations or ED visits since last visit with CPP? No Patient denies hypoglycemic symptoms, including None Patient denies hyperglycemic symptoms, including none How often are you checking your blood sugar? once daily What are your blood sugars ranging?  Fasting: 150 During the week, how often does your blood glucose drop below 70? Never Are you checking your feet daily/regularly? Yes  Adherence Review: Is the patient currently on a STATIN medication? Yes Is the patient currently on ACE/ARB medication? Yes Does the patient have >5 day gap between last estimated fill dates? No  Carla Mccoy, [place]  I have reviewed the care management and care coordination activities outlined in this encounter and I am certifying that I agree with the content of this note. More BG readings needed to make assessment. Will schedule CMA follow up call to further assess.   Debbora Dus, PharmD Clinical Pharmacist [facility] [phone]

## 2020-03-19 DIAGNOSIS — E119 Type 2 diabetes mellitus without complications: Secondary | ICD-10-CM | POA: Diagnosis not present

## 2020-03-19 DIAGNOSIS — H25813 Combined forms of age-related cataract, bilateral: Secondary | ICD-10-CM | POA: Diagnosis not present

## 2020-03-19 DIAGNOSIS — H43811 Vitreous degeneration, right eye: Secondary | ICD-10-CM | POA: Diagnosis not present

## 2020-03-19 DIAGNOSIS — H5213 Myopia, bilateral: Secondary | ICD-10-CM | POA: Diagnosis not present

## 2020-03-19 DIAGNOSIS — H179 Unspecified corneal scar and opacity: Secondary | ICD-10-CM | POA: Diagnosis not present

## 2020-03-19 DIAGNOSIS — H52201 Unspecified astigmatism, right eye: Secondary | ICD-10-CM | POA: Diagnosis not present

## 2020-03-19 DIAGNOSIS — H18593 Other hereditary corneal dystrophies, bilateral: Secondary | ICD-10-CM | POA: Diagnosis not present

## 2020-03-19 DIAGNOSIS — H524 Presbyopia: Secondary | ICD-10-CM | POA: Diagnosis not present

## 2020-04-11 ENCOUNTER — Telehealth: Payer: Self-pay | Admitting: Family Medicine

## 2020-04-11 DIAGNOSIS — E785 Hyperlipidemia, unspecified: Secondary | ICD-10-CM

## 2020-04-11 DIAGNOSIS — E119 Type 2 diabetes mellitus without complications: Secondary | ICD-10-CM

## 2020-04-11 DIAGNOSIS — E1169 Type 2 diabetes mellitus with other specified complication: Secondary | ICD-10-CM

## 2020-04-11 DIAGNOSIS — I10 Essential (primary) hypertension: Secondary | ICD-10-CM

## 2020-04-11 DIAGNOSIS — E039 Hypothyroidism, unspecified: Secondary | ICD-10-CM

## 2020-04-11 NOTE — Telephone Encounter (Signed)
-----   Message from Cloyd Stagers, RT sent at 04/10/2020  1:03 PM EST ----- Regarding: Lab Orders for Thursday 12.9.2021 Please place lab orders for Thursday 12.9.2021, office visit for a f/u on Tuesday 12.14.2021 Thank you, Dyke Maes RT(R)

## 2020-04-12 ENCOUNTER — Other Ambulatory Visit (INDEPENDENT_AMBULATORY_CARE_PROVIDER_SITE_OTHER): Payer: PPO

## 2020-04-12 ENCOUNTER — Other Ambulatory Visit: Payer: Self-pay

## 2020-04-12 DIAGNOSIS — E119 Type 2 diabetes mellitus without complications: Secondary | ICD-10-CM

## 2020-04-12 DIAGNOSIS — E039 Hypothyroidism, unspecified: Secondary | ICD-10-CM

## 2020-04-12 DIAGNOSIS — I10 Essential (primary) hypertension: Secondary | ICD-10-CM | POA: Diagnosis not present

## 2020-04-12 DIAGNOSIS — E1169 Type 2 diabetes mellitus with other specified complication: Secondary | ICD-10-CM

## 2020-04-12 DIAGNOSIS — E785 Hyperlipidemia, unspecified: Secondary | ICD-10-CM | POA: Diagnosis not present

## 2020-04-12 LAB — COMPREHENSIVE METABOLIC PANEL
ALT: 18 U/L (ref 0–35)
AST: 23 U/L (ref 0–37)
Albumin: 4.2 g/dL (ref 3.5–5.2)
Alkaline Phosphatase: 36 U/L — ABNORMAL LOW (ref 39–117)
BUN: 15 mg/dL (ref 6–23)
CO2: 28 mEq/L (ref 19–32)
Calcium: 9.6 mg/dL (ref 8.4–10.5)
Chloride: 104 mEq/L (ref 96–112)
Creatinine, Ser: 1.03 mg/dL (ref 0.40–1.20)
GFR: 54.82 mL/min — ABNORMAL LOW (ref >60.00)
Glucose, Bld: 134 mg/dL — ABNORMAL HIGH (ref 70–99)
Potassium: 4.5 mEq/L (ref 3.5–5.1)
Sodium: 139 mEq/L (ref 135–145)
Total Bilirubin: 0.5 mg/dL (ref 0.2–1.2)
Total Protein: 7 g/dL (ref 6.0–8.3)

## 2020-04-12 LAB — CBC WITH DIFFERENTIAL/PLATELET
Basophils Absolute: 0.1 10*3/uL (ref 0.0–0.1)
Basophils Relative: 1.2 % (ref 0.0–3.0)
Eosinophils Absolute: 0.3 10*3/uL (ref 0.0–0.7)
Eosinophils Relative: 4.1 % (ref 0.0–5.0)
HCT: 40.4 % (ref 36.0–46.0)
Hemoglobin: 13.6 g/dL (ref 12.0–15.0)
Lymphocytes Relative: 26.7 % (ref 12.0–46.0)
Lymphs Abs: 2.1 10*3/uL (ref 0.7–4.0)
MCHC: 33.6 g/dL (ref 30.0–36.0)
MCV: 89.7 fl (ref 78.0–100.0)
Monocytes Absolute: 0.5 10*3/uL (ref 0.1–1.0)
Monocytes Relative: 6.3 % (ref 3.0–12.0)
Neutro Abs: 4.9 10*3/uL (ref 1.4–7.7)
Neutrophils Relative %: 61.7 % (ref 43.0–77.0)
Platelets: 356 10*3/uL (ref 150.0–400.0)
RBC: 4.5 Mil/uL (ref 3.87–5.11)
RDW: 12.9 % (ref 11.5–15.5)
WBC: 8 10*3/uL (ref 4.0–10.5)

## 2020-04-12 LAB — LIPID PANEL
Cholesterol: 118 mg/dL (ref 0–200)
HDL: 37.9 mg/dL — ABNORMAL LOW (ref >39.00)
LDL Cholesterol: 46 mg/dL (ref 0–99)
NonHDL: 80.5
Total CHOL/HDL Ratio: 3
Triglycerides: 173 mg/dL — ABNORMAL HIGH (ref 0.0–149.0)
VLDL: 34.6 mg/dL (ref 0.0–40.0)

## 2020-04-12 LAB — TSH: TSH: 2.92 u[IU]/mL (ref 0.35–4.50)

## 2020-04-12 LAB — HEMOGLOBIN A1C: Hgb A1c MFr Bld: 7.3 % — ABNORMAL HIGH (ref 4.6–6.5)

## 2020-04-17 ENCOUNTER — Encounter: Payer: Self-pay | Admitting: Family Medicine

## 2020-04-17 ENCOUNTER — Ambulatory Visit (INDEPENDENT_AMBULATORY_CARE_PROVIDER_SITE_OTHER): Payer: PPO | Admitting: Family Medicine

## 2020-04-17 ENCOUNTER — Other Ambulatory Visit: Payer: Self-pay

## 2020-04-17 VITALS — BP 128/74 | HR 72 | Temp 97.0°F | Ht 65.75 in | Wt 175.6 lb

## 2020-04-17 DIAGNOSIS — E119 Type 2 diabetes mellitus without complications: Secondary | ICD-10-CM | POA: Diagnosis not present

## 2020-04-17 DIAGNOSIS — E039 Hypothyroidism, unspecified: Secondary | ICD-10-CM | POA: Diagnosis not present

## 2020-04-17 DIAGNOSIS — R1010 Upper abdominal pain, unspecified: Secondary | ICD-10-CM | POA: Diagnosis not present

## 2020-04-17 DIAGNOSIS — E785 Hyperlipidemia, unspecified: Secondary | ICD-10-CM

## 2020-04-17 DIAGNOSIS — E1169 Type 2 diabetes mellitus with other specified complication: Secondary | ICD-10-CM

## 2020-04-17 DIAGNOSIS — I10 Essential (primary) hypertension: Secondary | ICD-10-CM | POA: Diagnosis not present

## 2020-04-17 MED ORDER — METFORMIN HCL 1000 MG PO TABS: 1000.0000 mg | ORAL_TABLET | Freq: Two times a day (BID) | ORAL | 3 refills | Status: DC

## 2020-04-17 NOTE — Assessment & Plan Note (Signed)
Lab Results  Component Value Date   HGBA1C 7.3 (H) 04/12/2020   This is up slt  Diet is good but not exercising  May consider glucose equip and testing later Plan to inc metformin to 1000 mg bid (pt apprehensive and may slowly titrate up)  Enc her to add 30 min of exercise daily and keep up good wt loss effort  Sent for eye exam 11/21 On statin and arb F/u 3 mo

## 2020-04-17 NOTE — Assessment & Plan Note (Signed)
Pt has upper abdominal pain - bilateral but worse on L  Worse with bending over  Tender (significant) on exam  No GI symptoms at all  Abdominal US ordered  Reassuring labs (consider lipase later if continues)  Plan to follow result of [name]

## 2020-04-17 NOTE — Progress Notes (Signed)
Subjective:    Patient ID: Carla Mccoy, female    DOB: 06/30/1950, 69 y.o.   MRN: [id number]  HPI Pt presents for f/u of chronic health problems   Wt Readings from Last 3 Encounters:  04/17/20 175 lb 9 oz (79.6 kg)  07/22/19 181 lb 8 oz (82.3 kg)  01/17/19 180 lb 9 oz (81.9 kg)   28.55 kg/m  Goal to keep loosing wt  Ultimate goal in 150s  Exercise- has not been able to because she was keeping grandchildren  Has a large property to mow (sitting mow)   Dx with tennis elbow  of L arm   covid vaccinated -planning booster   Eye exam 11/20  (11/21 but we do not have the report)   HTN bp is stable today  No cp or palpitations or headaches or edema  No side effects to medicines  BP Readings from Last 3 Encounters:  04/17/20 128/74  07/22/19 132/70  01/17/19 129/78    Takes losartan 25 mg once daily  Pulse Readings from Last 3 Encounters:  04/17/20 72  07/22/19 70  01/17/19 76   Hypothyroidism  Pt has no clinical changes No change in energy level/ hair or skin/ edema and no tremor Lab Results  Component Value Date   TSH 2.92 04/12/2020     DM2 Lab Results  Component Value Date   HGBA1C 7.3 (H) 04/12/2020  pt was surprised because she lost weight  Has made a big effort  Not overeating  Eating right  Taking metformin 500 mg bid   On statin and ace   Hyperlipidemia  Lab Results  Component Value Date   CHOL 118 04/12/2020   CHOL 126 07/22/2019   CHOL 108 01/11/2019   Lab Results  Component Value Date   HDL 37.90 (L) 04/12/2020   HDL 40.60 07/22/2019   HDL 34.40 (L) 01/11/2019   Lab Results  Component Value Date   LDLCALC 46 04/12/2020   LDLCALC 54 07/22/2019   LDLCALC 39 01/11/2019   Lab Results  Component Value Date   TRIG 173.0 (H) 04/12/2020   TRIG 157.0 (H) 07/22/2019   TRIG 176.0 (H) 01/11/2019   Lab Results  Component Value Date   CHOLHDL 3 04/12/2020   CHOLHDL 3 07/22/2019   CHOLHDL 3 01/11/2019   Lab Results  Component Value  Date   LDLDIRECT 70.0 05/07/2016   LDLDIRECT 85.0 11/02/2015   LDLDIRECT 93.0 03/19/2015   Taking atorvastatin 10 mg 1/2 tab daily   Other labs Lab Results  Component Value Date   CREATININE 1.03 04/12/2020   BUN 15 04/12/2020   NA 139 04/12/2020   K 4.5 04/12/2020   CL 104 04/12/2020   CO2 28 04/12/2020   Lab Results  Component Value Date   ALT 18 04/12/2020   AST 23 04/12/2020   ALKPHOS 36 (L) 04/12/2020   BILITOT 0.5 04/12/2020   Lab Results  Component Value Date   WBC 8.0 04/12/2020   HGB 13.6 04/12/2020   HCT 40.4 04/12/2020   MCV 89.7 04/12/2020   PLT 356.0 04/12/2020   Pain in both sides of upper abdomen  Worse with bending Quite tender    Patient Active Problem List   Diagnosis Date Noted  . Upper abdominal pain 04/17/2020  . Plantar fasciitis of left foot 08/26/2018  . Estrogen deficiency 11/03/2017  . Osteopenia 11/03/2017  . Leg cramps 11/18/2016  . Family history of congenital heart disease 11/18/2016  . Arm pain, chronic  11/18/2016  . Cough due to ACE inhibitor 11/18/2016  . Routine general medical examination at a health care facility 03/26/2015  . Colon cancer screening 03/24/2014  . Encounter for Medicare annual wellness exam 03/24/2014  . Diabetes type 2, controlled ([place]) 03/17/2014  . Benign paroxysmal positional vertigo 08/26/2013  . OSA (obstructive sleep apnea) 08/10/2013  . Chronic mastitis of left breast 07/04/2013  . History of breast cancer 02/10/2012  . 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 BACK PAIN, CHRONIC 05/10/2007  . PEPTIC ULCER DISEASE, HX OF 05/10/2007  . HYSTERECTOMY, HX OF 05/10/2007  . TONSILLECTOMY, HX OF 05/10/2007  . Hypothyroidism 01/28/2007  . Hyperlipidemia associated with type 2 diabetes mellitus ([place]) 01/28/2007  . Overweight 01/28/2007  . COMMON MIGRAINE 01/28/2007  . Essential hypertension 01/28/2007  . FATTY LIVER DISEASE 01/28/2007  . CARPAL TUNNEL SYNDROME, HX OF 01/27/2007   Past Medical History:   Diagnosis Date  . Allergy   . 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 Arthritis   . Backache, unspecified   . breast ca dx'd 06/2009   breast cancer left - lumpectomy  . Diabetes mellitus without complication ([place])   . Full dentures   . Migraine without aura, without mention of intractable migraine without mention of status migrainosus   . Obesity, unspecified   . Other abnormal glucose   . Other and unspecified hyperlipidemia   . Other chronic nonalcoholic liver disease   . Personal history of peptic ulcer disease   . Personal history of radiation therapy   . 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 Sleep apnea   . Tachy-brady syndrome ([place])   . Unspecified essential hypertension 09/12/13   remote history; no current treatment  . Unspecified hypothyroidism   . Wears glasses    Past Surgical History:  Procedure Laterality Date  . ABDOMINAL HYSTERECTOMY  1988  . BREAST BIOPSY Left    in the 80's - benign tissue  . BREAST LUMPECTOMY Left 2011   Feb '11  . Breast Surgery x 2 Left   . INCISION AND DRAINAGE OF WOUND Left 09/14/2013   Procedure: IRRIGATION AND DEBRIDEMENT LEFT BREAST ABSCESS;  Surgeon: Marcello Moores A. Cornett, MD;  Location: [facility];  Service: General;  Laterality: Left;  . INCISION AND DRAINAGE OF WOUND Left 12/21/2013   Procedure: DEBRIDEMENT OF LEFT BREAST WOUND WITH PLACEMENT OF A CELL;  Surgeon: Theodoro Kos, DO;  Location: [facility];  Service: Plastics;  Laterality: Left;  . LAPAROSCOPY ABDOMEN DIAGNOSTIC    . TONSILLECTOMY    . TUBAL LIGATION  1971   Social History   Tobacco Use  . Smoking status: Never Smoker  . Smokeless tobacco: Never Used  Vaping Use  . Vaping Use: Never used  Substance Use Topics  . Alcohol use: No    Alcohol/week: 0.0 standard drinks  . Drug use: No   Family History  Problem Relation Age of Onset  . Peripheral vascular disease Mother   .  Coronary artery disease Mother   . Hyperlipidemia Mother   . Heart disease Mother        CAD/fatal MI  . Diabetes Mother   . Stroke Mother   . Diabetes Sister   . Hypothyroidism Sister   . Diabetes Brother   . Hyperlipidemia Brother   . Heart disease Brother   . Hypertrophic cardiomyopathy Brother        congenital  . Colon  cancer Neg Hx   . Esophageal cancer Neg Hx   . Pancreatic cancer Neg Hx   . Stomach cancer Neg Hx   . Rectal cancer Neg Hx   . Breast cancer Neg Hx    Allergies  Allergen Reactions  . Metoprolol     Lips swelled/ hands swelled and her skin turned red   . Sulfonamide Derivatives    Current Outpatient Medications on File Prior to Visit  Medication Sig Dispense Refill  . atorvastatin (LIPITOR) 10 MG tablet Take 1/2 (one-half) tablet by mouth once daily 45 tablet 0  . Cholecalciferol (VITAMIN D3) 2000 UNITS TABS Take by mouth.    . fenofibrate 54 MG tablet Take 1 tablet by mouth once daily 90 tablet 1  . levothyroxine (SYNTHROID) 125 MCG tablet Take 1 tablet by mouth once daily 90 tablet 1  . losartan (COZAAR) 25 MG tablet Take 1 tablet by mouth once daily 90 tablet 1  . Magnesium 400 MG CAPS Take 1 tablet by mouth 3 (three) times a week.    . Multiple Vitamins-Minerals (MULTIVITAMIN,TX-MINERALS) tablet Take 1 tablet by mouth 2 (two) times a week.    . Omega-3 Fatty Acids (OMEGA 3 PO) Take 1 capsule by mouth 2 (two) times a week.     . vitamin C (ASCORBIC ACID) 500 MG tablet Take 500 mg by mouth daily.    . Vitamin E 400 units TABS Take 800 Units by mouth daily.    [name] zinc gluconate 50 MG tablet Take 50 mg by mouth 2 (two) times a week.     No current facility-administered medications on file prior to visit.    Review of Systems  Constitutional: Negative for activity change, appetite change, fatigue, fever and unexpected weight change.  HENT: Negative for congestion, ear pain, rhinorrhea, sinus pressure and sore throat.   Eyes: Negative for pain, redness  and visual disturbance.  Respiratory: Negative for cough, shortness of breath and wheezing.   Cardiovascular: Negative for chest pain and palpitations.  Gastrointestinal: Positive for abdominal pain. Negative for abdominal distention, anal bleeding, blood in stool, constipation, diarrhea, nausea, rectal pain and vomiting.  Endocrine: Negative for polydipsia and polyuria.  Genitourinary: Negative for dysuria, frequency and urgency.  Musculoskeletal: Negative for arthralgias, back pain and myalgias.       L elbow/arm pain     Skin: Negative for pallor and rash.  Allergic/Immunologic: Negative for environmental allergies.  Neurological: Negative for dizziness, syncope and headaches.  Hematological: Negative for adenopathy. Does not bruise/bleed easily.  Psychiatric/Behavioral: Negative for decreased concentration and dysphoric mood. The patient is not nervous/anxious.        Objective:   Physical Exam Constitutional:      General: She is not in acute distress.    Appearance: Normal appearance. She is well-developed and well-nourished. She is not ill-appearing or diaphoretic.     Comments: overweight  HENT:     Head: Normocephalic and atraumatic.     Mouth/Throat:     Mouth: Oropharynx is clear and moist.  Eyes:     General: No scleral icterus.    Extraocular Movements: EOM normal.     Conjunctiva/sclera: Conjunctivae normal.     Pupils: Pupils are equal, round, and reactive to light.  Neck:     Thyroid: No thyromegaly.     Vascular: No carotid bruit or JVD.  Cardiovascular:     Rate and Rhythm: Normal rate and regular rhythm.     Pulses: Intact distal pulses.  Heart sounds: Normal heart sounds. No gallop.   Pulmonary:     Effort: Pulmonary effort is normal. No respiratory distress.     Breath sounds: Normal breath sounds. No stridor. No wheezing or rales.     Comments: No crackles Abdominal:     General: Abdomen is flat. Bowel sounds are normal. There is no distension or  abdominal bruit.     Palpations: Abdomen is soft. There is no hepatomegaly, splenomegaly, mass or pulsatile mass.     Tenderness: There is abdominal tenderness in the right upper quadrant, epigastric area and left upper quadrant. There is no right CVA tenderness, left CVA tenderness, guarding or rebound. Positive signs include Murphy's sign.     Hernia: No hernia is present.  Musculoskeletal:        General: No edema.     Cervical back: Normal range of motion and neck supple.  Lymphadenopathy:     Cervical: No cervical adenopathy.  Skin:    General: Skin is warm and dry.     Findings: No rash.  Neurological:     Mental Status: She is alert.     Sensory: No sensory deficit.     Coordination: Coordination normal.     Deep Tendon Reflexes: Reflexes are normal and symmetric.  Psychiatric:        Mood and Affect: Mood and affect normal.           Assessment & Plan:   Problem List Items Addressed This Visit      Cardiovascular and Mediastinum   Essential hypertension    bp in fair control at this time  BP Readings from Last 1 Encounters:  04/17/20 128/74   No changes needed Most recent labs reviewed  Disc lifstyle change with low sodium diet and exercise  Plan to continue losartan 25 mg daily          Endocrine   Hypothyroidism    Hypothyroidism  Pt has no clinical changes No change in energy level/ hair or skin/ edema and no tremor Lab Results  Component Value Date   TSH 2.92 04/12/2020    Plan to continue levothyroxine 125 mcg daily      Hyperlipidemia associated with type 2 diabetes mellitus ([place])    Disc goals for lipids and reasons to control them Rev last labs with pt Rev low sat fat diet in detail Very well controlled LDL at 46  Trig up slt due to glucose and HDL down likely due to less exercise  Plan to continue atorvastatin 5 mg daily      Relevant Medications   metFORMIN (GLUCOPHAGE) 1000 MG tablet   Diabetes type 2, controlled ([place]) - Primary     Lab Results  Component Value Date   HGBA1C 7.3 (H) 04/12/2020   This is up slt  Diet is good but not exercising  May consider glucose equip and testing later Plan to inc metformin to 1000 mg bid (pt apprehensive and may slowly titrate up)  Enc her to add 30 min of exercise daily and keep up good wt loss effort  Sent for eye exam 11/21 On statin and arb F/u 3 mo      Relevant Medications   metFORMIN (GLUCOPHAGE) 1000 MG tablet     Other   Upper abdominal pain    Pt has upper abdominal pain - bilateral but worse on L  Worse with bending over  Tender (significant) on exam  No GI symptoms at all  Abdominal US ordered  Reassuring labs (consider lipase later if continues)  Plan to follow result of [name]      Relevant Orders   US Abdomen Complete

## 2020-04-17 NOTE — Assessment & Plan Note (Signed)
bp in fair control at this time  BP Readings from Last 1 Encounters:  04/17/20 128/74   No changes needed Most recent labs reviewed  Disc lifstyle change with low sodium diet and exercise  Plan to continue losartan 25 mg daily

## 2020-04-17 NOTE — Assessment & Plan Note (Signed)
Hypothyroidism  Pt has no clinical changes No change in energy level/ hair or skin/ edema and no tremor Lab Results  Component Value Date   TSH 2.92 04/12/2020    Plan to continue levothyroxine 125 mcg daily

## 2020-04-17 NOTE — Patient Instructions (Addendum)
Add 30 minutes of exercise daily if you can  Videos/walking-do what you like   Keep eating a diabetes diet  Try to get most of your carbohydrates from produce (with the exception of white potatoes)  Eat less bread/pasta/rice/snack foods/cereals/sweets and other items from the middle of the grocery store (processed carbs)  I would like to increase your metformin to 1000 mg twice daily  You can work up to it gradually  If any problems let me know    I ordered an ultrasound of abdomen  The office will call you to set that up   Follow up in 3 months

## 2020-04-17 NOTE — Assessment & Plan Note (Signed)
Disc goals for lipids and reasons to control them Rev last labs with pt Rev low sat fat diet in detail Very well controlled LDL at 46  Trig up slt due to glucose and HDL down likely due to less exercise  Plan to continue atorvastatin 5 mg daily

## 2020-04-19 ENCOUNTER — Other Ambulatory Visit: Payer: Self-pay | Admitting: Family Medicine

## 2020-05-08 ENCOUNTER — Telehealth: Payer: Self-pay

## 2020-05-08 NOTE — Chronic Care Management (AMB) (Addendum)
Chronic Care Management Pharmacy Assistant   Name: Carla Mccoy  MRN: [id number] DOB: 1950/08/12  Reason for Encounter: Disease State  Patient Questions:  1.  Have you seen any other providers since your last visit? Yes 04/17/20 Loura Pardon, MD- PCP  2.  Any changes in your medicines or health? Yes 04/17/20 Dr. Glori Bickers increased Metformin from 500 mg BID to Metformin 100 mg TID     PCP : Abner Greenspan, MD  Allergies:   Allergies  Allergen Reactions   Metoprolol     Lips swelled/ hands swelled and her skin turned red    Sulfonamide Derivatives     Medications: Outpatient Encounter Medications as of 05/08/2020  Medication Sig   atorvastatin (LIPITOR) 10 MG tablet Take 1/2 (one-half) tablet by mouth once daily   Cholecalciferol (VITAMIN D3) 2000 UNITS TABS Take by mouth.   fenofibrate 54 MG tablet Take 1 tablet by mouth once daily   levothyroxine (SYNTHROID) 125 MCG tablet Take 1 tablet by mouth once daily   losartan (COZAAR) 25 MG tablet Take 1 tablet by mouth once daily   Magnesium 400 MG CAPS Take 1 tablet by mouth 3 (three) times a week.   metFORMIN (GLUCOPHAGE) 1000 MG tablet Take 1 tablet (1,000 mg total) by mouth 2 (two) times daily with a meal.   Multiple Vitamins-Minerals (MULTIVITAMIN,TX-MINERALS) tablet Take 1 tablet by mouth 2 (two) times a week.   Omega-3 Fatty Acids (OMEGA 3 PO) Take 1 capsule by mouth 2 (two) times a week.    vitamin C (ASCORBIC ACID) 500 MG tablet Take 500 mg by mouth daily.   Vitamin E 400 units TABS Take 800 Units by mouth daily.   zinc gluconate 50 MG tablet Take 50 mg by mouth 2 (two) times a week.   No facility-administered encounter medications on file as of 05/08/2020.    Current Diagnosis: Patient Active Problem List   Diagnosis Date Noted   Upper abdominal pain 04/17/2020   Plantar fasciitis of left foot 08/26/2018   Estrogen deficiency 11/03/2017   Osteopenia 11/03/2017   Leg cramps 11/18/2016   Family history of congenital  heart disease 11/18/2016   Cough due to ACE inhibitor 11/18/2016   Routine general medical examination at a health care facility 03/26/2015   Colon cancer screening 03/24/2014   Encounter for Medicare annual wellness exam 03/24/2014   Diabetes type 2, controlled ([place]) 03/17/2014   Benign paroxysmal positional vertigo 08/26/2013   OSA (obstructive sleep apnea) 08/10/2013   Chronic mastitis of left breast 07/04/2013   History of breast cancer 02/10/2012   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   BACK PAIN, CHRONIC 05/10/2007   PEPTIC ULCER DISEASE, HX OF 05/10/2007   HYSTERECTOMY, HX OF 05/10/2007   TONSILLECTOMY, HX OF 05/10/2007   Hypothyroidism 01/28/2007   Hyperlipidemia associated with type 2 diabetes mellitus ([place]) 01/28/2007   Overweight 01/28/2007   COMMON MIGRAINE 01/28/2007   Essential hypertension 01/28/2007   FATTY LIVER DISEASE 01/28/2007   CARPAL TUNNEL SYNDROME, HX OF 01/27/2007   Recent Relevant Labs: Lab Results  Component Value Date/Time   HGBA1C 7.3 (H) 04/12/2020 08:15 AM   HGBA1C 7.1 (H) 07/22/2019 08:56 AM   MICROALBUR <0.7 11/02/2015 08:01 AM    Kidney Function Lab Results  Component Value Date/Time   CREATININE 1.03 04/12/2020 08:15 AM   CREATININE 0.96 07/22/2019 08:56 AM   CREATININE 1.0 08/05/2012 08:24 AM   CREATININE 1.0 02/05/2012 10:23 AM   GFR 54.82 (L) 04/12/2020 08:15 AM  GFRNONAA >60 05/31/2009 01:00 PM   GFRAA  05/31/2009 01:00 PM    >60        The eGFR has been calculated using the MDRD equation. This calculation has not been validated in all clinical situations. eGFR's persistently <60 mL/min signify possible Chronic Kidney Disease.    Current antihyperglycemic regimen:  Metformin 1000 mg twice a day.  What recent interventions/DTPs have been made to improve glycemic control:  Dr. Glori Bickers increased metformin from 500 mg to 1000 mg.   Have there been any recent hospitalizations or ED visits since last visit with CPP? No   Patient  denies hypoglycemic symptoms, including Pale, Sweaty, Shaky, Hungry, Nervous/irritable and Vision changes   Patient denies hyperglycemic symptoms, including blurry vision, excessive thirst, fatigue, polyuria and weakness   How often are you checking your blood sugar? Not checking. States she has not been told to check her blood sugars.    What are your blood sugars ranging?  Fasting: N/A Before meals: N/A After meals: N/A Bedtime: N/A  During the week, how often does your blood glucose drop below 70? Does not check.    Are you checking your feet daily/regularly? Yes. Denies any wounds or sores.  Adherence Review: Is the patient currently on a STATIN medication? Yes Is the patient currently on ACE/ARB medication? Yes Does the patient have >5 day gap between last estimated fill dates? No, refills timely  States that she has not been told to check her blood sugars. States she and Dr. Glori Bickers discussed starting to check home BG if A1C does not come down in March.   Follow-Up:  Pharmacist Review   Debbora Dus, CPP notified  Margaretmary Dys, [place] Assistant [phone]  I have reviewed the care management and care coordination activities outlined in this encounter and I am certifying that I agree with the content of this note. No further action required.  Debbora Dus, PharmD Clinical Pharmacist [facility] [phone]

## 2020-05-09 ENCOUNTER — Ambulatory Visit
Admission: RE | Admit: 2020-05-09 | Discharge: 2020-05-09 | Disposition: A | Discharge status: Home / Self Care | Payer: PPO | Source: Ambulatory Visit | Attending: Family Medicine | Admitting: Family Medicine

## 2020-05-09 DIAGNOSIS — K7689 Other specified diseases of liver: Secondary | ICD-10-CM | POA: Diagnosis not present

## 2020-05-09 DIAGNOSIS — R1010 Upper abdominal pain, unspecified: Secondary | ICD-10-CM

## 2020-06-11 ENCOUNTER — Other Ambulatory Visit: Payer: Self-pay

## 2020-06-11 ENCOUNTER — Ambulatory Visit (INDEPENDENT_AMBULATORY_CARE_PROVIDER_SITE_OTHER): Payer: PPO

## 2020-06-11 DIAGNOSIS — I10 Essential (primary) hypertension: Secondary | ICD-10-CM | POA: Diagnosis not present

## 2020-06-11 DIAGNOSIS — E119 Type 2 diabetes mellitus without complications: Secondary | ICD-10-CM

## 2020-06-11 NOTE — Progress Notes (Addendum)
Chronic Care Management Pharmacy Note  06/11/2020 Name:  Carla Mccoy MRN:  [id number] DOB:  10/21/1950  Subjective: Carla Mccoy is an 70 y.o. year old female who is a primary patient of [name], [name], MD.  The CCM team was consulted for assistance with disease management and care coordination needs.    Engaged with patient by telephone for follow up visit in response to provider referral for pharmacy case management and/or care coordination services.   Consent to Services:  The patient was given information about Chronic Care Management services, agreed to services, and gave verbal consent prior to initiation of services.  Please see initial visit note for detailed documentation.   Patient Care Team: [name], [name], MD as PCP - General (Family Medicine) Romine, Lubertha South, MD (Obstetrics and Gynecology) Verl Blalock, Marijo Conception, MD (Inactive) (Cardiology) Love, Alyson Locket, MD (Neurology) Sable Feil, MD (Gastroenterology) Magrinat, Virgie Dad, MD (Hematology and Oncology) Erroll Luna, MD (General Surgery) Sharol Roussel, Rafael Capo (Optometry) Clent Jacks, MD (Ophthalmology) Debbora Dus, [facility] as Pharmacist (Pharmacist)  Recent office visits: 04/17/20: PCP - Increase metformin to 1000 mg BID  Recent consult visits: None  Objective:  Lab Results  Component Value Date   CREATININE 1.03 04/12/2020   BUN 15 04/12/2020   GFR 54.82 (L) 04/12/2020   GFRNONAA >60 05/31/2009   GFRAA  05/31/2009    >60        The eGFR has been calculated using the MDRD equation. This calculation has not been validated in all clinical situations. eGFR's persistently <60 mL/min signify possible Chronic Kidney Disease.   NA 139 04/12/2020   K 4.5 04/12/2020   CALCIUM 9.6 04/12/2020   CO2 28 04/12/2020    Lab Results  Component Value Date/Time   HGBA1C 7.3 (H) 04/12/2020 08:15 AM   HGBA1C 7.1 (H) 07/22/2019 08:56 AM   GFR 54.82 (L) 04/12/2020 08:15 AM   GFR 57.38 (L) 07/22/2019 08:56 AM    MICROALBUR <0.7 11/02/2015 08:01 AM    Last diabetic Eye exam:  Lab Results  Component Value Date/Time   HMDIABEYEEXA No Retinopathy 03/05/2020 12:00 AM    Last diabetic Foot exam: No results found for: HMDIABFOOTEX   Lab Results  Component Value Date   CHOL 118 04/12/2020   HDL 37.90 (L) 04/12/2020   LDLCALC 46 04/12/2020   LDLDIRECT 70.0 05/07/2016   TRIG 173.0 (H) 04/12/2020   CHOLHDL 3 04/12/2020    Hepatic Function Latest Ref Rng & Units 04/12/2020 07/22/2019 01/11/2019  Total Protein 6.0 - 8.3 g/dL 7.0 7.1 6.9  Albumin 3.5 - 5.2 g/dL 4.2 4.1 4.0  AST 0 - 37 U/L 23 17 18   ALT 0 - 35 U/L 18 14 15   Alk Phosphatase 39 - 117 U/L 36(L) 42 36(L)  Total Bilirubin 0.2 - 1.2 mg/dL 0.5 0.5 0.5  Bilirubin, Direct 0.0 - 0.3 mg/dL - - -    Lab Results  Component Value Date/Time   TSH 2.92 04/12/2020 08:15 AM   TSH 2.13 01/11/2019 08:15 AM   FREET4 1.18 03/22/2013 11:58 AM   FREET4 0.77 08/08/2010 02:23 PM    CBC Latest Ref Rng & Units 04/12/2020 01/11/2019 10/19/2017  WBC 4.0 - 10.5 K/uL 8.0 7.2 7.3  Hemoglobin 12.0 - 15.0 g/dL 13.6 13.6 13.6  Hematocrit 36.0 - 46.0 % 40.4 40.3 40.1  Platelets 150.0 - 400.0 K/uL 356.0 315.0 365.0    Lab Results  Component Value Date/Time   VD25OH 41 01/23/2011 04:14 PM  VD25OH 43 01/22/2010 01:05 PM    Clinical ASCVD: No  The ASCVD Risk score Mikey Bussing DC Jr., et al., 2013) failed to calculate for the following reasons:   The valid total cholesterol range is 130 to 320 mg/dL    Depression screen [facility] 2/9 04/17/2020 01/17/2019 10/19/2017  Decreased Interest 0 0 0  Down, Depressed, Hopeless 0 0 0  PHQ - 2 Score 0 0 0  Altered sleeping 0 - 0  Tired, decreased energy 0 - 0  Change in appetite 0 - 0  Feeling bad or failure about yourself  0 - 0  Trouble concentrating 0 - 0  Moving slowly or fidgety/restless 0 - 0  Suicidal thoughts 0 - 0  PHQ-9 Score 0 - 0  Difficult doing work/chores Not difficult at all - Not difficult at all    Social  History   Tobacco Use  Smoking Status Never Smoker  Smokeless Tobacco Never Used   BP Readings from Last 3 Encounters:  04/17/20 128/74  07/22/19 132/70  01/17/19 129/78   Pulse Readings from Last 3 Encounters:  04/17/20 72  07/22/19 70  01/17/19 76   Wt Readings from Last 3 Encounters:  04/17/20 175 lb 9 oz (79.6 kg)  07/22/19 181 lb 8 oz (82.3 kg)  01/17/19 180 lb 9 oz (81.9 kg)    Assessment/Interventions: Review of patient past medical history, allergies, medications, health status, including review of consultants reports, laboratory and other test data, was performed as part of comprehensive evaluation and provision of chronic care management services.   SDOH:  (Social Determinants of Health) assessments and interventions performed: SDOH Interventions   Flowsheet Row Most Recent Value  SDOH Interventions   Financial Strain Interventions Intervention Not Indicated  [meds affordable]      CCM Care Plan  Allergies  Allergen Reactions  . Metoprolol     Lips swelled/ hands swelled and her skin turned red   . Sulfonamide Derivatives     Medications Reviewed Today    Reviewed by Tammi Sou, CMA (Certified Medical Assistant) on 04/17/20 at [place] List Status: <None>  Medication Order Taking? Sig Documenting Provider Last Dose Status Informant  atorvastatin (LIPITOR) 10 MG tablet [id number] Yes Take 1/2 (one-half) tablet by mouth once daily [name], [name], MD Taking Active   Cholecalciferol (VITAMIN D3) 2000 UNITS TABS [id number] Yes Take by mouth. [provider] Taking Active   fenofibrate 54 MG tablet [id number] Yes Take 1 tablet by mouth once daily [name], [name], MD Taking Active   levothyroxine (SYNTHROID) 125 MCG tablet [id number] Yes Take 1 tablet by mouth once daily [name], [name], MD Taking Active   losartan (COZAAR) 25 MG tablet [id number] Yes Take 1 tablet by mouth once daily [name], [name], MD Taking Active   Magnesium 400 MG CAPS [id number]  Yes Take 1 tablet by mouth 3 (three) times a week. [provider] Taking Active   metFORMIN (GLUCOPHAGE) 500 MG tablet [id number] Yes TAKE 1 TABLET BY MOUTH TWICE DAILY WITH A MEAL [name], [name], MD Taking Active   Multiple Vitamins-Minerals (MULTIVITAMIN,TX-MINERALS) tablet [id number] Yes Take 1 tablet by mouth 2 (two) times a week. [provider] Taking Active   Omega-3 Fatty Acids (OMEGA 3 PO) [id number] Yes Take 1 capsule by mouth 2 (two) times a week.  [provider] Taking Active   vitamin C (ASCORBIC ACID) 500 MG tablet [id number] Yes Take 500 mg by mouth daily. [provider] Taking Active  Vitamin E 400 units TABS [id number] Yes Take 800 Units by mouth daily. [provider] Taking Active   zinc gluconate 50 MG tablet [id number] Yes Take 50 mg by mouth 2 (two) times a week. [provider] Taking Active           Patient Active Problem List   Diagnosis Date Noted  . Upper abdominal pain 04/17/2020  . Plantar fasciitis of left foot 08/26/2018  . Estrogen deficiency 11/03/2017  . Osteopenia 11/03/2017  . Leg cramps 11/18/2016  . Family history of congenital heart disease 11/18/2016  . Cough due to ACE inhibitor 11/18/2016  . Routine general medical examination at a health care facility 03/26/2015  . Colon cancer screening 03/24/2014  . Encounter for Medicare annual wellness exam 03/24/2014  . Diabetes type 2, controlled ([place]) 03/17/2014  . Benign paroxysmal positional vertigo 08/26/2013  . OSA (obstructive sleep apnea) 08/10/2013  . Chronic mastitis of left breast 07/04/2013  . History of breast cancer 02/10/2012  . 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 BACK PAIN, CHRONIC 05/10/2007  . PEPTIC ULCER DISEASE, HX OF 05/10/2007  . HYSTERECTOMY, HX OF 05/10/2007  . TONSILLECTOMY, HX OF 05/10/2007  . Hypothyroidism 01/28/2007  . Hyperlipidemia associated with type 2 diabetes mellitus ([place]) 01/28/2007  . Overweight 01/28/2007  .  COMMON MIGRAINE 01/28/2007  . Essential hypertension 01/28/2007  . FATTY LIVER DISEASE 01/28/2007  . CARPAL TUNNEL SYNDROME, HX OF 01/27/2007    Immunization History  Administered Date(s) Administered  . Fluad Quad(high Dose 65+) 01/17/2019  . H1N1 03/06/2008  . Influenza Whole 03/06/2008  . Influenza, High Dose Seasonal PF 03/28/2016  . Influenza,inj,Quad PF,6+ Mos 03/24/2014, 03/26/2015, 03/10/2017  . Influenza-Unspecified 02/02/2013, 02/18/2018, 03/08/2020  . PFIZER(Purple Top)SARS-COV-2 Vaccination 06/25/2019, 07/19/2019  . Pneumococcal Conjugate-13 03/22/2013  . Pneumococcal Polysaccharide-23 09/02/2011, 03/10/2017  . Td 08/07/2008   Patient Care Plan: CCM Pharmacy Care Plan    Problem Identified: HTN, HLD, Hypertriglyceridemia, DMII and Hypothyroidism   Priority: High    Goal: Patient Stated   Start Date: 06/11/2020  Expected End Date: 12/10/2020  This Visit's Progress: On track  Priority: High  Note:   Conditions to be addressed/monitored:  HTN, HLD, Hypertriglyceridemia, DMII and Hypothyroidism   Current Barriers:  . Unable to achieve control of Diabetes  . Weather limiting enjoyment of exercise and outdoor activities . Increased stress due to COVID-19 . She would like to be on less prescription medications   Pharmacist Clinical Goal(s):  [name] Over the next 30 days, patient will achieve control of diabetes as evidenced by A1c < 7% through collaboration with PharmD and provider.   Interventions: . 1:1 collaboration with [name], [name], MD regarding development and update of comprehensive plan of care as evidenced by provider attestation and co-signature . Inter-disciplinary care team collaboration (see longitudinal plan of care) . Comprehensive medication review performed; medication list updated in electronic medical record  Hypertension (BP goal <140/90) -controlled -Current treatment:  Losartan 25 mg daily after breakfast -Medications previously tried:  none -Current home readings: reports she kept log for 7 days after last PCP appt, ranging 130-135/ 79-82. Last checked 2 weeks ago. -Current exercise habits: has resumed Charlean Sanfilippo 2-3 days per week, walks her long driveway to [employer] and back (1/2 mile) 2-3 days weekly -Denies hypotensive/hypertensive symptoms -Educated on Exercise goal of 150 minutes per week; Proper BP monitoring technique; does not use table salt -Counseled to monitor BP at home once monthly, document, and provide log at future appointments  -Recommended to  continue current medication Set goal of exercising 5 days per week  Hyperlipidemia: (LDL goal < 70) -controlled -Current treatment:  Atovastatin 10 mg - 1/2 tablet daily  Fenofibrate 54 mg - 1 tablet daily  Omega 3 fatty acids -  2 times a week -Medications previously tried: none  Reports triglycerides came down from 800s after starting fenofibrate. Pt reports she eats fish twice weekly or more. Denies any concerns with current medications.  -Educated on: Discussed benefits of omega-3 acids. Due to her frequent fish intake, she may stop the omega-3 fatty acid to reduce number of medications if desired. - Recommended to continue current medications  Diabetes (A1c goal <7%) -uncontrolled -Current medications:  Metformin 1000 mg - 1 tablet twice daily -Medications previously tried: metformin dose recently increased 04/17/20 per Dr. Glori Bickers She gradually increased to 2000 mg, has been on the 2000 mg for about 2 weeks now. Denies any adverse effects. -Current home glucose readings - not checking blood glucose  . fasting glucose: none . post prandial glucose:none -Denies hypoglycemic/hyperglycemic symptoms -Current meal patterns:  Trying to eat less bread and carbohydrates (potatoes). Broiling chicken and meats rather than fried.  . breakfast: Special K or cheerios with fresh strawberries/half of a banana and 2% milk, 2 boiled eggs and 1/2 piece of  toast . lunch: 1/2 banana sandwich with 1 piece of bread . dinner: baked pork chop, tilapia or salmon, or chicken breast and salad or broccoli, squash, or low carb vegetables  . snacks:  . drinks: Has a coke every once in a while, does not any in the house currently, loves orange juice (4 ounces 2-3 times a week) -Educated on: Carbohydrate counting (limits to 45-60 grams of carbs for meals and 15-30 gm for snacks -Counseled on diet and exercise extensively Recommended to continue current medication    Hypothyroidism (Goal: TSH WNL) -controlled -Current treatment   Levothyroxine 125 mcg daily before breakfast -Medications previously tried: none -Recommended to continue current medication  Patient Goals/Self-Care Activities . Over the next 30 days, patient will:  - target a minimum of 150 minutes of moderate intensity exercise weekly bring BP log to next PCP appointment      Medication Assistance: None required.  Patient affirms current coverage meets needs.  Patient's preferred pharmacy is:  [place], [place] - [address] [place] [address] Phone: [phone] Fax: [phone]  Uses pill box? Yes Pt endorses 100% compliance  Follow Up:  Patient agrees to Care Plan and Follow-up.  Plan: Next PCP appointment scheduled for: 07/17/20  We will review labs at that time and determine follow up as needed.  Debbora Dus, PharmD Clinical Pharmacist [facility] [phone]   I have personally reviewed this encounter including the documentation in this note and have collaborated with the care management provider regarding care management and care coordination activities to include development and update of the comprehensive care plan. I am certifying that I agree with the content of this note and encounter as supervising physician.   Loura Pardon MD

## 2020-06-11 NOTE — Patient Instructions (Addendum)
Dear Carla Mccoy,  Below is a summary of the goals we discussed during our follow up appointment on June 11, 2020. Please contact me anytime with questions or concerns.   Goals Addressed            This Visit's Progress   . Lifestyle Change-Diabetes       Timeframe:  Long-Range Goal Priority:  High Start Date:   06/11/2020                          Expected End Date:   none                    Follow Up Date 12/10/2020 9:30 AM   - Exercise at least 5 days a week, with a goal of 150 minutes weekly    Why is this important?    The changes that you are asked to make may be hard to do.   This is especially true when the changes are life-long.   Knowing why it is important to you is the first step.   Working on the change with your family or support person helps you not feel alone.   Reward yourself and family or support person when goals are met. This can be an activity you choose like bowling, hiking, biking, swimming or shooting hoops.     Notes: Activities include Charlean Sanfilippo and walking 1/2 mile around house       Patient Care Plan: [place]    Problem Identified: HTN, HLD, Hypertriglyceridemia, DMII and Hypothyroidism   Priority: High    Goal: Patient Stated   Start Date: 06/11/2020  Expected End Date: 12/10/2020  This Visit's Progress: On track  Priority: High  Note:   Conditions to be addressed/monitored:  HTN, HLD, Hypertriglyceridemia, DMII and Hypothyroidism   Current Barriers:  . Unable to achieve control of Diabetes  . Weather limiting enjoyment of exercise and outdoor activities . Increased stress due to COVID-19 . She would like to be on less prescription medications   Pharmacist Clinical Goal(s):  [name] Over the next 30 days, patient will achieve control of diabetes as evidenced by A1c < 7% through collaboration with PharmD and provider.   Interventions: . 1:1 collaboration with [name], [name], MD regarding development and update of  comprehensive plan of care as evidenced by provider attestation and co-signature . Inter-disciplinary care team collaboration (see longitudinal plan of care) . Comprehensive medication review performed; medication list updated in electronic medical record  Hypertension (BP goal <140/90) -controlled -Current treatment:  Losartan 25 mg daily after breakfast -Medications previously tried: none -Current home readings: reports she kept log for 7 days after last PCP appt, ranging 130-135/ 79-82. Last checked 2 weeks ago. -Current exercise habits: has resumed Charlean Sanfilippo 2-3 days per week, walks her long driveway to [employer] and back (1/2 mile) 2-3 days weekly -Denies hypotensive/hypertensive symptoms -Educated on Exercise goal of 150 minutes per week; Proper BP monitoring technique; does not use table salt -Counseled to monitor BP at home once monthly, document, and provide log at future appointments  -Recommended to continue current medication Set goal of exercising 5 days per week  Hyperlipidemia: (LDL goal < 70) -controlled -Current treatment:  Atovastatin 10 mg - 1/2 tablet daily  Fenofibrate 54 mg - 1 tablet daily  Omega 3 fatty acids -  2 times a week -Medications previously tried: none  Reports triglycerides came down from 800s  after starting fenofibrate. Pt reports she eats fish twice weekly or more. Denies any concerns with current medications.  -Educated on: Discussed benefits of omega-3 acids. Due to her frequent fish intake, she may stop the omega-3 fatty acid to reduce number of medications if desired. - Recommended to continue current medications  Diabetes (A1c goal <7%) -uncontrolled -Current medications:  Metformin 1000 mg - 1 tablet twice daily -Medications previously tried: metformin dose recently increased 04/17/20 per Dr. Glori Bickers She gradually increased to 2000 mg, has been on the 2000 mg for about 2 weeks now. Denies any adverse effects. -Current home glucose  readings - not checking blood glucose  . fasting glucose: none . post prandial glucose:none -Denies hypoglycemic/hyperglycemic symptoms -Current meal patterns:  Trying to eat less bread and carbohydrates (potatoes). Broiling chicken and meats rather than fried.  . breakfast: Special K or cheerios with fresh strawberries/half of a banana and 2% milk, 2 boiled eggs and 1/2 piece of toast . lunch: 1/2 banana sandwich with 1 piece of bread . dinner: baked pork chop, tilapia or salmon, or chicken breast and salad or broccoli, squash, or low carb vegetables  . snacks:  . drinks: Has a coke every once in a while, does not any in the house currently, loves orange juice (4 ounces 2-3 times a week) -Educated on: Carbohydrate counting (limits to 45-60 grams of carbs for meals and 15-30 gm for snacks -Counseled on diet and exercise extensively Recommended to continue current medication    Hypothyroidism (Goal: TSH WNL) -controlled -Current treatment   Levothyroxine 125 mcg daily before breakfast -Medications previously tried: none -Recommended to continue current medication  Patient Goals/Self-Care Activities . Over the next 30 days, patient will:  - target a minimum of 150 minutes of moderate intensity exercise weekly bring BP log to next PCP appointment     Debbora Dus, PharmD Clinical Pharmacist [facility] [phone]   Basics of Medicine Management Taking your medicines correctly is an important part of managing or preventing medical problems. Make sure you know what disease or condition your medicine is treating, and how and when to take it. If you do not take your medicine correctly, it may not work well and may cause unpleasant side effects, including serious health problems. What should I do when I am taking medicines?  Read all the labels and inserts that come with your medicines. Review the information often.  Talk with your pharmacist if you get  a refill and notice a change in the size, color, or shape of your medicines.  Know the potential side effects for each medicine that you take.  Try to get all your medicines from the same pharmacy. The pharmacist will have all your information and will understand how your medicines will affect each other (interact).  Tell your health care provider about all your medicines, including over-the-counter medicines, vitamins, and herbal or dietary supplements. He or she will make sure that nothing will interact with any of your prescribed medicines.   How can I take my medicines safely?  Take medicines only as told by your health care provider. ? Do not take more of your medicine than instructed. ? Do not take anyone else's medicines. ? Do not share your medicines with others. ? Do not stop taking your medicines unless your health care provider tells you to do so. ? You may need to avoid alcohol or certain foods or liquids when taking certain medicines. Follow your health care provider's instructions.  Do not split, mash, or chew your medicines unless your health care provider tells you to do so. Tell your health care provider if you have trouble swallowing your medicines.  For liquid medicine, use the dosing container that was provided. How should I organize my medicines? Know your medicines  Know what each of your medicines looks like. This includes size, color, and shape. Tell your health care provider if you are having trouble recognizing all the medicines that you are taking.  If you cannot tell your medicines apart because they look similar, keep them in original bottles.  If you cannot read the labels on the bottles, tell your pharmacist to put your medicines in containers with large print.  Review your medicines and your schedule with family members, a friend, or a caregiver. Use a pill organizer  Use a tool to organize your medicine schedule. Tools include a weekly pillbox, a  written chart, a notebook, or a calendar.  Your tool should help you remember the following things about each medicine: ? The name of the medicine. ? The amount (dose) to take. ? The schedule. This is the day and time the medicine should be taken. ? The appearance. This includes color, shape, size, and stamp. ? How to take your medicines. This includes instructions to take them with food, without food, with fluids, or with other medicines.  Create reminders for taking your medicines. Use sticky notes, or alarms on your watch, mobile device, or phone calendar.  You may choose to use a more advanced management system. These systems have storage, alarms, and visual and audio prompts.  Some medicines can be taken on an "as-needed" basis. These include medicines for nausea or pain. If you take an as-needed medicine, write down the name and dose, as well as the date and time that you took it.   How should I plan for travel?  Take your pillbox, medicines, and organization system with you when traveling.  Have your medicines refilled before you travel. This will ensure that you do not run out of your medicines while you are away from home.  Always carry an updated list of your medicines with you. If there is an emergency, a first responder can quickly see what medicines you are taking.  Do not pack your medicines in checked luggage in case your luggage is lost or delayed.  If any of your medicines is considered a controlled substance, make sure you bring a letter from your health care provider with you. How should I store and discard my medicines? For safe storage:  Store medicines in a cool, dry area away from light, or as directed by your health care provider. Do not store medicines in the bathroom. Heat and humidity will affect them.  Do not store your medicines with other chemicals, or with medicines for pets or other household members.  Keep medicines away from children and pets. Do not  leave them on counters or bedside tables. Store them in high cabinets or on high shelves. For safe disposal:  Check expiration dates regularly. Do not take expired medicines. Discard medicines that are older than the expiration date.  Learn a safe way to dispose of your medicines. You may: ? Use a local government, hospital, or pharmacy medicine-take-back program. ? Mix the medicines with inedible substances, put them in a sealed bag or empty container, and throw them in the trash. What should I remember?  Tell your health care provider if you: ? Experience side effects. ?  Have new symptoms. ? Have other concerns about taking your medicines.  Review your medicines regularly with your health care provider. Other medicines, diet, medical conditions, weight changes, and daily habits can all affect how medicines work. Ask if you need to continue taking each medicine, and discuss how well each one is working.  Refill your medicines early to avoid running out of them.  In case of an accidental overdose, call your local [facility] at [phone] or visit your local emergency department immediately. This is important. Summary  Taking your medicines correctly is an important part of managing or preventing medical problems.  You need to make sure that you understand what you are taking a medicine for, as well as how and when you need to take it.  Know your medicines and use a pill organizer to help you take your medicines correctly.  In case of an accidental overdose, call your local [facility] at [phone] or visit your local emergency department immediately. This is important. This information is not intended to replace advice given to you by your health care provider. Make sure you discuss any questions you have with your health care provider. Document Revised: 04/16/2017 Document Reviewed: 04/16/2017 Elsevier Patient Education  2021 [employer].

## 2020-06-26 ENCOUNTER — Other Ambulatory Visit: Payer: Self-pay | Admitting: Family Medicine

## 2020-06-26 DIAGNOSIS — Z1231 Encounter for screening mammogram for malignant neoplasm of breast: Secondary | ICD-10-CM

## 2020-07-17 ENCOUNTER — Ambulatory Visit: Payer: PPO | Admitting: Family Medicine

## 2020-07-17 ENCOUNTER — Other Ambulatory Visit: Payer: Self-pay | Admitting: Family Medicine

## 2020-07-26 ENCOUNTER — Other Ambulatory Visit: Payer: Self-pay

## 2020-07-26 ENCOUNTER — Telehealth: Payer: Self-pay

## 2020-07-26 ENCOUNTER — Telehealth (INDEPENDENT_AMBULATORY_CARE_PROVIDER_SITE_OTHER): Payer: PPO | Admitting: Family Medicine

## 2020-07-26 ENCOUNTER — Encounter: Payer: Self-pay | Admitting: Family Medicine

## 2020-07-26 DIAGNOSIS — J209 Acute bronchitis, unspecified: Secondary | ICD-10-CM | POA: Insufficient documentation

## 2020-07-26 MED ORDER — AMOXICILLIN-POT CLAVULANATE 875-125 MG PO TABS: 1.0000 | ORAL_TABLET | Freq: Two times a day (BID) | ORAL | 0 refills | Status: DC

## 2020-07-26 MED ORDER — HYDROCOD POLST-CPM POLST ER 10-8 MG/5ML PO SUER: 5.0000 mL | Freq: Two times a day (BID) | ORAL | 0 refills | Status: DC | PRN

## 2020-07-26 MED ORDER — ALBUTEROL SULFATE HFA 108 (90 BASE) MCG/ACT IN AERS: 2.0000 | INHALATION_SPRAY | RESPIRATORY_TRACT | 0 refills | Status: DC | PRN

## 2020-07-26 MED ORDER — PREDNISONE 10 MG PO TABS: ORAL_TABLET | ORAL | 0 refills | Status: DC

## 2020-07-26 NOTE — Patient Instructions (Signed)
Drink fluids and rest  Take augmentin and prednisone as directed (prednisone may make you feel hyper/hungry and it temporarily raises blood sugar)  Use inhaler as needed tussionex for cough as needed with caution of sedation   If short of breath or worse wheezing/severe please go to the ER  Update if not starting to improve in a week or if worsening   If you want to do another covid test let us know

## 2020-07-26 NOTE — Telephone Encounter (Signed)
Per appt notes pt has video appt with Dr Glori Bickers 07/26/20 at 9:30.

## 2020-07-26 NOTE — Telephone Encounter (Signed)
[facility] Night - Client Nonclinical Telephone Record AccessNurse Client [facility] Night - Client Client Site [place] Physician [name], Roque Lias - MD Contact Type Call Who Is Calling Patient / Member / Family / Caregiver Caller Name Carine Nordgren [place] Phone Number [phone] Patient Name Carla Mccoy Patient DOB 09/22/2050 Call Type Message Only Information Provided Reason for Call Request for General Office Information Initial Comment Caller reports that she has been coughing a lot and would like to possibly be seen today. Additional Comment Caller given office hours to follow up, declined triage Disp. Time Disposition Final User 07/26/2020 7:36:57 AM General Information Provided Yes Elvera Maria Call Closed By: Elvera Maria Transaction Date/Time: 07/26/2020 7:34:40 AM (ET)

## 2020-07-26 NOTE — Progress Notes (Signed)
Virtual Visit via Video Note  I connected with Carla Mccoy on 07/26/20 at  9:30 AM EDT by a video enabled telemedicine application and verified that I am speaking with the correct person using two identifiers.  Location: Patient: home Provider: office   I discussed the limitations of evaluation and management by telemedicine and the availability of in person appointments. The patient expressed understanding and agreed to proceed.  Parties involved in encounter  Patient: Carla Mccoy  Provider:  Loura Pardon MD   History of Present Illness: Pt presents with cough and congestion   For 2 weeks fighting resp symptoms   Coughing- prod of green phlegm (also from nose) Congested /sniffling  Hurting to take a deep breath-and some "spasms" Some wheezing when she coughs very hard   Last week temp went up a few times -101.4   Scratchy throat -from cough  Some ear discomfort on and off  Tinnitus all the time  Cannot taste well    Had loose stool few days ago   Home covid test negative   otc-tylenol Tessalon (had) chlorcedin for pm  Tried robitussin last week   Headaches and sinus pain    Husband is sick now also -went to clinic today   Took tessalon-no help  Has had pfizer covid imms without booster   Patient Active Problem List   Diagnosis Date Noted  . Acute bronchitis 07/26/2020  . Upper abdominal pain 04/17/2020  . Plantar fasciitis of left foot 08/26/2018  . Estrogen deficiency 11/03/2017  . Osteopenia 11/03/2017  . Leg cramps 11/18/2016  . Family history of congenital heart disease 11/18/2016  . Cough due to ACE inhibitor 11/18/2016  . Routine general medical examination at a health care facility 03/26/2015  . Colon cancer screening 03/24/2014  . Encounter for Medicare annual wellness exam 03/24/2014  . Diabetes type 2, controlled ([place]) 03/17/2014  . Benign paroxysmal positional vertigo 08/26/2013  . OSA (obstructive sleep apnea) 08/10/2013  .  Chronic mastitis of left breast 07/04/2013  . History of breast cancer 02/10/2012  . 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 BACK PAIN, CHRONIC 05/10/2007  . PEPTIC ULCER DISEASE, HX OF 05/10/2007  . HYSTERECTOMY, HX OF 05/10/2007  . TONSILLECTOMY, HX OF 05/10/2007  . Hypothyroidism 01/28/2007  . Hyperlipidemia associated with type 2 diabetes mellitus ([place]) 01/28/2007  . Overweight 01/28/2007  . COMMON MIGRAINE 01/28/2007  . Essential hypertension 01/28/2007  . FATTY LIVER DISEASE 01/28/2007  . CARPAL TUNNEL SYNDROME, HX OF 01/27/2007   Past Medical History:  Diagnosis Date  . Allergy   . 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 Arthritis   . Backache, unspecified   . breast ca dx'd 06/2009   breast cancer left - lumpectomy  . Diabetes mellitus without complication ([place])   . Full dentures   . Migraine without aura, without mention of intractable migraine without mention of status migrainosus   . Obesity, unspecified   . Other abnormal glucose   . Other and unspecified hyperlipidemia   . Other chronic nonalcoholic liver disease   . Personal history of peptic ulcer disease   . Personal history of radiation therapy   . 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 Sleep apnea   . Tachy-brady syndrome ([place])   . Unspecified essential hypertension 09/12/13   remote history; no current treatment  . Unspecified hypothyroidism   . Wears glasses    Past Surgical History:  Procedure Laterality  Date  . ABDOMINAL HYSTERECTOMY  1988  . BREAST BIOPSY Left    in the 80's - benign tissue  . BREAST LUMPECTOMY Left 2011   Feb '11  . Breast Surgery x 2 Left   . INCISION AND DRAINAGE OF WOUND Left 09/14/2013   Procedure: IRRIGATION AND DEBRIDEMENT LEFT BREAST ABSCESS;  Surgeon: Marcello Moores A. Cornett, MD;  Location: [facility];  Service: General;  Laterality: Left;  . INCISION AND DRAINAGE OF WOUND Left 12/21/2013   Procedure:  DEBRIDEMENT OF LEFT BREAST WOUND WITH PLACEMENT OF A CELL;  Surgeon: Theodoro Kos, DO;  Location: [facility];  Service: Plastics;  Laterality: Left;  . LAPAROSCOPY ABDOMEN DIAGNOSTIC    . TONSILLECTOMY    . TUBAL LIGATION  1971   Social History   Tobacco Use  . Smoking status: Never Smoker  . Smokeless tobacco: Never Used  Vaping Use  . Vaping Use: Never used  Substance Use Topics  . Alcohol use: No    Alcohol/week: 0.0 standard drinks  . Drug use: No   Family History  Problem Relation Age of Onset  . Peripheral vascular disease Mother   . Coronary artery disease Mother   . Hyperlipidemia Mother   . Heart disease Mother        CAD/fatal MI  . Diabetes Mother   . Stroke Mother   . Diabetes Sister   . Hypothyroidism Sister   . Diabetes Brother   . Hyperlipidemia Brother   . Heart disease Brother   . Hypertrophic cardiomyopathy Brother        congenital  . Colon cancer Neg Hx   . Esophageal cancer Neg Hx   . Pancreatic cancer Neg Hx   . Stomach cancer Neg Hx   . Rectal cancer Neg Hx   . Breast cancer Neg Hx    Allergies  Allergen Reactions  . Metoprolol     Lips swelled/ hands swelled and her skin turned red   . Sulfonamide Derivatives    Current Outpatient Medications on File Prior to Visit  Medication Sig Dispense Refill  . atorvastatin (LIPITOR) 10 MG tablet Take 1/2 (one-half) tablet by mouth once daily 45 tablet 3  . Cholecalciferol (VITAMIN D3) 2000 UNITS TABS Take by mouth.    . fenofibrate 54 MG tablet Take 1 tablet by mouth once daily 90 tablet 0  . levothyroxine (SYNTHROID) 125 MCG tablet Take 1 tablet by mouth once daily 90 tablet 1  . losartan (COZAAR) 25 MG tablet Take 1 tablet by mouth once daily 90 tablet 1  . Magnesium 400 MG CAPS Take 1 tablet by mouth 3 (three) times a week.    . metFORMIN (GLUCOPHAGE) 1000 MG tablet Take 1 tablet (1,000 mg total) by mouth 2 (two) times daily with a meal. 180 tablet 3  . Multiple Vitamins-Minerals  (MULTIVITAMIN,TX-MINERALS) tablet Take 1 tablet by mouth 2 (two) times a week.    . Omega-3 Fatty Acids (OMEGA 3 PO) Take 1 capsule by mouth 2 (two) times a week.     . vitamin C (ASCORBIC ACID) 500 MG tablet Take 500 mg by mouth daily.    . Vitamin E 400 units TABS Take 800 Units by mouth daily.    [name] zinc gluconate 50 MG tablet Take 50 mg by mouth 2 (two) times a week.     No current facility-administered medications on file prior to visit.   Review of Systems  Constitutional: Positive for fever and malaise/fatigue. Negative for  chills.  HENT: Positive for congestion, ear pain, sinus pain and sore throat.   Eyes: Negative for blurred vision, discharge and redness.  Respiratory: Positive for cough, sputum production and wheezing. Negative for shortness of breath and stridor.   Cardiovascular: Negative for chest pain, palpitations and leg swelling.  Gastrointestinal: Negative for abdominal pain, diarrhea, nausea and vomiting.  Musculoskeletal: Negative for myalgias.  Skin: Negative for rash.  Neurological: Positive for headaches. Negative for dizziness.    Observations/Objective: Patient appears well, in no distress Weight is baseline  No facial swelling or asymmetry Hoarse voice with some sneezing and throat clearning No obvious tremor or mobility impairment Moving neck and UEs normally Able to hear the call well  Productive sounding cough, frequent Not sob with conversation  Talkative and mentally sharp with no cognitive changes No skin changes on face or neck , no rash or pallor Affect is normal    Assessment and Plan: Problem List Items Addressed This Visit      Respiratory   Acute bronchitis    With sinusitis 2 wk after start of viral uri and neg home covid test  Prednisone 30 mg taper px (disc side eff) Albuterol mdi sent (has used before) augmentin for 7d  Fluids/rest  tussionex for cough prn with caution of sedation ER parameters discussed Update if not starting  to improve in a week or if worsening            Follow Up Instructions: Drink fluids and rest  Take augmentin and prednisone as directed (prednisone may make you feel hyper/hungry and it temporarily raises blood sugar)  Use inhaler as needed tussionex for cough as needed with caution of sedation   If short of breath or worse wheezing/severe please go to the ER  Update if not starting to improve in a week or if worsening   If you want to do another covid test let us know     I discussed the assessment and treatment plan with the patient. The patient was provided an opportunity to ask questions and all were answered. The patient agreed with the plan and demonstrated an understanding of the instructions.   The patient was advised to call back or seek an in-person evaluation if the symptoms worsen or if the condition fails to improve as anticipated.     Loura Pardon, MD

## 2020-07-26 NOTE — Assessment & Plan Note (Signed)
With sinusitis 2 wk after start of viral uri and neg home covid test  Prednisone 30 mg taper px (disc side eff) Albuterol mdi sent (has used before) augmentin for 7d  Fluids/rest  tussionex for cough prn with caution of sedation ER parameters discussed Update if not starting to improve in a week or if worsening

## 2020-07-27 NOTE — Telephone Encounter (Signed)
Per chart review pt had video visit with Dr Glori Bickers on 07/26/20.

## 2020-08-07 ENCOUNTER — Other Ambulatory Visit: Payer: Self-pay

## 2020-08-07 ENCOUNTER — Ambulatory Visit (INDEPENDENT_AMBULATORY_CARE_PROVIDER_SITE_OTHER): Payer: PPO | Admitting: Family Medicine

## 2020-08-07 ENCOUNTER — Encounter: Payer: Self-pay | Admitting: Family Medicine

## 2020-08-07 VITALS — BP 135/70 | HR 72 | Temp 96.9°F | Ht 65.75 in | Wt 169.2 lb

## 2020-08-07 DIAGNOSIS — E119 Type 2 diabetes mellitus without complications: Secondary | ICD-10-CM | POA: Diagnosis not present

## 2020-08-07 DIAGNOSIS — E785 Hyperlipidemia, unspecified: Secondary | ICD-10-CM | POA: Diagnosis not present

## 2020-08-07 DIAGNOSIS — J209 Acute bronchitis, unspecified: Secondary | ICD-10-CM | POA: Diagnosis not present

## 2020-08-07 DIAGNOSIS — I10 Essential (primary) hypertension: Secondary | ICD-10-CM | POA: Diagnosis not present

## 2020-08-07 DIAGNOSIS — E1169 Type 2 diabetes mellitus with other specified complication: Secondary | ICD-10-CM | POA: Diagnosis not present

## 2020-08-07 LAB — POCT GLYCOSYLATED HEMOGLOBIN (HGB A1C): Hemoglobin A1C: 6.5 % — AB (ref 4.0–5.6)

## 2020-08-07 MED ORDER — HYDROCOD POLST-CPM POLST ER 10-8 MG/5ML PO SUER: 5.0000 mL | Freq: Two times a day (BID) | ORAL | 0 refills | Status: DC | PRN

## 2020-08-07 NOTE — Progress Notes (Signed)
Subjective:    Patient ID: Carla Mccoy, female    DOB: [birth year], 70 y.o.   MRN: [id number]   This visit occurred during the SARS-CoV-2 public health emergency.  Safety protocols were in place, including screening questions prior to the visit, additional usage of staff PPE, and extensive cleaning of exam room while observing appropriate contact time as indicated for disinfecting solutions.    HPI Pt presents for f/u of chronic health problems   Wt Readings from Last 3 Encounters:  08/07/20 169 lb 4 oz (76.8 kg)  04/17/20 175 lb 9 oz (79.6 kg)  07/22/19 181 lb 8 oz (82.3 kg)   27.53 kg/m   Still a little cough from bronchitis  Needs refill of tussionex   Doing well with health habits Has lost some weight  Less bread -that is better   Eats fruit instead of processed carbs  Avoids sweets  Avoids snack foods  occ juice (just 4 oz) is careful   Exercise - was off kilter with L arm and leg injury  Was 30 minutes and now trying to do 15 minutes Loves to walk  Uses exercise video  Gardens and will be tending chickens  HTN bp is stable today  No cp or palpitations or headaches or edema  No side effects to medicines  Takes losartan 25 mg daily BP Readings from Last 3 Encounters:  08/07/20 135/70  04/17/20 128/74  07/22/19 132/70    Pulse Readings from Last 3 Encounters:  08/07/20 72  04/17/20 72  07/22/19 70    DM2 Lab Results  Component Value Date   HGBA1C 7.3 (H) 04/12/2020   This was up at the last visit  Goal to increase metformin to 1000 mg bid  Also add some exercise  Does not check readings   Today Lab Results  Component Value Date   HGBA1C 6.5 (A) 08/07/2020   Would like to get off metformin eventually  Strong family hx of DM2    Eye exam 11/21 On statin and arb  Last lipids Lab Results  Component Value Date   CHOL 118 04/12/2020   HDL 37.90 (L) 04/12/2020   LDLCALC 46 04/12/2020   LDLDIRECT 70.0 05/07/2016   TRIG 173.0 (H)  04/12/2020   CHOLHDL 3 04/12/2020   Atorvastatin 5 mg daily and fenofibrate 54 mg daily  Feels better with lifestyle change   Patient Active Problem List   Diagnosis Date Noted  . Acute bronchitis 07/26/2020  . Upper abdominal pain 04/17/2020  . Plantar fasciitis of left foot 08/26/2018  . Estrogen deficiency 11/03/2017  . Osteopenia 11/03/2017  . Leg cramps 11/18/2016  . Family history of congenital heart disease 11/18/2016  . Cough due to ACE inhibitor 11/18/2016  . Routine general medical examination at a health care facility 03/26/2015  . Colon cancer screening 03/24/2014  . Encounter for Medicare annual wellness exam 03/24/2014  . Diabetes type 2, controlled ([place]) 03/17/2014  . Benign paroxysmal positional vertigo 08/26/2013  . OSA (obstructive sleep apnea) 08/10/2013  . Chronic mastitis of left breast 07/04/2013  . History of breast cancer 02/10/2012  . 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 BACK PAIN, CHRONIC 05/10/2007  . PEPTIC ULCER DISEASE, HX OF 05/10/2007  . HYSTERECTOMY, HX OF 05/10/2007  . TONSILLECTOMY, HX OF 05/10/2007  . Hypothyroidism 01/28/2007  . Hyperlipidemia associated with type 2 diabetes mellitus ([place]) 01/28/2007  . Overweight 01/28/2007  . COMMON MIGRAINE 01/28/2007  . Essential hypertension 01/28/2007  . FATTY LIVER DISEASE 01/28/2007  .  CARPAL TUNNEL SYNDROME, HX OF 01/27/2007   Past Medical History:  Diagnosis Date  . Allergy   . 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 Arthritis   . Backache, unspecified   . breast ca dx'd 06/2009   breast cancer left - lumpectomy  . Diabetes mellitus without complication ([place])   . Full dentures   . Migraine without aura, without mention of intractable migraine without mention of status migrainosus   . Obesity, unspecified   . Other abnormal glucose   . Other and unspecified hyperlipidemia   . Other chronic nonalcoholic liver disease   . Personal history of peptic ulcer disease   . Personal  history of radiation therapy   . 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 Sleep apnea   . Tachy-brady syndrome ([place])   . Unspecified essential hypertension 09/12/13   remote history; no current treatment  . Unspecified hypothyroidism   . Wears glasses    Past Surgical History:  Procedure Laterality Date  . ABDOMINAL HYSTERECTOMY  1988  . BREAST BIOPSY Left    in the 80's - benign tissue  . BREAST LUMPECTOMY Left 2011   Feb '11  . Breast Surgery x 2 Left   . INCISION AND DRAINAGE OF WOUND Left 09/14/2013   Procedure: IRRIGATION AND DEBRIDEMENT LEFT BREAST ABSCESS;  Surgeon: Marcello Moores A. Cornett, MD;  Location: [facility];  Service: General;  Laterality: Left;  . INCISION AND DRAINAGE OF WOUND Left 12/21/2013   Procedure: DEBRIDEMENT OF LEFT BREAST WOUND WITH PLACEMENT OF A CELL;  Surgeon: Theodoro Kos, DO;  Location: [facility];  Service: Plastics;  Laterality: Left;  . LAPAROSCOPY ABDOMEN DIAGNOSTIC    . TONSILLECTOMY    . TUBAL LIGATION  1971   Social History   Tobacco Use  . Smoking status: Never Smoker  . Smokeless tobacco: Never Used  Vaping Use  . Vaping Use: Never used  Substance Use Topics  . Alcohol use: No    Alcohol/week: 0.0 standard drinks  . Drug use: No   Family History  Problem Relation Age of Onset  . Peripheral vascular disease Mother   . Coronary artery disease Mother   . Hyperlipidemia Mother   . Heart disease Mother        CAD/fatal MI  . Diabetes Mother   . Stroke Mother   . Diabetes Sister   . Hypothyroidism Sister   . Diabetes Brother   . Hyperlipidemia Brother   . Heart disease Brother   . Hypertrophic cardiomyopathy Brother        congenital  . Colon cancer Neg Hx   . Esophageal cancer Neg Hx   . Pancreatic cancer Neg Hx   . Stomach cancer Neg Hx   . Rectal cancer Neg Hx   . Breast cancer Neg Hx    Allergies  Allergen Reactions  . Metoprolol     Lips swelled/ hands  swelled and her skin turned red   . Sulfonamide Derivatives    Current Outpatient Medications on File Prior to Visit  Medication Sig Dispense Refill  . atorvastatin (LIPITOR) 10 MG tablet Take 1/2 (one-half) tablet by mouth once daily 45 tablet 3  . Cholecalciferol (VITAMIN D3) 2000 UNITS TABS Take by mouth.    . fenofibrate 54 MG tablet Take 1 tablet by mouth once daily 90 tablet 0  . levothyroxine (SYNTHROID) 125 MCG tablet Take 1 tablet by mouth  once daily 90 tablet 1  . losartan (COZAAR) 25 MG tablet Take 1 tablet by mouth once daily 90 tablet 1  . Magnesium 400 MG CAPS Take 1 tablet by mouth 3 (three) times a week.    . metFORMIN (GLUCOPHAGE) 1000 MG tablet Take 1 tablet (1,000 mg total) by mouth 2 (two) times daily with a meal. 180 tablet 3  . Multiple Vitamins-Minerals (MULTIVITAMIN,TX-MINERALS) tablet Take 1 tablet by mouth 2 (two) times a week.    . Omega-3 Fatty Acids (OMEGA 3 PO) Take 1 capsule by mouth 2 (two) times a week.     . vitamin C (ASCORBIC ACID) 500 MG tablet Take 500 mg by mouth daily.    . Vitamin E 400 units TABS Take 800 Units by mouth daily.    [name] zinc gluconate 50 MG tablet Take 50 mg by mouth 2 (two) times a week.     No current facility-administered medications on file prior to visit.     Review of Systems  Constitutional: Negative for activity change, appetite change, fatigue, fever and unexpected weight change.  HENT: Negative for congestion, ear pain, rhinorrhea, sinus pressure and sore throat.   Eyes: Negative for pain, redness and visual disturbance.  Respiratory: Positive for cough. Negative for shortness of breath and wheezing.   Cardiovascular: Negative for chest pain and palpitations.  Gastrointestinal: Negative for abdominal pain, blood in stool, constipation and diarrhea.  Endocrine: Negative for polydipsia and polyuria.  Genitourinary: Negative for dysuria, frequency and urgency.  Musculoskeletal: Positive for arthralgias. Negative for back pain  and myalgias.       Chronic L arm pain / tendonitis  Skin: Negative for pallor and rash.  Allergic/Immunologic: Negative for environmental allergies.  Neurological: Negative for dizziness, syncope and headaches.  Hematological: Negative for adenopathy. Does not bruise/bleed easily.  Psychiatric/Behavioral: Negative for decreased concentration and dysphoric mood. The patient is not nervous/anxious.        Objective:   Physical Exam Constitutional:      General: She is not in acute distress.    Appearance: Normal appearance. She is well-developed and normal weight.  HENT:     Head: Normocephalic and atraumatic.  Eyes:     Conjunctiva/sclera: Conjunctivae normal.     Pupils: Pupils are equal, round, and reactive to light.  Neck:     Thyroid: No thyromegaly.     Vascular: No carotid bruit or JVD.  Cardiovascular:     Rate and Rhythm: Normal rate and regular rhythm.     Heart sounds: Normal heart sounds. No gallop.   Pulmonary:     Effort: Pulmonary effort is normal. No respiratory distress.     Breath sounds: Normal breath sounds. No wheezing or rales.  Abdominal:     General: Bowel sounds are normal. There is no distension or abdominal bruit.     Palpations: Abdomen is soft. There is no mass.     Tenderness: There is no abdominal tenderness.  Musculoskeletal:     Cervical back: Normal range of motion and neck supple.     Right lower leg: No edema.     Left lower leg: No edema.  Lymphadenopathy:     Cervical: No cervical adenopathy.  Skin:    General: Skin is warm and dry.     Coloration: Skin is not pale.     Findings: No erythema or rash.  Neurological:     Mental Status: She is alert.     Coordination: Coordination normal.  Deep Tendon Reflexes: Reflexes are normal and symmetric. Reflexes normal.  Psychiatric:        Mood and Affect: Mood normal.           Assessment & Plan:   Problem List Items Addressed This Visit      Cardiovascular and Mediastinum    Essential hypertension    bp in fair control at this time  BP Readings from Last 1 Encounters:  08/07/20 135/70   No changes needed Most recent labs reviewed  Disc lifstyle change with low sodium diet and exercise  Plan to continue losartan 25 mg daily       Relevant Orders   Comprehensive metabolic panel     Respiratory   Acute bronchitis    Still slight residual cough  tussionex refilled with caution of sedation and habit        Endocrine   Hyperlipidemia associated with type 2 diabetes mellitus ([place])    Disc goals for lipids and reasons to control them Rev last labs with pt Rev low sat fat diet in detail Controlled with atorvastatin 5 mg and fenofibrate 54 mg daily  Commended better lifestyle  Re check in 3 mo      Relevant Orders   Lipid panel   Diabetes type 2, controlled ([place]) - Primary    Notable improvement with better diet and exercise and increased metformin dose to 1000 mg bid Lab Results  Component Value Date   HGBA1C 6.5 (A) 08/07/2020   Commended On arb and statin  Eye exam is up to date        Relevant Orders   POCT glycosylated hemoglobin (Hb A1C) (Completed)   Hemoglobin A1c

## 2020-08-07 NOTE — Patient Instructions (Addendum)
A1C looks good! Keep up the good work   No change in medicines  Try to get most of your carbohydrates from produce (with the exception of white potatoes)  Eat less bread/pasta/rice/snack foods/cereals/sweets and other items from the middle of the grocery store (processed carbs)  Follow up in 3 months with labs prior

## 2020-08-07 NOTE — Assessment & Plan Note (Signed)
bp in fair control at this time  BP Readings from Last 1 Encounters:  08/07/20 135/70   No changes needed Most recent labs reviewed  Disc lifstyle change with low sodium diet and exercise  Plan to continue losartan 25 mg daily

## 2020-08-07 NOTE — Assessment & Plan Note (Signed)
Notable improvement with better diet and exercise and increased metformin dose to 1000 mg bid Lab Results  Component Value Date   HGBA1C 6.5 (A) 08/07/2020   Commended On arb and statin  Eye exam is up to date

## 2020-08-07 NOTE — Assessment & Plan Note (Signed)
Still slight residual cough  tussionex refilled with caution of sedation and habit

## 2020-08-07 NOTE — Assessment & Plan Note (Signed)
Disc goals for lipids and reasons to control them Rev last labs with pt Rev low sat fat diet in detail Controlled with atorvastatin 5 mg and fenofibrate 54 mg daily  Commended better lifestyle  Re check in 3 mo

## 2020-08-08 ENCOUNTER — Ambulatory Visit: Payer: PPO | Admitting: Family Medicine

## 2020-08-17 ENCOUNTER — Other Ambulatory Visit: Payer: Self-pay

## 2020-08-17 ENCOUNTER — Ambulatory Visit
Admission: RE | Admit: 2020-08-17 | Discharge: 2020-08-17 | Disposition: A | Discharge status: Home / Self Care | Payer: PPO | Source: Ambulatory Visit | Attending: Family Medicine | Admitting: Family Medicine

## 2020-08-17 DIAGNOSIS — Z1231 Encounter for screening mammogram for malignant neoplasm of breast: Secondary | ICD-10-CM | POA: Diagnosis not present

## 2020-09-03 ENCOUNTER — Telehealth: Payer: Self-pay

## 2020-09-03 NOTE — Chronic Care Management (AMB) (Addendum)
Chronic Care Management Pharmacy Assistant   Name: Carla Mccoy  MRN: [id number] DOB: [birth year]  Reason for Encounter: Disease State Review   Conditions to be addressed/monitored: HTN and DMII   Recent office visits:  08/07/2020  Dr.Marne [name] PCP  Stopped albuterol 108 (patient preference) Stopped amoxicillin 875-125(completed course) stopped prednisone mg (completed course). Continue Losartan 62m - 1 tablet daily. Labs ordered. Follow up 3 months.  Recent consult visits:  none since last CCM contact  Hospital visits:  None in previous 6 months  Medications: Outpatient Encounter Medications as of 09/03/2020  Medication Sig   atorvastatin (LIPITOR) 10 MG tablet Take 1/2 (one-half) tablet by mouth once daily   chlorpheniramine-HYDROcodone (TUSSIONEX PENNKINETIC ER) 10-8 MG/5ML SUER Take 5 mLs by mouth every 12 (twelve) hours as needed for cough. Caution of sedation   Cholecalciferol (VITAMIN D3) 2000 UNITS TABS Take by mouth.   fenofibrate 54 MG tablet Take 1 tablet by mouth once daily   levothyroxine (SYNTHROID) 125 MCG tablet Take 1 tablet by mouth once daily   losartan (COZAAR) 25 MG tablet Take 1 tablet by mouth once daily   Magnesium 400 MG CAPS Take 1 tablet by mouth 3 (three) times a week.   metFORMIN (GLUCOPHAGE) 1000 MG tablet Take 1 tablet (1,000 mg total) by mouth 2 (two) times daily with a meal.   Multiple Vitamins-Minerals (MULTIVITAMIN,TX-MINERALS) tablet Take 1 tablet by mouth 2 (two) times a week.   Omega-3 Fatty Acids (OMEGA 3 PO) Take 1 capsule by mouth 2 (two) times a week.    vitamin C (ASCORBIC ACID) 500 MG tablet Take 500 mg by mouth daily.   Vitamin E 400 units TABS Take 800 Units by mouth daily.   zinc gluconate 50 MG tablet Take 50 mg by mouth 2 (two) times a week.   No facility-administered encounter medications on file as of 09/03/2020.    Recent Office Vitals: BP Readings from Last 3 Encounters:  08/07/20 135/70  04/17/20 128/74  07/22/19  132/70   Pulse Readings from Last 3 Encounters:  08/07/20 72  04/17/20 72  07/22/19 70    Wt Readings from Last 3 Encounters:  08/07/20 169 lb 4 oz (76.8 kg)  04/17/20 175 lb 9 oz (79.6 kg)  07/22/19 181 lb 8 oz (82.3 kg)     Kidney Function Lab Results  Component Value Date/Time   CREATININE 1.03 04/12/2020 08:15 AM   CREATININE 0.96 07/22/2019 08:56 AM   CREATININE 1.0 08/05/2012 08:24 AM   CREATININE 1.0 02/05/2012 10:23 AM   GFR 54.82 (L) 04/12/2020 08:15 AM   GFRNONAA >60 05/31/2009 01:00 PM   GFRAA  05/31/2009 01:00 PM    >60        The eGFR has been calculated using the MDRD equation. This calculation has not been validated in all clinical situations. eGFR's persistently <60 mL/min signify possible Chronic Kidney Disease.    BMP Latest Ref Rng & Units 04/12/2020 07/22/2019 01/11/2019  Glucose 70 - 99 mg/dL 134(H) 143(H) 146(H)  BUN 6 - 23 mg/dL 15 15 13   Creatinine 0.40 - 1.20 mg/dL 1.03 0.96 0.91  Sodium 135 - 145 mEq/L 139 138 140  Potassium 3.5 - 5.1 mEq/L 4.5 4.5 4.3  Chloride 96 - 112 mEq/L 104 105 106  CO2 19 - 32 mEq/L 28 27 28   Calcium 8.4 - 10.5 mg/dL 9.6 9.9 9.4   Left msg to return call  09/03/2020, 09/13/2020, 09/17/2020. Unsuccessful outreach to the patient.  Current antihypertensive  regimen:   Losartan 25 mg daily after breakfast   Recent Relevant Labs: Lab Results  Component Value Date/Time   HGBA1C 6.5 (A) 08/07/2020 08:11 AM   HGBA1C 7.3 (H) 04/12/2020 08:15 AM   HGBA1C 7.1 (H) 07/22/2019 08:56 AM   MICROALBUR <0.7 11/02/2015 08:01 AM    Kidney Function Lab Results  Component Value Date/Time   CREATININE 1.03 04/12/2020 08:15 AM   CREATININE 0.96 07/22/2019 08:56 AM   CREATININE 1.0 08/05/2012 08:24 AM   CREATININE 1.0 02/05/2012 10:23 AM   GFR 54.82 (L) 04/12/2020 08:15 AM   GFRNONAA >60 05/31/2009 01:00 PM   GFRAA  05/31/2009 01:00 PM    >60        The eGFR has been calculated using the MDRD equation. This calculation has not  been validated in all clinical situations. eGFR's persistently <60 mL/min signify possible Chronic Kidney Disease.   Current antihyperglycemic regimen:  Metformin 1000 mg - 1 tablet twice daily    On insulin? No  Adherence Review: Is the patient currently on a STATIN medication? Yes Is the patient currently on ACE/ARB medication? Yes Does the patient have >5 day gap between last estimated fill dates? CPP to review  Star Rating Drugs:  Medication:  Last Fill: Day Supply Atorvastatin 81m. 07/17/2020 90ds  Losartan 268m 06/01/2020 90ds  Metformin 100042m3/15/2022 90ds  Follow-Up:  Pharmacist Review  MicDebbora DusPP notified  VelAvel SensorMBaton Rouge Behavioral Hospitalinical Pharmacy Assistant [phone] have reviewed the care management and care coordination activities outlined in this encounter and I am certifying that I agree with the content of this note. Attempt to reach patient again in June for BP and BG review.  MicDebbora DusharmD Clinical Pharmacist [phone]

## 2020-09-07 ENCOUNTER — Other Ambulatory Visit: Payer: Self-pay | Admitting: Family Medicine

## 2020-09-27 ENCOUNTER — Other Ambulatory Visit: Payer: Self-pay | Admitting: Family Medicine

## 2020-10-08 ENCOUNTER — Telehealth: Payer: Self-pay

## 2020-10-08 NOTE — Chronic Care Management (AMB) (Addendum)
Chronic Care Management Pharmacy Assistant   Name: AMIAH FROHLICH  MRN: [id number] DOB: [birth year]  Reason for Encounter: Disease State  - DM  Recent office visits:  None since last CCM contact  Recent consult visits:  None since last CCM contact  Hospital visits:  None in previous 6 months  Medications: Outpatient Encounter Medications as of 10/08/2020  Medication Sig   atorvastatin (LIPITOR) 10 MG tablet Take 1/2 (one-half) tablet by mouth once daily   chlorpheniramine-HYDROcodone (TUSSIONEX PENNKINETIC ER) 10-8 MG/5ML SUER Take 5 mLs by mouth every 12 (twelve) hours as needed for cough. Caution of sedation   Cholecalciferol (VITAMIN D3) 2000 UNITS TABS Take by mouth.   fenofibrate 54 MG tablet Take 1 tablet by mouth once daily   levothyroxine (SYNTHROID) 125 MCG tablet Take 1 tablet by mouth once daily   losartan (COZAAR) 25 MG tablet Take 1 tablet by mouth once daily   Magnesium 400 MG CAPS Take 1 tablet by mouth 3 (three) times a week.   metFORMIN (GLUCOPHAGE) 1000 MG tablet Take 1 tablet (1,000 mg total) by mouth 2 (two) times daily with a meal.   Multiple Vitamins-Minerals (MULTIVITAMIN,TX-MINERALS) tablet Take 1 tablet by mouth 2 (two) times a week.   Omega-3 Fatty Acids (OMEGA 3 PO) Take 1 capsule by mouth 2 (two) times a week.    vitamin C (ASCORBIC ACID) 500 MG tablet Take 500 mg by mouth daily.   Vitamin E 400 units TABS Take 800 Units by mouth daily.   zinc gluconate 50 MG tablet Take 50 mg by mouth 2 (two) times a week.   No facility-administered encounter medications on file as of 10/08/2020.   Recent Relevant Labs: Lab Results  Component Value Date/Time   HGBA1C 6.5 (A) 08/07/2020 08:11 AM   HGBA1C 7.3 (H) 04/12/2020 08:15 AM   HGBA1C 7.1 (H) 07/22/2019 08:56 AM   MICROALBUR <0.7 11/02/2015 08:01 AM    Kidney Function Lab Results  Component Value Date/Time   CREATININE 1.03 04/12/2020 08:15 AM   CREATININE 0.96 07/22/2019 08:56 AM   CREATININE 1.0  08/05/2012 08:24 AM   CREATININE 1.0 02/05/2012 10:23 AM   GFR 54.82 (L) 04/12/2020 08:15 AM   GFRNONAA >60 05/31/2009 01:00 PM   GFRAA  05/31/2009 01:00 PM    >60        The eGFR has been calculated using the MDRD equation. This calculation has not been validated in all clinical situations. eGFR's persistently <60 mL/min signify possible Chronic Kidney Disease.   Attempted contact with Lesle Chris Mamaril 3 times on 10/16/20,10/23/20,10/24/20. Unsuccessful outreach. Will attempt contact next month.   Adherence Review: Is the patient currently on a STATIN medication? Yes Is the patient currently on ACE/ARB medication? Yes Does the patient have >5 day gap between last estimated fill dates? No  Star Rating Drugs:  Medication:  Last Fill: Day Supply Atorvastatin 24m.      07/17/2020        90ds     Losartan 216m           09/29/2020        90ds     Metformin 100030m    07/17/2020        90ds   Follow-Up:  Pharmacist Review  MicDebbora DusPP notified  VelAvel SensorMPrisma Health Gre[facility] Hospitalinical Pharmacy Assistant [phone] have reviewed the care management and care coordination activities outlined in this encounter and I am certifying that I agree with the content of  this note. No further action required.  Debbora Dus, PharmD Clinical Pharmacist [facility] at [facility] [phone]

## 2020-10-30 ENCOUNTER — Other Ambulatory Visit: Payer: Self-pay

## 2020-10-30 ENCOUNTER — Other Ambulatory Visit (INDEPENDENT_AMBULATORY_CARE_PROVIDER_SITE_OTHER): Payer: PPO

## 2020-10-30 DIAGNOSIS — E785 Hyperlipidemia, unspecified: Secondary | ICD-10-CM

## 2020-10-30 DIAGNOSIS — I10 Essential (primary) hypertension: Secondary | ICD-10-CM

## 2020-10-30 DIAGNOSIS — E119 Type 2 diabetes mellitus without complications: Secondary | ICD-10-CM | POA: Diagnosis not present

## 2020-10-30 DIAGNOSIS — E1169 Type 2 diabetes mellitus with other specified complication: Secondary | ICD-10-CM | POA: Diagnosis not present

## 2020-10-30 LAB — LIPID PANEL
Cholesterol: 110 mg/dL (ref 0–200)
HDL: 36.7 mg/dL — ABNORMAL LOW (ref >39.00)
LDL Cholesterol: 48 mg/dL (ref 0–99)
NonHDL: 73.1
Total CHOL/HDL Ratio: 3
Triglycerides: 125 mg/dL (ref 0.0–149.0)
VLDL: 25 mg/dL (ref 0.0–40.0)

## 2020-10-30 LAB — COMPREHENSIVE METABOLIC PANEL
ALT: 14 U/L (ref 0–35)
AST: 18 U/L (ref 0–37)
Albumin: 4.2 g/dL (ref 3.5–5.2)
Alkaline Phosphatase: 31 U/L — ABNORMAL LOW (ref 39–117)
BUN: 14 mg/dL (ref 6–23)
CO2: 27 mEq/L (ref 19–32)
Calcium: 9.6 mg/dL (ref 8.4–10.5)
Chloride: 106 mEq/L (ref 96–112)
Creatinine, Ser: 0.95 mg/dL (ref 0.40–1.20)
GFR: 60.17 mL/min (ref >60.00)
Glucose, Bld: 118 mg/dL — ABNORMAL HIGH (ref 70–99)
Potassium: 4.2 mEq/L (ref 3.5–5.1)
Sodium: 140 mEq/L (ref 135–145)
Total Bilirubin: 0.4 mg/dL (ref 0.2–1.2)
Total Protein: 6.9 g/dL (ref 6.0–8.3)

## 2020-10-30 LAB — HEMOGLOBIN A1C: Hgb A1c MFr Bld: 6.4 % (ref 4.6–6.5)

## 2020-11-06 ENCOUNTER — Encounter: Payer: Self-pay | Admitting: Family Medicine

## 2020-11-06 ENCOUNTER — Other Ambulatory Visit: Payer: Self-pay

## 2020-11-06 ENCOUNTER — Ambulatory Visit (INDEPENDENT_AMBULATORY_CARE_PROVIDER_SITE_OTHER): Payer: PPO | Admitting: Family Medicine

## 2020-11-06 VITALS — BP 116/66 | HR 66 | Temp 97.6°F | Ht 65.75 in | Wt 170.0 lb

## 2020-11-06 DIAGNOSIS — E785 Hyperlipidemia, unspecified: Secondary | ICD-10-CM

## 2020-11-06 DIAGNOSIS — E119 Type 2 diabetes mellitus without complications: Secondary | ICD-10-CM

## 2020-11-06 DIAGNOSIS — E1169 Type 2 diabetes mellitus with other specified complication: Secondary | ICD-10-CM

## 2020-11-06 DIAGNOSIS — I10 Essential (primary) hypertension: Secondary | ICD-10-CM | POA: Diagnosis not present

## 2020-11-06 NOTE — Assessment & Plan Note (Signed)
Disc goals for lipids and reasons to control them Rev last labs with pt Rev low sat fat diet in detail Enc exercise to raise HDL Plan to continue fenofibrate 54 mg daily  Atorvastatin 5 mg daily  LDL is excellent and trig improved

## 2020-11-06 NOTE — Progress Notes (Signed)
Subjective:    Patient ID: Carla Mccoy, female    DOB: 09-Aug-1950, 70 y.o.   MRN: [id number]  This visit occurred during the SARS-CoV-2 public health emergency.  Safety protocols were in place, including screening questions prior to the visit, additional usage of staff PPE, and extensive cleaning of exam room while observing appropriate contact time as indicated for disinfecting solutions.   HPI Pt presents for f/u of chronic health problems incl DM2 and hyperlipidemia  Wt Readings from Last 3 Encounters:  11/06/20 170 lb (77.1 kg)  08/07/20 169 lb 4 oz (76.8 kg)  04/17/20 175 lb 9 oz (79.6 kg)   27.65 kg/m  Planning to get wt down to 148 lb  Eats too much cereal - now limiting to 1/2 a bowl (likes cooler food in the heat) Avoids sugar cereal  Occ sweets / infrequent   Feeling pretty good  Some aches and pains   HTN bp is stable today  No cp or palpitations or headaches or edema  No side effects to medicines  BP Readings from Last 3 Encounters:  11/06/20 116/66  08/07/20 135/70  04/17/20 128/74     Losartan 25 mg daily  DM2 Lab Results  Component Value Date   HGBA1C 6.4 10/30/2020   This is down from 6.5 last time Metformin 1000 mg bid-tolerates well  On arb and statin  Eye exam 11/21  Eating well  Does not check blood sugar  Getting exercise- yard work   Hyperlipidemia Lab Results  Component Value Date   CHOL 110 10/30/2020   CHOL 118 04/12/2020   CHOL 126 07/22/2019   Lab Results  Component Value Date   HDL 36.70 (L) 10/30/2020   HDL 37.90 (L) 04/12/2020   HDL 40.60 07/22/2019   Lab Results  Component Value Date   LDLCALC 48 10/30/2020   LDLCALC 46 04/12/2020   LDLCALC 54 07/22/2019   Lab Results  Component Value Date   TRIG 125.0 10/30/2020   TRIG 173.0 (H) 04/12/2020   TRIG 157.0 (H) 07/22/2019   Lab Results  Component Value Date   CHOLHDL 3 10/30/2020   CHOLHDL 3 04/12/2020   CHOLHDL 3 07/22/2019   Lab Results  Component  Value Date   LDLDIRECT 70.0 05/07/2016   LDLDIRECT 85.0 11/02/2015   LDLDIRECT 93.0 03/19/2015   Fenofibrate 54 mg daily  Atorvastatin 5 mg daily  Improved trig  LDL is 48  Patient Active Problem List   Diagnosis Date Noted   Upper abdominal pain 04/17/2020   Plantar fasciitis of left foot 08/26/2018   Estrogen deficiency 11/03/2017   Osteopenia 11/03/2017   Leg cramps 11/18/2016   Family history of congenital heart disease 11/18/2016   Cough due to ACE inhibitor 11/18/2016   Routine general medical examination at a health care facility 03/26/2015   Colon cancer screening 03/24/2014   Encounter for Medicare annual wellness exam 03/24/2014   Diabetes type 2, controlled ([place]) 03/17/2014   Benign paroxysmal positional vertigo 08/26/2013   OSA (obstructive sleep apnea) 08/10/2013   Chronic mastitis of left breast 07/04/2013   History of breast cancer 02/10/2012   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   BACK PAIN, CHRONIC 05/10/2007   PEPTIC ULCER DISEASE, HX OF 05/10/2007   HYSTERECTOMY, HX OF 05/10/2007   TONSILLECTOMY, HX OF 05/10/2007   Hypothyroidism 01/28/2007   Hyperlipidemia associated with type 2 diabetes mellitus ([place]) 01/28/2007   Overweight 01/28/2007   COMMON MIGRAINE 01/28/2007   Essential hypertension 01/28/2007   FATTY LIVER  DISEASE 01/28/2007   CARPAL TUNNEL SYNDROME, HX OF 01/27/2007   Past Medical History:  Diagnosis Date   Allergy    Angioneurotic edema not elsewhere classified    ACE-I   Arthritis    Backache, unspecified    breast ca dx'd 06/2009   breast cancer left - lumpectomy   Diabetes mellitus without complication ([place])    Full dentures    Migraine without aura, without mention of intractable migraine without mention of status migrainosus    Obesity, unspecified    Other abnormal glucose    Other and unspecified hyperlipidemia    Other chronic nonalcoholic liver disease    Personal history of peptic ulcer disease    Personal history of radiation  therapy    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 Cornett, MD;  Location: [facility];  Service: General;  Laterality: Left;   INCISION AND DRAINAGE OF WOUND Left 12/21/2013   Procedure: DEBRIDEMENT OF LEFT BREAST WOUND WITH PLACEMENT OF A CELL;  Surgeon: Theodoro Kos, DO;  Location: [facility];  Service: Plastics;  Laterality: Left;   LAPAROSCOPY ABDOMEN DIAGNOSTIC     TONSILLECTOMY     TUBAL LIGATION  1971   Social History   Tobacco Use   Smoking status: Never   Smokeless tobacco: Never  Vaping Use   Vaping Use: Never used  Substance Use Topics   Alcohol use: No    Alcohol/week: 0.0 standard drinks   Drug use: No   Family History  Problem Relation Age of Onset   Peripheral vascular disease Mother    Coronary artery disease Mother    Hyperlipidemia Mother    Heart disease Mother        CAD/fatal MI   Diabetes Mother    Stroke Mother    Diabetes Sister    Hypothyroidism Sister    Diabetes Brother    Hyperlipidemia Brother    Heart disease Brother    Hypertrophic cardiomyopathy Brother        congenital   Colon cancer Neg Hx    Esophageal cancer Neg Hx    Pancreatic cancer Neg Hx    Stomach cancer Neg Hx    Rectal cancer Neg Hx    Breast cancer Neg Hx    Allergies  Allergen Reactions   Metoprolol     Lips swelled/ hands swelled and her skin turned red    Sulfonamide Derivatives     Current Outpatient Medications on File Prior to Visit  Medication Sig Dispense Refill   atorvastatin (LIPITOR) 10 MG tablet Take 1/2 (one-half) tablet by mouth once daily 45 tablet 3   Cholecalciferol (VITAMIN D3) 2000 UNITS TABS Take by mouth.     fenofibrate 54 MG tablet Take 1 tablet by mouth once daily 90 tablet 0   levothyroxine (SYNTHROID) 125 MCG tablet Take 1 tablet  by mouth once daily 90 tablet 1   losartan (COZAAR) 25 MG tablet Take 1 tablet by mouth once daily 90 tablet 0   Magnesium 400 MG CAPS Take 1 tablet by mouth 3 (three) times a week.     metFORMIN (GLUCOPHAGE) 1000 MG tablet Take 1 tablet (1,000 mg total) by mouth 2 (two) times daily with a meal. 180 tablet 3   Multiple Vitamins-Minerals (MULTIVITAMIN,TX-MINERALS) tablet Take 1 tablet by mouth 2 (two) times a week.     Omega-3 Fatty Acids (OMEGA 3 PO) Take 1 capsule by mouth 2 (two) times a week.      vitamin C (ASCORBIC ACID) 500 MG tablet Take 500 mg by mouth daily.     Vitamin E 400 units TABS Take 800 Units by mouth daily.     zinc gluconate 50 MG tablet Take 50 mg by mouth 2 (two) times a week.     No current facility-administered medications on file prior to visit.    Review of Systems  Constitutional:  Negative for activity change, appetite change, fatigue, fever and unexpected weight change.  HENT:  Negative for congestion, ear pain, rhinorrhea, sinus pressure and sore throat.   Eyes:  Negative for pain, redness and visual disturbance.  Respiratory:  Negative for cough, shortness of breath and wheezing.   Cardiovascular:  Negative for chest pain and palpitations.  Gastrointestinal:  Negative for abdominal pain, blood in stool, constipation and diarrhea.  Endocrine: Negative for polydipsia and polyuria.  Genitourinary:  Negative for dysuria, frequency and urgency.  Musculoskeletal:  Negative for arthralgias, back pain and myalgias.  Skin:  Negative for pallor and rash.  Allergic/Immunologic: Negative for  environmental allergies.  Neurological:  Negative for dizziness, syncope and headaches.  Hematological:  Negative for adenopathy. Does not bruise/bleed easily.  Psychiatric/Behavioral:  Negative for decreased concentration and dysphoric mood. The patient is not nervous/anxious.       Objective:   Physical Exam Constitutional:      General: She is not in acute distress.    Appearance: Normal appearance. She is well-developed and normal weight. She is not ill-appearing.  HENT:     Head: Normocephalic and atraumatic.  Eyes:     Conjunctiva/sclera: Conjunctivae normal.     Pupils: Pupils are equal, round, and reactive to light.  Neck:     Thyroid: No thyromegaly.     Vascular: No carotid bruit or JVD.  Cardiovascular:     Rate and Rhythm: Normal rate and regular rhythm.     Heart sounds: Normal heart sounds.    No gallop.  Pulmonary:     Effort: Pulmonary effort is normal. No respiratory distress.     Breath sounds: Normal breath sounds. No wheezing or rales.  Abdominal:     General: Bowel sounds are normal. There is no distension or abdominal bruit.     Palpations: Abdomen is soft. There is no mass.     Tenderness: There is no abdominal tenderness.  Musculoskeletal:     Cervical back: Normal range of motion and neck supple.     Right lower leg: No edema.     Left lower leg: No edema.  Lymphadenopathy:     Cervical: No cervical adenopathy.  Skin:    General: Skin is warm and dry.     Coloration: Skin is not pale.     Findings: No rash.  Neurological:     Mental Status: She is alert.     Coordination: Coordination normal.  Deep Tendon Reflexes: Reflexes are normal and symmetric. Reflexes normal.  Psychiatric:        Mood and Affect: Mood normal.        Cognition and Memory: Cognition and memory normal.          Assessment & Plan:   Problem List Items Addressed This Visit       Cardiovascular and Mediastinum   Essential hypertension    bp in fair control at  this time  BP Readings from Last 1 Encounters:  11/06/20 116/66  No changes needed Most recent labs reviewed  Disc lifstyle change with low sodium diet and exercise  Plan to continue losartan 25 mg daily         Endocrine   Hyperlipidemia associated with type 2 diabetes mellitus ([place])    Disc goals for lipids and reasons to control them Rev last labs with pt Rev low sat fat diet in detail Enc exercise to raise HDL Plan to continue fenofibrate 54 mg daily  Atorvastatin 5 mg daily  LDL is excellent and trig improved        Diabetes type 2, controlled ([place]) - Primary    Lab Results  Component Value Date   HGBA1C 6.4 10/30/2020  Very good control with metformin 1000 mg bid  On arb On statin  Working on wt loss

## 2020-11-06 NOTE — Assessment & Plan Note (Signed)
bp in fair control at this time  BP Readings from Last 1 Encounters:  11/06/20 116/66   No changes needed Most recent labs reviewed  Disc lifstyle change with low sodium diet and exercise  Plan to continue losartan 25 mg daily

## 2020-11-06 NOTE — Patient Instructions (Signed)
Keep up the good work  Stay active  Wear shoes in the yard   Try to get most of your carbohydrates from produce (with the exception of white potatoes)  Eat less bread/pasta/rice/snack foods/cereals/sweets and other items from the middle of the grocery store (processed carbs) Avoid red meat/ fried foods/ egg yolks/ fatty breakfast meats/ butter, cheese and high fat dairy/ and shellfish

## 2020-11-06 NOTE — Assessment & Plan Note (Signed)
Lab Results  Component Value Date   HGBA1C 6.4 10/30/2020   Very good control with metformin 1000 mg bid  On arb On statin  Working on wt loss

## 2020-12-03 ENCOUNTER — Other Ambulatory Visit: Payer: Self-pay | Admitting: Family Medicine

## 2020-12-04 ENCOUNTER — Telehealth: Payer: Self-pay

## 2020-12-04 NOTE — Chronic Care Management (AMB) (Addendum)
    Chronic Care Management Pharmacy Assistant   Name: Carla Mccoy  MRN: [id number] DOB: 06/16/1950  Reason for Encounter: CCM Reminder Call   Conditions to be addressed/monitored: HTN, HLD, and DMII   Medications: Outpatient Encounter Medications as of 12/04/2020  Medication Sig   atorvastatin (LIPITOR) 10 MG tablet Take 1/2 (one-half) tablet by mouth once daily   Cholecalciferol (VITAMIN D3) 2000 UNITS TABS Take by mouth.   fenofibrate 54 MG tablet Take 1 tablet by mouth once daily   levothyroxine (SYNTHROID) 125 MCG tablet Take 1 tablet by mouth once daily   losartan (COZAAR) 25 MG tablet Take 1 tablet by mouth once daily   Magnesium 400 MG CAPS Take 1 tablet by mouth 3 (three) times a week.   metFORMIN (GLUCOPHAGE) 1000 MG tablet Take 1 tablet (1,000 mg total) by mouth 2 (two) times daily with a meal.   Multiple Vitamins-Minerals (MULTIVITAMIN,TX-MINERALS) tablet Take 1 tablet by mouth 2 (two) times a week.   Omega-3 Fatty Acids (OMEGA 3 PO) Take 1 capsule by mouth 2 (two) times a week.    vitamin C (ASCORBIC ACID) 500 MG tablet Take 500 mg by mouth daily.   Vitamin E 400 units TABS Take 800 Units by mouth daily.   zinc gluconate 50 MG tablet Take 50 mg by mouth 2 (two) times a week.   No facility-administered encounter medications on file as of 12/04/2020.    Carla Mccoy did not answer the telephone to remind her of her upcoming telephone visit with Debbora Dus on 12/10/2020 at 9:30am .  she was reminded to have all medications, supplements and any blood glucose and blood pressure readings available for review at appointment. Left message for patient.  Star Rating Drugs: Medication:  Last Fill: Day Supply Losartan '25mg'$  09/27/20 90 Atorvastatin '10mg'$  10/12/20 90 Metformin '1000mg'$  10/18/20 90  Next PCP appointment on 01/31/2021  Debbora Dus, CPP notified  Avel Sensor, [place] Assistant [phone]  I have reviewed the care management and care  coordination activities outlined in this encounter and I am certifying that I agree with the content of this note. No further action required.  Debbora Dus, PharmD Clinical Pharmacist [facility] at [facility] [phone]

## 2020-12-10 ENCOUNTER — Other Ambulatory Visit: Payer: Self-pay

## 2020-12-10 ENCOUNTER — Ambulatory Visit (INDEPENDENT_AMBULATORY_CARE_PROVIDER_SITE_OTHER): Payer: PPO

## 2020-12-10 DIAGNOSIS — E119 Type 2 diabetes mellitus without complications: Secondary | ICD-10-CM | POA: Diagnosis not present

## 2020-12-10 DIAGNOSIS — I10 Essential (primary) hypertension: Secondary | ICD-10-CM

## 2020-12-10 NOTE — Patient Instructions (Addendum)
Dear Carla Mccoy,  Below is a summary of the goals we discussed during our follow up appointment on December 10, 2020. Please contact me anytime with questions or concerns.   Visit Information  Patient Care Plan: CCM Pharmacy Care Plan     Problem Identified: Disease Management   Priority: High     Long-Range Goal: Patient Stated   Start Date: 06/11/2020  Expected End Date: 12/10/2020  This Visit's Progress: On track  Recent Progress: On track  Priority: High  Note:      Current Barriers:  None identified   Pharmacist Clinical Goal(s):  Patient will contact provider office for questions/concerns as evidenced notation of same in electronic health record through collaboration with PharmD and provider.   Interventions: 1:1 collaboration with [name], Carla Fanny, MD regarding development and update of comprehensive plan of care as evidenced by provider attestation and co-signature Inter-disciplinary care team collaboration (see longitudinal plan of care) Comprehensive medication review performed; medication list updated in electronic medical record  Interventions: 1:1 collaboration with [name], Carla Fanny, MD regarding development and update of comprehensive plan of care as evidenced by provider attestation and co-signature Inter-disciplinary care team collaboration (see longitudinal plan of care) Comprehensive medication review performed; medication list updated in electronic medical record  Hypertension (BP goal <140/90) -Controlled - per clinic and home readings -Current treatment: Losartan 25 mg - 1 tablet daily after breakfast -Medications previously tried: none -Current home readings: 132/67 (recent home check), 128/65  -Denies hypotensive/hypertensive symptoms. Denies falls. -She does not use table salt. She drinks a lot of water. Stays active outside. -Counseled to monitor BP at home at least once monthly, document, and provide log at future appointments -Recommended to continue  current medication  Hyperlipidemia: (LDL goal < 70) -Controlled - LDL 48 (10/30/20) -Current treatment: Atovastatin 10 mg - 1/2 tablet daily Fenofibrate 54 mg - 1 tablet daily Omega 3 fatty acids - 2-3 days per week -Medications previously tried: none  -Pertinent history - Reports triglycerides came down from 800s after starting fenofibrate.  -Assessed adherence - patient confirms and refill timely -Recommended to continue current medications  Diabetes (A1c goal <7%) -Controlled - A1c  6.4% (11/2020) -Current medications: Metformin 1000 mg - 1 tablet twice daily -Medications previously tried: metformin dose increased to 2000 mg/day 04/17/20 per Dr. Glori Bickers -Current home glucose readings - not checking blood glucose  -Denies hypoglycemic/hyperglycemic symptoms -Diet: Limits starches, increased salads/vegetables from her garden -Exercise: Keeping great granddaughter, stays active watching her, mows lawn, keeps garden -She is working hard on weight loss and limiting carbs, happy with improved A1c. Her goal is to come off metformin. -Foot exam completed by PCP 10/2020, no abnormalities Recommended to continue current medication   Patient Goals/Self-Care Activities Over the next 12 months, patient will:  - Continue current medications as prescribed. - Contact CCM team with medication concerns  Follow Up Plan: Telephone follow up appointment with care management team member scheduled for:  12 months or sooner if needed      Patient verbalizes understanding of instructions provided today and agrees to view in [place].   Debbora Dus, PharmD Clinical Pharmacist [facility] at [facility] [phone]

## 2020-12-10 NOTE — Progress Notes (Signed)
 Chronic Care Management Pharmacy Note  12/10/2020 Name:  Carla Mccoy MRN:  [id number] DOB:  05/21/1950  Summary: DM, HTN, and LDL well controlled.  She is doing well with diet and exercise. No medication concerns identified.  Recommendations: None  Plan: Follow up 12 months or sooner if needed  Subjective: Carla Mccoy is an 69 y.o. year old female who is a primary patient of [name], Marne A, MD.  The CCM team was consulted for assistance with disease management and care coordination needs.    Engaged with patient by telephone for follow up visit in response to provider referral for pharmacy case management and/or care coordination services.   Consent to Services:  The patient was given information about Chronic Care Management services, agreed to services, and gave verbal consent prior to initiation of services.  Please see initial visit note for detailed documentation.   Patient Care Team: [name], Marne A, MD as PCP - General (Family Medicine) Romine, Cynthia P, MD (Obstetrics and Gynecology) Wall, Thomas C, MD (Inactive) (Cardiology) Love, James M, MD (Neurology) Patterson, David R, MD (Gastroenterology) Magrinat, Gustav C, MD (Hematology and Oncology) Cornett, Thomas, MD (General Surgery) Yoakum, John, OD (Optometry) Groat, Robert, MD (Ophthalmology) Eveline Sauve, RPH as Pharmacist (Pharmacist)  Recent office visits: 11/06/20 - PCP - Pt presented for diabetes follow up. A1c 6.4% on metformin 1000 mg BID. LDL excellent on atorvastatin, fenofibrate. BP fair control on ARB. Continue current medications.  Recent consult visits: None in previous 6 months  Hospital visits: None in previous 6 months   Objective:  Lab Results  Component Value Date   CREATININE 0.95 10/30/2020   BUN 14 10/30/2020   GFR 60.17 10/30/2020   GFRNONAA >60 05/31/2009   GFRAA  05/31/2009    >60        The eGFR has been calculated using the MDRD equation. This calculation has not  been validated in all clinical situations. eGFR's persistently <60 mL/min signify possible Chronic Kidney Disease.   NA 140 10/30/2020   K 4.2 10/30/2020   CALCIUM 9.6 10/30/2020   CO2 27 10/30/2020   GLUCOSE 118 (H) 10/30/2020    Lab Results  Component Value Date/Time   HGBA1C 6.4 10/30/2020 07:39 AM   HGBA1C 6.5 (A) 08/07/2020 08:11 AM   HGBA1C 7.3 (H) 04/12/2020 08:15 AM   GFR 60.17 10/30/2020 07:39 AM   GFR 54.82 (L) 04/12/2020 08:15 AM   MICROALBUR <0.7 11/02/2015 08:01 AM    Last diabetic Eye exam:  Lab Results  Component Value Date/Time   HMDIABEYEEXA No Retinopathy 03/05/2020 12:00 AM    Last diabetic Foot exam: 10/2020 normal   Lab Results  Component Value Date   CHOL 110 10/30/2020   HDL 36.70 (L) 10/30/2020   LDLCALC 48 10/30/2020   LDLDIRECT 70.0 05/07/2016   TRIG 125.0 10/30/2020   CHOLHDL 3 10/30/2020    Hepatic Function Latest Ref Rng & Units 10/30/2020 04/12/2020 07/22/2019  Total Protein 6.0 - 8.3 g/dL 6.9 7.0 7.1  Albumin 3.5 - 5.2 g/dL 4.2 4.2 4.1  AST 0 - 37 U/L 18 23 17  ALT 0 - 35 U/L 14 18 14  Alk Phosphatase 39 - 117 U/L 31(L) 36(L) 42  Total Bilirubin 0.2 - 1.2 mg/dL 0.4 0.5 0.5  Bilirubin, Direct 0.0 - 0.3 mg/dL - - -    Lab Results  Component Value Date/Time   TSH 2.92 04/12/2020 08:15 AM   TSH 2.13 01/11/2019 08:15 AM   FREET4 1.18 03/22/2013    Chronic Care Management Pharmacy Note  12/10/2020 Name:  Carla Mccoy MRN:  [id number] DOB:  05/21/1950  Summary: DM, HTN, and LDL well controlled.  She is doing well with diet and exercise. No medication concerns identified.  Recommendations: None  Plan: Follow up 12 months or sooner if needed  Subjective: Carla Mccoy is an 69 y.o. year old female who is a primary patient of [name], Marne A, MD.  The CCM team was consulted for assistance with disease management and care coordination needs.    Engaged with patient by telephone for follow up visit in response to provider referral for pharmacy case management and/or care coordination services.   Consent to Services:  The patient was given information about Chronic Care Management services, agreed to services, and gave verbal consent prior to initiation of services.  Please see initial visit note for detailed documentation.   Patient Care Team: [name], Marne A, MD as PCP - General (Family Medicine) Romine, Cynthia P, MD (Obstetrics and Gynecology) Wall, Thomas C, MD (Inactive) (Cardiology) Love, James M, MD (Neurology) Patterson, David R, MD (Gastroenterology) Magrinat, Gustav C, MD (Hematology and Oncology) Cornett, Thomas, MD (General Surgery) Yoakum, John, OD (Optometry) Groat, Robert, MD (Ophthalmology) Adams, Michelle, RPH as Pharmacist (Pharmacist)  Recent office visits: 11/06/20 - PCP - Pt presented for diabetes follow up. A1c 6.4% on metformin 1000 mg BID. LDL excellent on atorvastatin, fenofibrate. BP fair control on ARB. Continue current medications.  Recent consult visits: None in previous 6 months  Hospital visits: None in previous 6 months   Objective:  Lab Results  Component Value Date   CREATININE 0.95 10/30/2020   BUN 14 10/30/2020   GFR 60.17 10/30/2020   GFRNONAA >60 05/31/2009   GFRAA  05/31/2009    >60        The eGFR has been calculated using the MDRD equation. This calculation has not  been validated in all clinical situations. eGFR's persistently <60 mL/min signify possible Chronic Kidney Disease.   NA 140 10/30/2020   K 4.2 10/30/2020   CALCIUM 9.6 10/30/2020   CO2 27 10/30/2020   GLUCOSE 118 (H) 10/30/2020    Lab Results  Component Value Date/Time   HGBA1C 6.4 10/30/2020 07:39 AM   HGBA1C 6.5 (A) 08/07/2020 08:11 AM   HGBA1C 7.3 (H) 04/12/2020 08:15 AM   GFR 60.17 10/30/2020 07:39 AM   GFR 54.82 (L) 04/12/2020 08:15 AM   MICROALBUR <0.7 11/02/2015 08:01 AM    Last diabetic Eye exam:  Lab Results  Component Value Date/Time   HMDIABEYEEXA No Retinopathy 03/05/2020 12:00 AM    Last diabetic Foot exam: 10/2020 normal   Lab Results  Component Value Date   CHOL 110 10/30/2020   HDL 36.70 (L) 10/30/2020   LDLCALC 48 10/30/2020   LDLDIRECT 70.0 05/07/2016   TRIG 125.0 10/30/2020   CHOLHDL 3 10/30/2020    Hepatic Function Latest Ref Rng & Units 10/30/2020 04/12/2020 07/22/2019  Total Protein 6.0 - 8.3 g/dL 6.9 7.0 7.1  Albumin 3.5 - 5.2 g/dL 4.2 4.2 4.1  AST 0 - 37 U/L 18 23 17  ALT 0 - 35 U/L 14 18 14  Alk Phosphatase 39 - 117 U/L 31(L) 36(L) 42  Total Bilirubin 0.2 - 1.2 mg/dL 0.4 0.5 0.5  Bilirubin, Direct 0.0 - 0.3 mg/dL - - -    Lab Results  Component Value Date/Time   TSH 2.92 04/12/2020 08:15 AM   TSH 2.13 01/11/2019 08:15 AM   FREET4 1.18 03/22/2013    Chronic Care Management Pharmacy Note  12/10/2020 Name:  Carla Mccoy MRN:  [id number] DOB:  05/21/1950  Summary: DM, HTN, and LDL well controlled.  She is doing well with diet and exercise. No medication concerns identified.  Recommendations: None  Plan: Follow up 12 months or sooner if needed  Subjective: Carla Mccoy is an 69 y.o. year old female who is a primary patient of [name], Marne A, MD.  The CCM team was consulted for assistance with disease management and care coordination needs.    Engaged with patient by telephone for follow up visit in response to provider referral for pharmacy case management and/or care coordination services.   Consent to Services:  The patient was given information about Chronic Care Management services, agreed to services, and gave verbal consent prior to initiation of services.  Please see initial visit note for detailed documentation.   Patient Care Team: [name], Marne A, MD as PCP - General (Family Medicine) Romine, Cynthia P, MD (Obstetrics and Gynecology) Wall, Thomas C, MD (Inactive) (Cardiology) Love, James M, MD (Neurology) Patterson, David R, MD (Gastroenterology) Magrinat, Gustav C, MD (Hematology and Oncology) Cornett, Thomas, MD (General Surgery) Yoakum, John, OD (Optometry) Groat, Robert, MD (Ophthalmology) , , RPH as Pharmacist (Pharmacist)  Recent office visits: 11/06/20 - PCP - Pt presented for diabetes follow up. A1c 6.4% on metformin 1000 mg BID. LDL excellent on atorvastatin, fenofibrate. BP fair control on ARB. Continue current medications.  Recent consult visits: None in previous 6 months  Hospital visits: None in previous 6 months   Objective:  Lab Results  Component Value Date   CREATININE 0.95 10/30/2020   BUN 14 10/30/2020   GFR 60.17 10/30/2020   GFRNONAA >60 05/31/2009   GFRAA  05/31/2009    >60        The eGFR has been calculated using the MDRD equation. This calculation has not  been validated in all clinical situations. eGFR's persistently <60 mL/min signify possible Chronic Kidney Disease.   NA 140 10/30/2020   K 4.2 10/30/2020   CALCIUM 9.6 10/30/2020   CO2 27 10/30/2020   GLUCOSE 118 (H) 10/30/2020    Lab Results  Component Value Date/Time   HGBA1C 6.4 10/30/2020 07:39 AM   HGBA1C 6.5 (A) 08/07/2020 08:11 AM   HGBA1C 7.3 (H) 04/12/2020 08:15 AM   GFR 60.17 10/30/2020 07:39 AM   GFR 54.82 (L) 04/12/2020 08:15 AM   MICROALBUR <0.7 11/02/2015 08:01 AM    Last diabetic Eye exam:  Lab Results  Component Value Date/Time   HMDIABEYEEXA No Retinopathy 03/05/2020 12:00 AM    Last diabetic Foot exam: 10/2020 normal   Lab Results  Component Value Date   CHOL 110 10/30/2020   HDL 36.70 (L) 10/30/2020   LDLCALC 48 10/30/2020   LDLDIRECT 70.0 05/07/2016   TRIG 125.0 10/30/2020   CHOLHDL 3 10/30/2020    Hepatic Function Latest Ref Rng & Units 10/30/2020 04/12/2020 07/22/2019  Total Protein 6.0 - 8.3 g/dL 6.9 7.0 7.1  Albumin 3.5 - 5.2 g/dL 4.2 4.2 4.1  AST 0 - 37 U/L 18 23 17  ALT 0 - 35 U/L 14 18 14  Alk Phosphatase 39 - 117 U/L 31(L) 36(L) 42  Total Bilirubin 0.2 - 1.2 mg/dL 0.4 0.5 0.5  Bilirubin, Direct 0.0 - 0.3 mg/dL - - -    Lab Results  Component Value Date/Time   TSH 2.92 04/12/2020 08:15 AM   TSH 2.13 01/11/2019 08:15 AM   FREET4 1.18 03/22/2013    Chronic Care Management Pharmacy Note  12/10/2020 Name:  Carla Mccoy MRN:  [id number] DOB:  05/21/1950  Summary: DM, HTN, and LDL well controlled.  She is doing well with diet and exercise. No medication concerns identified.  Recommendations: None  Plan: Follow up 12 months or sooner if needed  Subjective: Carla Mccoy is an 69 y.o. year old female who is a primary patient of [name], Marne A, MD.  The CCM team was consulted for assistance with disease management and care coordination needs.    Engaged with patient by telephone for follow up visit in response to provider referral for pharmacy case management and/or care coordination services.   Consent to Services:  The patient was given information about Chronic Care Management services, agreed to services, and gave verbal consent prior to initiation of services.  Please see initial visit note for detailed documentation.   Patient Care Team: [name], Marne A, MD as PCP - General (Family Medicine) Romine, Cynthia P, MD (Obstetrics and Gynecology) Wall, Thomas C, MD (Inactive) (Cardiology) Love, James M, MD (Neurology) Patterson, David R, MD (Gastroenterology) Magrinat, Gustav C, MD (Hematology and Oncology) Cornett, Thomas, MD (General Surgery) Yoakum, John, OD (Optometry) Groat, Robert, MD (Ophthalmology) , , RPH as Pharmacist (Pharmacist)  Recent office visits: 11/06/20 - PCP - Pt presented for diabetes follow up. A1c 6.4% on metformin 1000 mg BID. LDL excellent on atorvastatin, fenofibrate. BP fair control on ARB. Continue current medications.  Recent consult visits: None in previous 6 months  Hospital visits: None in previous 6 months   Objective:  Lab Results  Component Value Date   CREATININE 0.95 10/30/2020   BUN 14 10/30/2020   GFR 60.17 10/30/2020   GFRNONAA >60 05/31/2009   GFRAA  05/31/2009    >60        The eGFR has been calculated using the MDRD equation. This calculation has not  been validated in all clinical situations. eGFR's persistently <60 mL/min signify possible Chronic Kidney Disease.   NA 140 10/30/2020   K 4.2 10/30/2020   CALCIUM 9.6 10/30/2020   CO2 27 10/30/2020   GLUCOSE 118 (H) 10/30/2020    Lab Results  Component Value Date/Time   HGBA1C 6.4 10/30/2020 07:39 AM   HGBA1C 6.5 (A) 08/07/2020 08:11 AM   HGBA1C 7.3 (H) 04/12/2020 08:15 AM   GFR 60.17 10/30/2020 07:39 AM   GFR 54.82 (L) 04/12/2020 08:15 AM   MICROALBUR <0.7 11/02/2015 08:01 AM    Last diabetic Eye exam:  Lab Results  Component Value Date/Time   HMDIABEYEEXA No Retinopathy 03/05/2020 12:00 AM    Last diabetic Foot exam: 10/2020 normal   Lab Results  Component Value Date   CHOL 110 10/30/2020   HDL 36.70 (L) 10/30/2020   LDLCALC 48 10/30/2020   LDLDIRECT 70.0 05/07/2016   TRIG 125.0 10/30/2020   CHOLHDL 3 10/30/2020    Hepatic Function Latest Ref Rng & Units 10/30/2020 04/12/2020 07/22/2019  Total Protein 6.0 - 8.3 g/dL 6.9 7.0 7.1  Albumin 3.5 - 5.2 g/dL 4.2 4.2 4.1  AST 0 - 37 U/L 18 23 17  ALT 0 - 35 U/L 14 18 14  Alk Phosphatase 39 - 117 U/L 31(L) 36(L) 42  Total Bilirubin 0.2 - 1.2 mg/dL 0.4 0.5 0.5  Bilirubin, Direct 0.0 - 0.3 mg/dL - - -    Lab Results  Component Value Date/Time   TSH 2.92 04/12/2020 08:15 AM   TSH 2.13 01/11/2019 08:15 AM   FREET4 1.18 03/22/2013    Chronic Care Management Pharmacy Note  12/10/2020 Name:  Carla Mccoy MRN:  [id number] DOB:  05/21/1950  Summary: DM, HTN, and LDL well controlled.  She is doing well with diet and exercise. No medication concerns identified.  Recommendations: None  Plan: Follow up 12 months or sooner if needed  Subjective: Carla Mccoy is an 69 y.o. year old female who is a primary patient of [name], Marne A, MD.  The CCM team was consulted for assistance with disease management and care coordination needs.    Engaged with patient by telephone for follow up visit in response to provider referral for pharmacy case management and/or care coordination services.   Consent to Services:  The patient was given information about Chronic Care Management services, agreed to services, and gave verbal consent prior to initiation of services.  Please see initial visit note for detailed documentation.   Patient Care Team: [name], Marne A, MD as PCP - General (Family Medicine) Romine, Cynthia P, MD (Obstetrics and Gynecology) Wall, Thomas C, MD (Inactive) (Cardiology) Love, James M, MD (Neurology) Patterson, David R, MD (Gastroenterology) Magrinat, Gustav C, MD (Hematology and Oncology) Cornett, Thomas, MD (General Surgery) Yoakum, John, OD (Optometry) Groat, Robert, MD (Ophthalmology) , , RPH as Pharmacist (Pharmacist)  Recent office visits: 11/06/20 - PCP - Pt presented for diabetes follow up. A1c 6.4% on metformin 1000 mg BID. LDL excellent on atorvastatin, fenofibrate. BP fair control on ARB. Continue current medications.  Recent consult visits: None in previous 6 months  Hospital visits: None in previous 6 months   Objective:  Lab Results  Component Value Date   CREATININE 0.95 10/30/2020   BUN 14 10/30/2020   GFR 60.17 10/30/2020   GFRNONAA >60 05/31/2009   GFRAA  05/31/2009    >60        The eGFR has been calculated using the MDRD equation. This calculation has not  been validated in all clinical situations. eGFR's persistently <60 mL/min signify possible Chronic Kidney Disease.   NA 140 10/30/2020   K 4.2 10/30/2020   CALCIUM 9.6 10/30/2020   CO2 27 10/30/2020   GLUCOSE 118 (H) 10/30/2020    Lab Results  Component Value Date/Time   HGBA1C 6.4 10/30/2020 07:39 AM   HGBA1C 6.5 (A) 08/07/2020 08:11 AM   HGBA1C 7.3 (H) 04/12/2020 08:15 AM   GFR 60.17 10/30/2020 07:39 AM   GFR 54.82 (L) 04/12/2020 08:15 AM   MICROALBUR <0.7 11/02/2015 08:01 AM    Last diabetic Eye exam:  Lab Results  Component Value Date/Time   HMDIABEYEEXA No Retinopathy 03/05/2020 12:00 AM    Last diabetic Foot exam: 10/2020 normal   Lab Results  Component Value Date   CHOL 110 10/30/2020   HDL 36.70 (L) 10/30/2020   LDLCALC 48 10/30/2020   LDLDIRECT 70.0 05/07/2016   TRIG 125.0 10/30/2020   CHOLHDL 3 10/30/2020    Hepatic Function Latest Ref Rng & Units 10/30/2020 04/12/2020 07/22/2019  Total Protein 6.0 - 8.3 g/dL 6.9 7.0 7.1  Albumin 3.5 - 5.2 g/dL 4.2 4.2 4.1  AST 0 - 37 U/L 18 23 17  ALT 0 - 35 U/L 14 18 14  Alk Phosphatase 39 - 117 U/L 31(L) 36(L) 42  Total Bilirubin 0.2 - 1.2 mg/dL 0.4 0.5 0.5  Bilirubin, Direct 0.0 - 0.3 mg/dL - - -    Lab Results  Component Value Date/Time   TSH 2.92 04/12/2020 08:15 AM   TSH 2.13 01/11/2019 08:15 AM   FREET4 1.18 03/22/2013

## 2020-12-29 ENCOUNTER — Other Ambulatory Visit: Payer: Self-pay | Admitting: Family Medicine

## 2021-01-28 ENCOUNTER — Telehealth: Payer: Self-pay | Admitting: Family Medicine

## 2021-01-28 DIAGNOSIS — E785 Hyperlipidemia, unspecified: Secondary | ICD-10-CM

## 2021-01-28 DIAGNOSIS — E1169 Type 2 diabetes mellitus with other specified complication: Secondary | ICD-10-CM

## 2021-01-28 DIAGNOSIS — E039 Hypothyroidism, unspecified: Secondary | ICD-10-CM

## 2021-01-28 DIAGNOSIS — I10 Essential (primary) hypertension: Secondary | ICD-10-CM

## 2021-01-28 DIAGNOSIS — E119 Type 2 diabetes mellitus without complications: Secondary | ICD-10-CM

## 2021-01-28 NOTE — Telephone Encounter (Signed)
-----   Message from Ellamae Sia sent at 01/16/2021  9:53 AM EDT ----- Regarding: Lab orders for Tuesday, 9.27.22 Patient is scheduled for CPX labs, please order future labs, Thanks , Karna Christmas

## 2021-01-29 ENCOUNTER — Other Ambulatory Visit: Payer: PPO

## 2021-01-29 ENCOUNTER — Ambulatory Visit: Payer: PPO

## 2021-01-31 ENCOUNTER — Encounter: Payer: Self-pay | Admitting: Family Medicine

## 2021-01-31 ENCOUNTER — Other Ambulatory Visit: Payer: PPO

## 2021-01-31 ENCOUNTER — Ambulatory Visit (INDEPENDENT_AMBULATORY_CARE_PROVIDER_SITE_OTHER): Payer: PPO | Admitting: Family Medicine

## 2021-01-31 ENCOUNTER — Other Ambulatory Visit: Payer: Self-pay

## 2021-01-31 VITALS — BP 134/70 | HR 74 | Temp 97.9°F | Ht 66.0 in | Wt 167.2 lb

## 2021-01-31 DIAGNOSIS — H903 Sensorineural hearing loss, bilateral: Secondary | ICD-10-CM | POA: Diagnosis not present

## 2021-01-31 DIAGNOSIS — Z23 Encounter for immunization: Secondary | ICD-10-CM | POA: Diagnosis not present

## 2021-01-31 DIAGNOSIS — E2839 Other primary ovarian failure: Secondary | ICD-10-CM

## 2021-01-31 DIAGNOSIS — H9190 Unspecified hearing loss, unspecified ear: Secondary | ICD-10-CM | POA: Insufficient documentation

## 2021-01-31 DIAGNOSIS — E785 Hyperlipidemia, unspecified: Secondary | ICD-10-CM | POA: Diagnosis not present

## 2021-01-31 DIAGNOSIS — I10 Essential (primary) hypertension: Secondary | ICD-10-CM | POA: Diagnosis not present

## 2021-01-31 DIAGNOSIS — E119 Type 2 diabetes mellitus without complications: Secondary | ICD-10-CM | POA: Diagnosis not present

## 2021-01-31 DIAGNOSIS — E039 Hypothyroidism, unspecified: Secondary | ICD-10-CM

## 2021-01-31 DIAGNOSIS — M8589 Other specified disorders of bone density and structure, multiple sites: Secondary | ICD-10-CM

## 2021-01-31 DIAGNOSIS — Z0000 Encounter for general adult medical examination without abnormal findings: Secondary | ICD-10-CM | POA: Diagnosis not present

## 2021-01-31 DIAGNOSIS — E1169 Type 2 diabetes mellitus with other specified complication: Secondary | ICD-10-CM

## 2021-01-31 LAB — TSH: TSH: 0.8 u[IU]/mL (ref 0.35–5.50)

## 2021-01-31 LAB — COMPREHENSIVE METABOLIC PANEL
ALT: 16 U/L (ref 0–35)
AST: 20 U/L (ref 0–37)
Albumin: 4.2 g/dL (ref 3.5–5.2)
Alkaline Phosphatase: 30 U/L — ABNORMAL LOW (ref 39–117)
BUN: 14 mg/dL (ref 6–23)
CO2: 27 mEq/L (ref 19–32)
Calcium: 9.8 mg/dL (ref 8.4–10.5)
Chloride: 105 mEq/L (ref 96–112)
Creatinine, Ser: 0.87 mg/dL (ref 0.40–1.20)
GFR: 66.75 mL/min (ref >60.00)
Glucose, Bld: 122 mg/dL — ABNORMAL HIGH (ref 70–99)
Potassium: 4.7 mEq/L (ref 3.5–5.1)
Sodium: 140 mEq/L (ref 135–145)
Total Bilirubin: 0.4 mg/dL (ref 0.2–1.2)
Total Protein: 6.9 g/dL (ref 6.0–8.3)

## 2021-01-31 LAB — LIPID PANEL
Cholesterol: 124 mg/dL (ref 0–200)
HDL: 41.7 mg/dL (ref >39.00)
LDL Cholesterol: 52 mg/dL (ref 0–99)
NonHDL: 82.13
Total CHOL/HDL Ratio: 3
Triglycerides: 153 mg/dL — ABNORMAL HIGH (ref 0.0–149.0)
VLDL: 30.6 mg/dL (ref 0.0–40.0)

## 2021-01-31 LAB — CBC WITH DIFFERENTIAL/PLATELET
Basophils Absolute: 0.1 10*3/uL (ref 0.0–0.1)
Basophils Relative: 0.9 % (ref 0.0–3.0)
Eosinophils Absolute: 0.3 10*3/uL (ref 0.0–0.7)
Eosinophils Relative: 4.3 % (ref 0.0–5.0)
HCT: 41.6 % (ref 36.0–46.0)
Hemoglobin: 13.8 g/dL (ref 12.0–15.0)
Lymphocytes Relative: 25.8 % (ref 12.0–46.0)
Lymphs Abs: 1.9 10*3/uL (ref 0.7–4.0)
MCHC: 33.1 g/dL (ref 30.0–36.0)
MCV: 91.3 fl (ref 78.0–100.0)
Monocytes Absolute: 0.5 10*3/uL (ref 0.1–1.0)
Monocytes Relative: 6.6 % (ref 3.0–12.0)
Neutro Abs: 4.7 10*3/uL (ref 1.4–7.7)
Neutrophils Relative %: 62.4 % (ref 43.0–77.0)
Platelets: 346 10*3/uL (ref 150.0–400.0)
RBC: 4.55 Mil/uL (ref 3.87–5.11)
RDW: 13 % (ref 11.5–15.5)
WBC: 7.6 10*3/uL (ref 4.0–10.5)

## 2021-01-31 LAB — HEMOGLOBIN A1C: Hgb A1c MFr Bld: 6.2 % (ref 4.6–6.5)

## 2021-01-31 MED ORDER — FENOFIBRATE 54 MG PO TABS: 54.0000 mg | ORAL_TABLET | Freq: Every day | ORAL | 3 refills | Status: DC

## 2021-01-31 MED ORDER — LOSARTAN POTASSIUM 25 MG PO TABS: 25.0000 mg | ORAL_TABLET | Freq: Every day | ORAL | 3 refills | Status: DC

## 2021-01-31 NOTE — Progress Notes (Signed)
Subjective:    Patient ID: Carla Mccoy, female    DOB: 09-16-1950, 70 y.o.   MRN: [id number]  This visit occurred during the SARS-CoV-2 public health emergency.  Safety protocols were in place, including screening questions prior to the visit, additional usage of staff PPE, and extensive cleaning of exam room while observing appropriate contact time as indicated for disinfecting solutions.   HPI Pt presents for amw and health mt visit   I have personally reviewed the Medicare Annual Wellness questionnaire and have noted 1. The patient's medical and social history 2. Their use of alcohol, tobacco or illicit drugs 3. Their current medications and supplements 4. The patient's functional ability including ADL's, fall risks, home safety risks and hearing or visual             impairment. 5. Diet and physical activities 6. Evidence for depression or mood disorders  The patients weight, height, BMI have been recorded in the chart and visual acuity is per eye clinic.  I have made referrals, counseling and provided education to the patient based review of the above and I have provided the pt with a written personalized care plan for preventive services. Reviewed and updated provider list, see scanned forms.  See scanned forms.  Routine anticipatory guidance given to patient.  See health maintenance. Colon cancer screening colonoscopy 1/19  10 y recall  Breast cancer screening  mammogram 4/22 (personal h/o breast cancer with L breast surgery and radiation) Self breast exam- no lumps /watches  h/o mastitis  Flu vaccine- will do today Covid immuized Tetanus vaccine  postponed for ins Pneumovax up to date  Zoster vaccine-had shingrix at [facility] Dexa 10/19 osteopenia  Falls-none Fractures- none Supplements-vitamin D daily Exercise - video/aerobics and walking and gardening    Advance directive-has not done yet, given materials to work on  Cognitive function addressed- see scanned  forms- and if abnormal then additional documentation follows.   Memory is better long term than short term  No major changes  Does numbers/math and finances with no problem  Very good at directions  She does read Also socializes  Cannot multi task as well as she used to   No confusion  Has not become lost in familiar areas  May need hearing aide     PMH and SH reviewed  Meds, vitals, and allergies reviewed.   ROS: See HPI.  Otherwise negative.    Weight : Wt Readings from Last 3 Encounters:  01/31/21 167 lb 4 oz (75.9 kg)  11/06/20 170 lb (77.1 kg)  08/07/20 169 lb 4 oz (76.8 kg)   26.99 kg/m Trying to loose weight  She would like to get down to 150 lb (comfortable weight)   Hearing/vision: Hearing Screening   500Hz  1000Hz  2000Hz  4000Hz   Right ear 20 20 20 20   Left ear 20 40 0 20  Vision Screening - Comments:: Last eye exam, 02/2020.   Had it tested at [facility] and noted she missed a tone also  Family has noticed her hearing loss    PHQ: Depression screen [facility] 2/9 01/31/2021 04/17/2020 01/17/2019 10/19/2017 06/26/2016  Decreased Interest 0 0 0 0 0  Down, Depressed, Hopeless 0 0 0 0 0  PHQ - 2 Score 0 0 0 0 0  Altered sleeping - 0 - 0 -  Tired, decreased energy - 0 - 0 -  Change in appetite - 0 - 0 -  Feeling bad or failure about yourself  - 0 -  0 -  Trouble concentrating - 0 - 0 -  Moving slowly or fidgety/restless - 0 - 0 -  Suicidal thoughts - 0 - 0 -  PHQ-9 Score - 0 - 0 -  Difficult doing work/chores - Not difficult at all - Not difficult at all -  Mood is good    ADLs: No problems with day to day activities Does everything  Functionality: good  Care team : Raegyn Renda-pcp Magrinat- onc Groat-oph Adams-pharmacist   Pt plans to f/u with dermatology for skin screen Is good about sun protection   HTN bp is stable today  No cp or palpitations or headaches or edema  No side effects to medicines  BP Readings from Last 3 Encounters:  01/31/21 134/70   11/06/20 116/66  08/07/20 135/70    Losartan 25 mg daily  Pulse Readings from Last 3 Encounters:  01/31/21 74  11/06/20 66  08/07/20 72     Hypothyroidism  Pt has no clinical changes No change in energy level/ hair or skin/ edema and no tremor Lab Results  Component Value Date   TSH 2.92 04/12/2020    Levothyroxine 125 mcg daily   DM2 Lab Results  Component Value Date   HGBA1C 6.4 10/30/2020   Good control  Metformin 1000 mg bid  Taking arb and statin  Foot care-good [facility] Eye exam-due in November  Diet has been fair  A little family stress in the past few weeks, thinks numbers may be up  1 tsp of sugar daily in coffee Tries to avoid sweets but loves them   Cutting back on bread (max one slice per day)  Makes good effort to eat more fruits and veggies     Hyperlipidemia Lab Results  Component Value Date   CHOL 110 10/30/2020   HDL 36.70 (L) 10/30/2020   LDLCALC 48 10/30/2020   LDLDIRECT 70.0 05/07/2016   TRIG 125.0 10/30/2020   CHOLHDL 3 10/30/2020   Good control HDL is low  Fenofibrate 54 mg daily  Atorvastatin 5 mg daily   Patient Active Problem List   Diagnosis Date Noted   Hearing loss 01/31/2021   Plantar fasciitis of left foot 08/26/2018   Estrogen deficiency 11/03/2017   Osteopenia 11/03/2017   Leg cramps 11/18/2016   Family history of congenital heart disease 11/18/2016   Cough due to ACE inhibitor 11/18/2016   Routine general medical examination at a health care facility 03/26/2015   Colon cancer screening 03/24/2014   Encounter for Medicare annual wellness exam 03/24/2014   Diabetes type 2, controlled (HCC) 03/17/2014   Benign paroxysmal positional vertigo 08/26/2013   OSA (obstructive sleep apnea) 08/10/2013   Chronic mastitis of left breast 07/04/2013   History of breast cancer 02/10/2012   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   BACK PAIN, CHRONIC 05/10/2007   PEPTIC ULCER DISEASE, HX OF 05/10/2007   HYSTERECTOMY, HX OF 05/10/2007    TONSILLECTOMY, HX OF 05/10/2007   Hypothyroidism 01/28/2007   Hyperlipidemia associated with type 2 diabetes mellitus (HCC) 01/28/2007   Overweight 01/28/2007   COMMON MIGRAINE 01/28/2007   Essential hypertension 01/28/2007   FATTY LIVER DISEASE 01/28/2007   CARPAL TUNNEL SYNDROME, HX OF 01/27/2007   Past Medical History:  Diagnosis Date   Allergy    Angioneurotic edema not elsewhere classified    ACE-I   Arthritis    Backache, unspecified    breast ca dx'd 06/2009   breast cancer left - lumpectomy   Diabetes mellitus without complication (HCC)    Full  dentures    Migraine without aura, without mention of intractable migraine without mention of status migrainosus    Obesity, unspecified    Other abnormal glucose    Other and unspecified hyperlipidemia    Other chronic nonalcoholic liver disease    Personal history of peptic ulcer disease    Personal history of radiation therapy    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 Cornett, MD;  Location: [facility];  Service: General;  Laterality: Left;   INCISION AND DRAINAGE OF WOUND Left 12/21/2013   Procedure: DEBRIDEMENT OF LEFT BREAST WOUND WITH PLACEMENT OF A CELL;  Surgeon: Wayland Denis, DO;  Location: [facility];  Service: Plastics;  Laterality: Left;   LAPAROSCOPY ABDOMEN DIAGNOSTIC     TONSILLECTOMY     TUBAL LIGATION  1971   Social History   Tobacco  Use   Smoking status: Never   Smokeless tobacco: Never  Vaping Use   Vaping Use: Never used  Substance Use Topics   Alcohol use: No    Alcohol/week: 0.0 standard drinks   Drug use: No   Family History  Problem Relation Age of Onset   Peripheral vascular disease Mother    Coronary artery disease Mother    Hyperlipidemia Mother    Heart disease Mother        CAD/fatal MI   Diabetes Mother    Stroke Mother    Diabetes Sister    Hypothyroidism Sister    Diabetes Brother    Hyperlipidemia Brother    Heart disease Brother    Hypertrophic cardiomyopathy Brother        congenital   Colon cancer Neg Hx    Esophageal cancer Neg Hx    Pancreatic cancer Neg Hx    Stomach cancer Neg Hx    Rectal cancer Neg Hx    Breast cancer Neg Hx    Allergies  Allergen Reactions   Metoprolol     Lips swelled/ hands swelled and her skin turned red    Sulfonamide Derivatives    Current Outpatient Medications on File Prior to Visit  Medication Sig Dispense Refill   atorvastatin (LIPITOR) 10 MG tablet Take 1/2 (one-half) tablet by mouth once daily 45 tablet 3   Cholecalciferol (VITAMIN D3) 2000 UNITS TABS Take by mouth.     levothyroxine (SYNTHROID) 125 MCG tablet Take 1 tablet by mouth once daily 90 tablet 1   Magnesium 400 MG CAPS Take 1 tablet by mouth 3 (three) times a week.     metFORMIN (GLUCOPHAGE) 1000 MG tablet Take 1 tablet (1,000 mg total) by mouth 2 (two) times daily with a meal. 180 tablet 3   Multiple Vitamins-Minerals (MULTIVITAMIN,TX-MINERALS) tablet Take 1 tablet by mouth 2 (two) times a week.     Omega-3 Fatty Acids (OMEGA 3 PO) Take 1 capsule by mouth 2 (two)  times a week.      vitamin C (ASCORBIC ACID) 500 MG tablet Take 500 mg by mouth daily.     Vitamin E 400 units TABS Take 800 Units by mouth daily.     zinc gluconate 50 MG tablet Take 50 mg by mouth 2 (two) times a week.     No current facility-administered medications on file prior to visit.     Review of Systems   Constitutional:  Negative for activity change, appetite change, fatigue, fever and unexpected weight change.  HENT:  Negative for congestion, ear pain, rhinorrhea, sinus pressure and sore throat.   Eyes:  Negative for pain, redness and visual disturbance.  Respiratory:  Negative for cough, shortness of breath and wheezing.   Cardiovascular:  Negative for chest pain and palpitations.  Gastrointestinal:  Negative for abdominal pain, blood in stool, constipation and diarrhea.  Endocrine: Negative for polydipsia and polyuria.  Genitourinary:  Negative for dysuria, frequency and urgency.  Musculoskeletal:  Negative for arthralgias, back pain and myalgias.  Skin:  Negative for pallor and rash.  Allergic/Immunologic: Negative for environmental allergies.  Neurological:  Negative for dizziness, syncope and headaches.  Hematological:  Negative for adenopathy. Does not bruise/bleed easily.  Psychiatric/Behavioral:  Negative for decreased concentration and dysphoric mood. The patient is not nervous/anxious.        Family stressors      Objective:   Physical Exam Constitutional:      General: She is not in acute distress.    Appearance: Normal appearance. She is well-developed and normal weight. She is not ill-appearing or diaphoretic.  HENT:     Head: Normocephalic and atraumatic.     Right Ear: Tympanic membrane, ear canal and external ear normal.     Left Ear: Tympanic membrane, ear canal and external ear normal.     Nose: Nose normal. No congestion.     Mouth/Throat:     Mouth: Mucous membranes are moist.     Pharynx: Oropharynx is clear. No posterior oropharyngeal erythema.  Eyes:     General: No scleral icterus.    Extraocular Movements: Extraocular movements intact.     Conjunctiva/sclera: Conjunctivae normal.     Pupils: Pupils are equal, round, and reactive to light.  Neck:     Thyroid: No thyromegaly.     Vascular: No carotid bruit or JVD.  Cardiovascular:     Rate and Rhythm:  Normal rate and regular rhythm.     Pulses: Normal pulses.     Heart sounds: Normal heart sounds.    No gallop.  Pulmonary:     Effort: Pulmonary effort is normal. No respiratory distress.     Breath sounds: Normal breath sounds. No wheezing.     Comments: Good air exch Chest:     Chest wall: No tenderness.  Abdominal:     General: Bowel sounds are normal. There is no distension or abdominal bruit.     Palpations: Abdomen is soft. There is no mass.     Tenderness: There is no abdominal tenderness.     Hernia: No hernia is present.  Genitourinary:    Comments: Breast exam: No mass, nodules, thickening, tenderness, bulging, retraction, inflamation, nipple discharge or skin changes noted.  No axillary or clavicular LA.     Musculoskeletal:        General: No tenderness. Normal range of motion.     Cervical back: Normal range of motion and neck supple. No rigidity. No muscular tenderness.  Right lower leg: No edema.     Left lower leg: No edema.     Comments: No kyphosis   Lymphadenopathy:     Cervical: No cervical adenopathy.  Skin:    General: Skin is warm and dry.     Coloration: Skin is not pale.     Findings: No erythema or rash.     Comments: Solar lentigines diffusely   Neurological:     Mental Status: She is alert. Mental status is at baseline.     Cranial Nerves: No cranial nerve deficit.     Motor: No abnormal muscle tone.     Coordination: Coordination normal.     Gait: Gait normal.     Deep Tendon Reflexes: Reflexes are normal and symmetric. Reflexes normal.  Psychiatric:        Mood and Affect: Mood normal.        Cognition and Memory: Cognition and memory normal.          Assessment & Plan:   Problem List Items Addressed This Visit       Cardiovascular and Mediastinum   Essential hypertension    bp in fair control at this time  BP Readings from Last 1 Encounters:  01/31/21 134/70  No changes needed Most recent labs reviewed  Disc lifstyle change  with low sodium diet and exercise  Plan to continue losartan 25 mg daily       Relevant Medications   fenofibrate 54 MG tablet   losartan (COZAAR) 25 MG tablet     Endocrine   Hypothyroidism    TSH today  Taking levothyroxine 125 mcg daily  No clinical changes       Hyperlipidemia associated with type 2 diabetes mellitus (HCC)    Disc goals for lipids and reasons to control them Rev last labs with pt Rev low sat fat diet in detail Labs ordered  Fenofibrate 54 mg daily and atorvastatin 5 mg  Is mindful about diet Exercise may help raise HDL      Relevant Medications   fenofibrate 54 MG tablet   losartan (COZAAR) 25 MG tablet   Diabetes type 2, controlled (HCC)    A1C ordered Has been well controlled with metformin 1000 mg bid and diet  Taking arb and statin  Good foot care  Eye exam due in november      Relevant Medications   losartan (COZAAR) 25 MG tablet     Nervous and Auditory   Hearing loss    Reviewed screen  Family has noticed Pt plans to return to [employer] where recent eval was to consider hearing aides         Musculoskeletal and Integument   Osteopenia    dexa 10/19  New one ordered No falls or fractures Taking vit D daily  Good exercise   Disc need for calcium/ vitamin D/ wt bearing exercise and bone density test every 2 y to monitor Disc safety/ fracture risk in detail          Other   Encounter for Medicare annual wellness exam - Primary    Reviewed health habits including diet and exercise and skin cancer prevention Reviewed appropriate screening tests for age  Also reviewed health mt list, fam hx and immunization status , as well as social and family history   See HPI Labs ordered Colonoscopy and mammogram utd  Flu vaccine given  Need to get shingrix imm dates dexa ordered, no falls or fractures taking vit D  and good exercise Given materials to work on [employer] directives  No cognitive concerns Plans to pursue further hearing  eval Eye/vision exam utd PHQ score 0 No problems doing ADLs  Sees dermatology for skin screening        Routine general medical examination at a health care facility    Reviewed health habits including diet and exercise and skin cancer prevention Reviewed appropriate screening tests for age  Also reviewed health mt list, fam hx and immunization status , as well as social and family history   See HPI Labs ordered Colonoscopy and mammogram utd  Flu vaccine given  Need to get shingrix imm dates dexa ordered, no falls or fractures taking vit D and good exercise Given materials to work on [employer] directives  No cognitive concerns Plans to pursue further hearing eval Eye/vision exam utd PHQ score 0 No problems doing ADLs  Sees dermatology for skin screening        Estrogen deficiency   Relevant Orders   DG Bone Density   Other Visit Diagnoses     Need for influenza vaccination       Relevant Orders   Flu Vaccine QUAD High Dose(Fluad) (Completed)

## 2021-01-31 NOTE — Patient Instructions (Addendum)
Call and schedule your bone density test  Please work on your advance directive (blue booklet)   Do some research on hearing aides/testing  Let us know if you want an audiology referral and where you need to go for your insurance   Flu shot today   Labs today

## 2021-02-01 NOTE — Assessment & Plan Note (Signed)
Disc goals for lipids and reasons to control them Rev last labs with pt Rev low sat fat diet in detail Labs ordered  Fenofibrate 54 mg daily and atorvastatin 5 mg  Is mindful about diet Exercise may help raise HDL

## 2021-02-01 NOTE — Assessment & Plan Note (Signed)
Reviewed health habits including diet and exercise and skin cancer prevention Reviewed appropriate screening tests for age  Also reviewed health mt list, fam hx and immunization status , as well as social and family history   See HPI Labs ordered Colonoscopy and mammogram utd  Flu vaccine given  Need to get shingrix imm dates dexa ordered, no falls or fractures taking vit D and good exercise Given materials to work on [employer] directives  No cognitive concerns Plans to pursue further hearing eval Eye/vision exam utd PHQ score 0 No problems doing ADLs  Sees dermatology for skin screening

## 2021-02-01 NOTE — Assessment & Plan Note (Signed)
bp in fair control at this time  BP Readings from Last 1 Encounters:  01/31/21 134/70   No changes needed Most recent labs reviewed  Disc lifstyle change with low sodium diet and exercise  Plan to continue losartan 25 mg daily

## 2021-02-01 NOTE — Assessment & Plan Note (Signed)
TSH today  Taking levothyroxine 125 mcg daily  No clinical changes

## 2021-02-01 NOTE — Assessment & Plan Note (Signed)
dexa 10/19  New one ordered No falls or fractures Taking vit D daily  Good exercise   Disc need for calcium/ vitamin D/ wt bearing exercise and bone density test every 2 y to monitor Disc safety/ fracture risk in detail

## 2021-02-01 NOTE — Assessment & Plan Note (Signed)
Reviewed screen  Family has noticed Pt plans to return to [place] where recent eval was to consider hearing aides

## 2021-02-01 NOTE — Assessment & Plan Note (Signed)
A1C ordered Has been well controlled with metformin 1000 mg bid and diet  Taking arb and statin  Good foot care  Eye exam due in november

## 2021-02-07 ENCOUNTER — Ambulatory Visit
Admission: RE | Admit: 2021-02-07 | Discharge: 2021-02-07 | Disposition: A | Discharge status: Home / Self Care | Payer: PPO | Source: Ambulatory Visit | Attending: Family Medicine | Admitting: Family Medicine

## 2021-02-07 ENCOUNTER — Other Ambulatory Visit: Payer: Self-pay

## 2021-02-07 DIAGNOSIS — M8589 Other specified disorders of bone density and structure, multiple sites: Secondary | ICD-10-CM | POA: Diagnosis not present

## 2021-02-07 DIAGNOSIS — Z780 Asymptomatic menopausal state: Secondary | ICD-10-CM | POA: Diagnosis not present

## 2021-02-07 DIAGNOSIS — E2839 Other primary ovarian failure: Secondary | ICD-10-CM

## 2021-02-13 ENCOUNTER — Encounter: Payer: Self-pay | Admitting: *Deleted

## 2021-03-06 ENCOUNTER — Other Ambulatory Visit: Payer: Self-pay | Admitting: Family Medicine

## 2021-03-26 DIAGNOSIS — H25813 Combined forms of age-related cataract, bilateral: Secondary | ICD-10-CM | POA: Diagnosis not present

## 2021-03-26 DIAGNOSIS — H18593 Other hereditary corneal dystrophies, bilateral: Secondary | ICD-10-CM | POA: Diagnosis not present

## 2021-03-26 DIAGNOSIS — H00024 Hordeolum internum left upper eyelid: Secondary | ICD-10-CM | POA: Diagnosis not present

## 2021-03-26 DIAGNOSIS — H43813 Vitreous degeneration, bilateral: Secondary | ICD-10-CM | POA: Diagnosis not present

## 2021-03-26 DIAGNOSIS — E119 Type 2 diabetes mellitus without complications: Secondary | ICD-10-CM | POA: Diagnosis not present

## 2021-03-26 DIAGNOSIS — H179 Unspecified corneal scar and opacity: Secondary | ICD-10-CM | POA: Diagnosis not present

## 2021-04-04 ENCOUNTER — Telehealth: Payer: Self-pay

## 2021-04-04 NOTE — Chronic Care Management (AMB) (Addendum)
    Chronic Care Management Pharmacy Assistant   Name: Carla Mccoy  MRN: [id number] DOB: 1950/12/24   Reason for Encounter: Adherence Review  Recent office visits:  01/31/21-PCP-Patient presented for AWV. Discussed annual screenings, vaccines, flu shot given.  Dexa scan ordered, reviewed previous labs, Labs ordered( stable and in normal range)  Recent consult visits:  None since last CCM contact  Hospital visits:  None in previous 6 months  Medications: Outpatient Encounter Medications as of 04/04/2021  Medication Sig   atorvastatin (LIPITOR) 10 MG tablet Take 1/2 (one-half) tablet by mouth once daily   Cholecalciferol (VITAMIN D3) 2000 UNITS TABS Take by mouth.   fenofibrate 54 MG tablet Take 1 tablet (54 mg total) by mouth daily.   levothyroxine (SYNTHROID) 125 MCG tablet Take 1 tablet (125 mcg total) by mouth daily before breakfast.   losartan (COZAAR) 25 MG tablet Take 1 tablet (25 mg total) by mouth daily.   Magnesium 400 MG CAPS Take 1 tablet by mouth 3 (three) times a week.   metFORMIN (GLUCOPHAGE) 1000 MG tablet Take 1 tablet (1,000 mg total) by mouth 2 (two) times daily with a meal.   Multiple Vitamins-Minerals (MULTIVITAMIN,TX-MINERALS) tablet Take 1 tablet by mouth 2 (two) times a week.   Omega-3 Fatty Acids (OMEGA 3 PO) Take 1 capsule by mouth 2 (two) times a week.    vitamin C (ASCORBIC ACID) 500 MG tablet Take 500 mg by mouth daily.   Vitamin E 400 units TABS Take 800 Units by mouth daily.   zinc gluconate 50 MG tablet Take 50 mg by mouth 2 (two) times a week.   No facility-administered encounter medications on file as of 04/04/2021.     Attempted contact with Lesle Chris Shutes multiple times on 04/09/21,04/18/21,04/22/21. Unsuccessful outreach.   Patient is > 5 days past due for refill on the following medications per chart history:  [facility] Medications: Medication Name/mg Last Fill Days Supply Losartan 25mg   03/09/21 90 Atorvastatin 10mg   01/05/21  90 Metformin  1000mg   01/14/21 90   Care Gaps: Annual wellness visit in last year? Yes Most Recent BP reading: 134/70  74-P  If Diabetic: Most recent A1C reading: 6.2 01/31/21 Last eye exam / retinopathy screening: 2021 Last diabetic foot exam: 01/31/21  No appointments scheduled within the next 30 days.  Debbora Dus, CPP notified  Avel Sensor, [place] Assistant [phone]

## 2021-04-18 ENCOUNTER — Other Ambulatory Visit: Payer: Self-pay | Admitting: Family Medicine

## 2021-04-20 DIAGNOSIS — M791 Myalgia, unspecified site: Secondary | ICD-10-CM | POA: Diagnosis not present

## 2021-04-20 DIAGNOSIS — J329 Chronic sinusitis, unspecified: Secondary | ICD-10-CM | POA: Diagnosis not present

## 2021-04-20 DIAGNOSIS — Z20828 Contact with and (suspected) exposure to other viral communicable diseases: Secondary | ICD-10-CM | POA: Diagnosis not present

## 2021-04-22 ENCOUNTER — Telehealth: Payer: Self-pay

## 2021-04-22 NOTE — Telephone Encounter (Signed)
I spoke with pt; pt went to UC in [place] on 04/20/21. Pt was + for covid home test 04/20/21; pts symptoms first started on 04/20/21 with dry cough; pt was given paxlovid at [facility] on 04/20/21. Pt said she thinks the doctor at [facility] told pt that she should not take one of her meds with paxlovid. Pt cannot remember which med she was supposed to stop taking. Pt started paxlovid on 04/20/21 at 4 PM. Pt also stopped taking fenofibrate and atorvastatin on 04/20/21. Pt wants to know if she should stop any of her regular meds on med list.pt said today she feels little bit better. Pt still has dry cough, no taste, 99.9 temp this morning and pt took tylenol for fever. Pt has H/A at pain level 7 this morining. Pt has had runny nose and scratchy throat. No SOB and pt cannot tell she has any CP. Pt has had some back pain but pt wonders if could be from laying so much.Self quarantine, drink plenty of fluids, rest, and take Tylenol for fever. UC & ED precautions given and pt voiced understanding. Pt request cb about any of her regular med she should not take with paxlovid. Sending note to Dr Glori Bickers and [place] CMA and will teams [place].

## 2021-04-22 NOTE — Telephone Encounter (Signed)
[place] Night - Client TELEPHONE ADVICE RECORD AccessNurse Patient Name: Carla Mccoy Gender: Female DOB: 1950/07/20 Age: 70 Y 2 M 25 D Return Phone Number: [phone] (Primary), [phone] (Secondary) Address: [place] [address] Client [place] Night - Client Client Site [place] Provider Glori Bickers, Roque Lias - MD Contact Type Call Who Is Calling Patient / Member / Family / Caregiver Call Type Triage / Clinical Relationship To Patient Self Return Phone Number [phone] (Primary) Chief Complaint Cough Reason for Call Symptomatic / Request for [place] states, tested positive for Covid. Needs meds called in. Has a cough, chills, no taste. Translation No Nurse Assessment Nurse: Maple Mirza, RN, Celeste Date/Time (Eastern Time): 04/20/2021 9:36:24 AM Confirm and document reason for call. If symptomatic, describe symptoms. ---Caller states she tested positive for covid today, Has a cough, chills, no taste that started 3-4 days ago. Exposed to [place] on Tuesday. 101.9 temp last night, 99.7 temp today. Does the patient have any new or worsening symptoms? ---Yes Will a triage be completed? ---Yes Related visit to physician within the last 2 weeks? ---No Does the PT have any chronic conditions? (i.e. diabetes, asthma, this includes High risk factors for pregnancy, etc.) ---No Is this a behavioral health or substance abuse call? ---No Guidelines Guideline Title Affirmed Question Affirmed Notes Nurse Date/Time ([place] Time) COVID-19 - Diagnosed or Suspected [1] HIGH RISK for severe COVID complications (e.g., weak immune system, age > 53 years, obesity with BMI 30 or higher, pregnant, chronic lung disease or other chronic medical condition) AND [2] COVID symptoms Maple Mirza, RN, Anderson Malta 04/20/2021 9:38:28 AM PLEASE NOTE: All timestamps  contained within this report are represented as [place] Standard Time. CONFIDENTIALTY NOTICE: This fax transmission is intended only for the addressee. It contains information that is legally privileged, confidential or otherwise protected from use or disclosure. If you are not the intended recipient, you are strictly prohibited from reviewing, disclosing, copying using or disseminating any of this information or taking any action in reliance on or regarding this information. If you have received this fax in error, please notify us immediately by telephone so that we can arrange for its return to [name]. Phone: [phone], Toll-Free: [phone], Fax: [phone] Page: 2 of 3 Call Id: [id number] Guidelines Guideline Title Affirmed Question Affirmed Notes Nurse Date/Time Eilene Ghazi Time) (e.g., cough, fever) (Exceptions: Already seen by PCP and no new or worsening symptoms.) Disp. Time Eilene Ghazi Time) Disposition Final User 04/20/2021 9:49:41 AM Paged On Call back to [facility], [facility], [facility] 04/20/2021 10:04:36 AM Paged On Call back to [facility], [place], [facility] 04/20/2021 10:34:35 AM Paged On Call back to [facility], [place], [facility] 04/20/2021 9:48:06 AM Call PCP within 24 Hours Yes Pitre, RN, Anderson Malta Caller Disagree/Comply Comply Caller Understands Yes PreDisposition [place] Advice Given Per Guideline * You need to discuss this with your doctor (or NP/PA) within the next 24 hours. * IF OFFICE WILL BE CLOSED: I'll page the oncall provider now. EXCEPTION: from 9 pm to 9 am. Since this isn't urgent, we'll hold the page until morning. FEVER MEDICINES: * For fevers above 101 F (38.3 C) take either acetaminophen or ibuprofen. CALL BACK IF: * You become worse CARE ADVICE given per COVID-19 - DIAGNOSED OR SUSPECTED (Adult) guideline. Comments User: Dorian Heckle, RN Date/Time Eilene Ghazi Time): 04/20/2021 9:38:23 AM diabetic User: Dorian Heckle, RN Date/Time Eilene Ghazi Time):  04/20/2021 11:14:30 AM caller v/u  of recommendation to be evaluated in uc/er based on stx and unable to get a response from MD [facility] Phone DateTime Result/ Outcome Message Type Notes Billey Chang- MD [phone] 04/20/2021 9:49:41 AM Called On Call Provider - Left Message Doctor Paged Billey Chang- MD [phone] 04/20/2021 10:04:36 AM Called On Call Provider - Left Message Doctor Paged PLEASE NOTE: All timestamps contained within this report are represented as [place] Standard Time. CONFIDENTIALTY NOTICE: This fax transmission is intended only for the addressee. It contains information that is legally privileged, confidential or otherwise protected from use or disclosure. If you are not the intended recipient, you are strictly prohibited from reviewing, disclosing, copying using or disseminating any of this information or taking any action in reliance on or regarding this information. If you have received this fax in error, please notify us immediately by telephone so that we can arrange for its return to [name]. Phone: [phone], Toll-Free: [phone], Fax: [phone] Page: 3 of 3 Call Id: [id number] Paging DoctorName Phone DateTime Result/ Outcome Message Type Notes Billey Chang- MD [phone] 04/20/2021 10:34:35 AM Paged On Call Back to Call Center Doctor Paged Billey Chang- MD 04/20/2021 11:05:45 AM Unable to Reach on call - Max Attempts Message Result RN will upgrade triage outcome with no callback from MD x3 attempt

## 2021-04-22 NOTE — Telephone Encounter (Signed)
Pt notified of Dr. [name]'s instructions and verbalized understanding  

## 2021-04-22 NOTE — Telephone Encounter (Signed)
Keep holding those and also hold the losartan until she is done with the paxlovid  Glad she is improving , keep [name] posted if anything changes

## 2021-05-13 ENCOUNTER — Other Ambulatory Visit: Payer: Self-pay | Admitting: Family Medicine

## 2021-06-10 DIAGNOSIS — E119 Type 2 diabetes mellitus without complications: Secondary | ICD-10-CM | POA: Diagnosis not present

## 2021-06-10 DIAGNOSIS — H25813 Combined forms of age-related cataract, bilateral: Secondary | ICD-10-CM | POA: Diagnosis not present

## 2021-06-10 DIAGNOSIS — H00024 Hordeolum internum left upper eyelid: Secondary | ICD-10-CM | POA: Diagnosis not present

## 2021-06-10 DIAGNOSIS — H179 Unspecified corneal scar and opacity: Secondary | ICD-10-CM | POA: Diagnosis not present

## 2021-06-10 DIAGNOSIS — H18593 Other hereditary corneal dystrophies, bilateral: Secondary | ICD-10-CM | POA: Diagnosis not present

## 2021-06-10 DIAGNOSIS — H43813 Vitreous degeneration, bilateral: Secondary | ICD-10-CM | POA: Diagnosis not present

## 2021-07-03 ENCOUNTER — Telehealth: Payer: Self-pay

## 2021-07-03 NOTE — Chronic Care Management (AMB) (Signed)
? ? ?Chronic Care Management ?Pharmacy Assistant  ? ?Name: Carla Mccoy  MRN: [id number] DOB: [birth year] ? ? ? ?Reason for Encounter: Hypertension Disease State ?  ? ?Recent office visits:  ?01/31/21-PCP-Marne [name],MD-Patient presented for AWV-Labs ordered( TSH in normal range-no changes needed ?Cholesterol is stable ,A1C is well controlled-Keep working on [employer] and exercise  ? ?Recent consult visits:  ?04/20/21-Family Medicine- Milly Jakob- no data found ?03/26/21-Ophthalmology-Richard Groat- no data found ? ?Hospital visits:  ?None in previous 6 months ? ?Medications: ?Outpatient Encounter Medications as of 07/03/2021  ?Medication Sig  ? atorvastatin (LIPITOR) 10 MG tablet Take 1/2 (one-half) tablet by mouth once daily  ? Cholecalciferol (VITAMIN D3) 2000 UNITS TABS Take by mouth.  ? fenofibrate 54 MG tablet Take 1 tablet (54 mg total) by mouth daily.  ? levothyroxine (SYNTHROID) 125 MCG tablet Take 1 tablet (125 mcg total) by mouth daily before breakfast.  ? losartan (COZAAR) 25 MG tablet Take 1 tablet (25 mg total) by mouth daily.  ? Magnesium 400 MG CAPS Take 1 tablet by mouth 3 (three) times a week.  ? metFORMIN (GLUCOPHAGE) 1000 MG tablet TAKE 1 TABLET BY MOUTH TWICE DAILY WITH A MEAL  ? Multiple Vitamins-Minerals (MULTIVITAMIN,TX-MINERALS) tablet Take 1 tablet by mouth 2 (two) times a week.  ? Omega-3 Fatty Acids (OMEGA 3 PO) Take 1 capsule by mouth 2 (two) times a week.   ? vitamin C (ASCORBIC ACID) 500 MG tablet Take 500 mg by mouth daily.  ? Vitamin E 400 units TABS Take 800 Units by mouth daily.  ? zinc gluconate 50 MG tablet Take 50 mg by mouth 2 (two) times a week.  ? ?No facility-administered encounter medications on file as of 07/03/2021.  ? ? ?Recent Office Vitals: ?BP Readings from Last 3 Encounters:  ?01/31/21 134/70  ?11/06/20 116/66  ?08/07/20 135/70  ? ?Pulse Readings from Last 3 Encounters:  ?01/31/21 74  ?11/06/20 66  ?08/07/20 72  ?  ?Wt Readings from Last 3 Encounters:  ?01/31/21 167  lb 4 oz (75.9 kg)  ?11/06/20 170 lb (77.1 kg)  ?08/07/20 169 lb 4 oz (76.8 kg)  ?  ? ?Kidney Function ?Lab Results  ?Component Value Date/Time  ? CREATININE 0.87 01/31/2021 09:08 AM  ? CREATININE 0.95 10/30/2020 07:39 AM  ? CREATININE 1.0 08/05/2012 08:24 AM  ? CREATININE 1.0 02/05/2012 10:23 AM  ? GFR 66.75 01/31/2021 09:08 AM  ? GFRNONAA >60 05/31/2009 01:00 PM  ? GFRAA  05/31/2009 01:00 PM  ?  >60        ?The eGFR has been calculated ?using the MDRD equation. ?This calculation has not been ?validated in all clinical ?situations. ?eGFR's persistently ?<60 mL/min signify ?possible Chronic Kidney Disease.  ? ? ?BMP Latest Ref Rng & Units 01/31/2021 10/30/2020 04/12/2020  ?Glucose 70 - 99 mg/dL 841(L) 244(W) 102(V)  ?BUN 6 - 23 mg/dL 14 14 15   ?Creatinine 0.40 - 1.20 mg/dL 2.53 6.64 4.03  ?Sodium 135 - 145 mEq/L 140 140 139  ?Potassium 3.5 - 5.1 mEq/L 4.7 4.2 4.5  ?Chloride 96 - 112 mEq/L 105 106 104  ?CO2 19 - 32 mEq/L 27 27 28   ?Calcium 8.4 - 10.5 mg/dL 9.8 9.6 9.6  ? ? ?Attempted contact with Tomi Bamberger Dolder 3 times on 07/03/21,07/09/21,07/12/21. Unsuccessful outreach. Will attempt contact next month.  ? ?Current antihypertensive regimen:  ?Losartan 25 mg - 1 tablet daily after breakfast ? ? ?What recent interventions/DTPs have been made by any provider to improve Blood Pressure  control since last CPP Visit: Counseled to monitor BP at home at least once monthly, document, and provide log at future appointments ? ?Any recent hospitalizations or ED visits since last visit with CPP? No ? ?Adherence Review: ?Is the patient currently on ACE/ARB medication? Yes ?Does the patient have >5 day gap between last estimated fill dates? CPP to review  ? ? ?Star Rating Drugs:  ?Medication:  Last Fill: Day Supply ?Metformin 1000mg  06/10/21  - ?Atorvastatin 10mg  06/10/21  - ?Losartan 25mg  06/10/21  - ? ? ?Care Gaps: ?Annual wellness visit in last year? Yes ?Most Recent BP reading:134/70  74-P  01/31/21 ? ?If Diabetic: ?Most recent A1C  reading:6.2 ?Last eye exam / retinopathy screening:2021 ?Last diabetic foot exam:UTD ? ? ?No appointments scheduled within the next 30 days. ? ? ?Al Corpus, [facility]  CPP notified ? ?Brenlynn Fake, CCMA ?Clincal Pharmacy Assistant ?[phone] ? ?

## 2021-07-22 ENCOUNTER — Other Ambulatory Visit: Payer: Self-pay | Admitting: Family Medicine

## 2021-07-22 DIAGNOSIS — H00024 Hordeolum internum left upper eyelid: Secondary | ICD-10-CM | POA: Diagnosis not present

## 2021-07-22 DIAGNOSIS — Z1231 Encounter for screening mammogram for malignant neoplasm of breast: Secondary | ICD-10-CM

## 2021-07-22 DIAGNOSIS — H18593 Other hereditary corneal dystrophies, bilateral: Secondary | ICD-10-CM | POA: Diagnosis not present

## 2021-07-22 DIAGNOSIS — H5213 Myopia, bilateral: Secondary | ICD-10-CM | POA: Diagnosis not present

## 2021-07-22 LAB — HM DIABETES EYE EXAM

## 2021-07-29 DIAGNOSIS — H903 Sensorineural hearing loss, bilateral: Secondary | ICD-10-CM | POA: Diagnosis not present

## 2021-08-05 DIAGNOSIS — H903 Sensorineural hearing loss, bilateral: Secondary | ICD-10-CM | POA: Diagnosis not present

## 2021-08-06 ENCOUNTER — Telehealth: Payer: Self-pay

## 2021-08-06 NOTE — Chronic Care Management (AMB) (Signed)
? ? ?Chronic Care Management ?Pharmacy Assistant  ? ?Name: Carla Mccoy  MRN: [id number] DOB: January 18, 1951 ? ? ?Reason for Encounter: Hypertension Disease State ?  ? ?Recent office visits:  ?None since last CCM contact ? ?Recent consult visits:  ?None since last CCM contact ? ?Hospital visits:  ?None in previous 6 months ? ?Medications: ?Outpatient Encounter Medications as of 08/06/2021  ?Medication Sig  ? atorvastatin (LIPITOR) 10 MG tablet Take 1/2 (one-half) tablet by mouth once daily  ? Cholecalciferol (VITAMIN D3) 2000 UNITS TABS Take by mouth.  ? fenofibrate 54 MG tablet Take 1 tablet (54 mg total) by mouth daily.  ? levothyroxine (SYNTHROID) 125 MCG tablet Take 1 tablet (125 mcg total) by mouth daily before breakfast.  ? losartan (COZAAR) 25 MG tablet Take 1 tablet (25 mg total) by mouth daily.  ? Magnesium 400 MG CAPS Take 1 tablet by mouth 3 (three) times a week.  ? metFORMIN (GLUCOPHAGE) 1000 MG tablet TAKE 1 TABLET BY MOUTH TWICE DAILY WITH A MEAL  ? Multiple Vitamins-Minerals (MULTIVITAMIN,TX-MINERALS) tablet Take 1 tablet by mouth 2 (two) times a week.  ? Omega-3 Fatty Acids (OMEGA 3 PO) Take 1 capsule by mouth 2 (two) times a week.   ? vitamin C (ASCORBIC ACID) 500 MG tablet Take 500 mg by mouth daily.  ? Vitamin E 400 units TABS Take 800 Units by mouth daily.  ? zinc gluconate 50 MG tablet Take 50 mg by mouth 2 (two) times a week.  ? ?No facility-administered encounter medications on file as of 08/06/2021.  ? ? ?Recent Office Vitals: ?BP Readings from Last 3 Encounters:  ?01/31/21 134/70  ?11/06/20 116/66  ?08/07/20 135/70  ? ?Pulse Readings from Last 3 Encounters:  ?01/31/21 74  ?11/06/20 66  ?08/07/20 72  ?  ?Wt Readings from Last 3 Encounters:  ?01/31/21 167 lb 4 oz (75.9 kg)  ?11/06/20 170 lb (77.1 kg)  ?08/07/20 169 lb 4 oz (76.8 kg)  ?  ? ?Kidney Function ?Lab Results  ?Component Value Date/Time  ? CREATININE 0.87 01/31/2021 09:08 AM  ? CREATININE 0.95 10/30/2020 07:39 AM  ? CREATININE 1.0  08/05/2012 08:24 AM  ? CREATININE 1.0 02/05/2012 10:23 AM  ? GFR 66.75 01/31/2021 09:08 AM  ? GFRNONAA >60 05/31/2009 01:00 PM  ? GFRAA  05/31/2009 01:00 PM  ?  >60        ?The eGFR has been calculated ?using the MDRD equation. ?This calculation has not been ?validated in all clinical ?situations. ?eGFR's persistently ?<60 mL/min signify ?possible Chronic Kidney Disease.  ? ? ? ?  Latest Ref Rng & Units 01/31/2021  ?  9:08 AM 10/30/2020  ?  7:39 AM 04/12/2020  ?  8:15 AM  ?BMP  ?Glucose 70 - 99 mg/dL 253   664   403    ?BUN 6 - 23 mg/dL 14   14   15     ?Creatinine 0.40 - 1.20 mg/dL 4.74   2.59   5.63    ?Sodium 135 - 145 mEq/L 140   140   139    ?Potassium 3.5 - 5.1 mEq/L 4.7   4.2   4.5    ?Chloride 96 - 112 mEq/L 105   106   104    ?CO2 19 - 32 mEq/L 27   27   28     ?Calcium 8.4 - 10.5 mg/dL 9.8   9.6   9.6    ? ? ? ?Attempted contact with Carla Mccoy 3 times  on 08/06/21,08/12/21,08/13/21. Unsuccessful outreach. Will attempt contact next month.  ? ?Current antihypertensive regimen:  ? Losartan 25 mg - 1 tablet daily after breakfast ? ? ?Adherence Review: ?Is the patient currently on ACE/ARB medication? Yes ?Does the patient have >5 day gap between last estimated fill dates? CPP to review ? ? ?Star Rating Drugs:  ?Medication:  Last Fill: Day Supply ?Metformin 1000mg  06/10/21  - ?Atorvastatin 10mg  06/10/21  - ?Losartan 25mg  06/10/21  - ? ? ? ?Care Gaps: ?Annual wellness visit in last year? Yes ?Most Recent BP reading:134/70  74-P  01/31/21 ? ? ?Upcoming appointments: 08/19/21  mammogram  ? ? ? ?Al Corpus, CPP notified ? ?Gordon Carlson, CCMA ?Health concierge  ?[phone]  ?

## 2021-08-19 ENCOUNTER — Ambulatory Visit
Admission: RE | Admit: 2021-08-19 | Discharge: 2021-08-19 | Disposition: A | Discharge status: Home / Self Care | Payer: PPO | Source: Ambulatory Visit | Attending: Family Medicine | Admitting: Family Medicine

## 2021-08-19 DIAGNOSIS — Z1231 Encounter for screening mammogram for malignant neoplasm of breast: Secondary | ICD-10-CM

## 2021-09-02 ENCOUNTER — Telehealth: Payer: Self-pay

## 2021-09-02 NOTE — Chronic Care Management (AMB) (Signed)
Chronic Care Management Pharmacy Assistant   Name: Carla Mccoy  MRN: [id number] DOB: 03-22-1951    Reason for Encounter:  Hypertension Disease State  Recent office visits:  None since last CCM contact   Recent consult visits:  None since last CCM contact   Hospital visits:  None in previous 6 months  Medications: Outpatient Encounter Medications as of 09/02/2021  Medication Sig   atorvastatin (LIPITOR) 10 MG tablet Take 1/2 (one-half) tablet by mouth once daily   Cholecalciferol (VITAMIN D3) 2000 UNITS TABS Take by mouth.   fenofibrate 54 MG tablet Take 1 tablet (54 mg total) by mouth daily.   levothyroxine (SYNTHROID) 125 MCG tablet Take 1 tablet (125 mcg total) by mouth daily before breakfast.   losartan (COZAAR) 25 MG tablet Take 1 tablet (25 mg total) by mouth daily.   Magnesium 400 MG CAPS Take 1 tablet by mouth 3 (three) times a week.   metFORMIN (GLUCOPHAGE) 1000 MG tablet TAKE 1 TABLET BY MOUTH TWICE DAILY WITH A MEAL   Multiple Vitamins-Minerals (MULTIVITAMIN,TX-MINERALS) tablet Take 1 tablet by mouth 2 (two) times a week.   Omega-3 Fatty Acids (OMEGA 3 PO) Take 1 capsule by mouth 2 (two) times a week.    vitamin C (ASCORBIC ACID) 500 MG tablet Take 500 mg by mouth daily.   Vitamin E 400 units TABS Take 800 Units by mouth daily.   zinc gluconate 50 MG tablet Take 50 mg by mouth 2 (two) times a week.   No facility-administered encounter medications on file as of 09/02/2021.     Recent Office Vitals: BP Readings from Last 3 Encounters:  01/31/21 134/70  11/06/20 116/66  08/07/20 135/70   Pulse Readings from Last 3 Encounters:  01/31/21 74  11/06/20 66  08/07/20 72    Wt Readings from Last 3 Encounters:  01/31/21 167 lb 4 oz (75.9 kg)  11/06/20 170 lb (77.1 kg)  08/07/20 169 lb 4 oz (76.8 kg)     Kidney Function Lab Results  Component Value Date/Time   CREATININE 0.87 01/31/2021 09:08 AM   CREATININE 0.95 10/30/2020 07:39 AM   CREATININE 1.0  08/05/2012 08:24 AM   CREATININE 1.0 02/05/2012 10:23 AM   GFR 66.75 01/31/2021 09:08 AM   GFRNONAA >60 05/31/2009 01:00 PM   GFRAA  05/31/2009 01:00 PM    >60        The eGFR has been calculated using the MDRD equation. This calculation has not been validated in all clinical situations. eGFR's persistently <60 mL/min signify possible Chronic Kidney Disease.       Latest Ref Rng & Units 01/31/2021    9:08 AM 10/30/2020    7:39 AM 04/12/2020    8:15 AM  BMP  Glucose 70 - 99 mg/dL 122   118   134    BUN 6 - 23 mg/dL 14   14   15     Creatinine 0.40 - 1.20 mg/dL 0.87   0.95   1.03    Sodium 135 - 145 mEq/L 140   140   139    Potassium 3.5 - 5.1 mEq/L 4.7   4.2   4.5    Chloride 96 - 112 mEq/L 105   106   104    CO2 19 - 32 mEq/L 27   27   28     Calcium 8.4 - 10.5 mg/dL 9.8   9.6   9.6       Attempted contact with Hassan Rowan L  Violett 3 times on 09/02/21,09/10/21,09/24/21. Unsuccessful outreach. Will attempt contact next month.   Current antihypertensive regimen:   Losartan 25 mg - 1 tablet daily after breakfast  Any recent hospitalizations or ED visits since last visit with CPP? No   Adherence Review: Is the patient currently on ACE/ARB medication? Yes Does the patient have >5 day gap between last estimated fill dates? Yes   Star Rating Drugs:  Medication:  Last Fill: Day Supply Metformin 1088m      06/10/21              - Atorvastatin 184m      06/10/21              - Losartan 2586m          06/10/21     -   Care Gaps: Annual wellness visit in last year? Yes  Upcoming appointments: CCM appointment on 12/18/21   LinCharlene BrookePP notified  VelAvel [phone]

## 2021-10-03 ENCOUNTER — Encounter: Payer: Self-pay | Admitting: Family Medicine

## 2021-10-03 ENCOUNTER — Telehealth: Payer: Self-pay

## 2021-10-03 NOTE — Chronic Care Management (AMB) (Signed)
Chronic Care Management Pharmacy Assistant   Name: Carla Mccoy  MRN: [id number] DOB: 17-Nov-1950  Attempted contact with Lesle Chris Loughmiller 3 times on 10/04/21,10/10/21,10/24/21. Unsuccessful outreach.  Reason for Encounter:Hypertension  Disease State  Recent office visits:  None since last CCM contact   Recent consult visits:  None since last CCM contact   Hospital visits:  None in previous 6 months  Medications: Outpatient Encounter Medications as of 10/03/2021  Medication Sig   atorvastatin (LIPITOR) 10 MG tablet Take 1/2 (one-half) tablet by mouth once daily   Cholecalciferol (VITAMIN D3) 2000 UNITS TABS Take by mouth.   fenofibrate 54 MG tablet Take 1 tablet (54 mg total) by mouth daily.   levothyroxine (SYNTHROID) 125 MCG tablet Take 1 tablet (125 mcg total) by mouth daily before breakfast.   losartan (COZAAR) 25 MG tablet Take 1 tablet (25 mg total) by mouth daily.   Magnesium 400 MG CAPS Take 1 tablet by mouth 3 (three) times a week.   metFORMIN (GLUCOPHAGE) 1000 MG tablet TAKE 1 TABLET BY MOUTH TWICE DAILY WITH A MEAL   Multiple Vitamins-Minerals (MULTIVITAMIN,TX-MINERALS) tablet Take 1 tablet by mouth 2 (two) times a week.   Omega-3 Fatty Acids (OMEGA 3 PO) Take 1 capsule by mouth 2 (two) times a week.    vitamin C (ASCORBIC ACID) 500 MG tablet Take 500 mg by mouth daily.   Vitamin E 400 units TABS Take 800 Units by mouth daily.   zinc gluconate 50 MG tablet Take 50 mg by mouth 2 (two) times a week.   No facility-administered encounter medications on file as of 10/03/2021.    Recent Office Vitals: BP Readings from Last 3 Encounters:  01/31/21 134/70  11/06/20 116/66  08/07/20 135/70   Pulse Readings from Last 3 Encounters:  01/31/21 74  11/06/20 66  08/07/20 72    Wt Readings from Last 3 Encounters:  01/31/21 167 lb 4 oz (75.9 kg)  11/06/20 170 lb (77.1 kg)  08/07/20 169 lb 4 oz (76.8 kg)     Kidney Function Lab Results  Component Value Date/Time    CREATININE 0.87 01/31/2021 09:08 AM   CREATININE 0.95 10/30/2020 07:39 AM   CREATININE 1.0 08/05/2012 08:24 AM   CREATININE 1.0 02/05/2012 10:23 AM   GFR 66.75 01/31/2021 09:08 AM   GFRNONAA >60 05/31/2009 01:00 PM   GFRAA  05/31/2009 01:00 PM    >60        The eGFR has been calculated using the MDRD equation. This calculation has not been validated in all clinical situations. eGFR's persistently <60 mL/min signify possible Chronic Kidney Disease.       Latest Ref Rng & Units 01/31/2021    9:08 AM 10/30/2020    7:39 AM 04/12/2020    8:15 AM  BMP  Glucose 70 - 99 mg/dL 122   118   134    BUN 6 - 23 mg/dL 14   14   15     Creatinine 0.40 - 1.20 mg/dL 0.87   0.95   1.03    Sodium 135 - 145 mEq/L 140   140   139    Potassium 3.5 - 5.1 mEq/L 4.7   4.2   4.5    Chloride 96 - 112 mEq/L 105   106   104    CO2 19 - 32 mEq/L 27   27   28     Calcium 8.4 - 10.5 mg/dL 9.8   9.6   9.6  Adherence Review: Is the patient currently on ACE/ARB medication? Yes Does the patient have >5 day gap between last estimated fill dates? No   Star Rating Drugs:  Medication:  Last Fill: Day Supply Metformin 103m 08/09/21  90 Atorvastatin 170m3/13/23 90 Losartan 257m/2/23  90   Upcoming appointments: CCM appointment on 12/18/21   LinCharlene BrookePP notified  VelAvel SensorCMBossier [phone]

## 2021-10-28 ENCOUNTER — Other Ambulatory Visit: Payer: Self-pay | Admitting: Family Medicine

## 2021-11-29 ENCOUNTER — Telehealth: Payer: Self-pay

## 2021-11-29 NOTE — Progress Notes (Addendum)
Chronic Care Management Pharmacy Assistant   Name: Carla Mccoy  MRN: [id number] DOB: [birth year]  Reason for Encounter: CCM (Hypertension Disease State)  Recent office visits:  None since last CCM contact  Recent consult visits:  None since last CCM contact  Hospital visits:  None in previous 6 months  Medications: Outpatient Encounter Medications as of 11/29/2021  Medication Sig   atorvastatin (LIPITOR) 10 MG tablet Take 1/2 (one-half) tablet by mouth once daily   Cholecalciferol (VITAMIN D3) 2000 UNITS TABS Take by mouth.   fenofibrate 54 MG tablet Take 1 tablet (54 mg total) by mouth daily.   losartan (COZAAR) 25 MG tablet Take 1 tablet (25 mg total) by mouth daily.   Magnesium 400 MG CAPS Take 1 tablet by mouth 3 (three) times a week.   metFORMIN (GLUCOPHAGE) 1000 MG tablet TAKE 1 TABLET BY MOUTH TWICE DAILY WITH A MEAL   Multiple Vitamins-Minerals (MULTIVITAMIN,TX-MINERALS) tablet Take 1 tablet by mouth 2 (two) times a week.   Omega-3 Fatty Acids (OMEGA 3 PO) Take 1 capsule by mouth 2 (two) times a week.    vitamin C (ASCORBIC ACID) 500 MG tablet Take 500 mg by mouth daily.   Vitamin E 400 units TABS Take 800 Units by mouth daily.   zinc gluconate 50 MG tablet Take 50 mg by mouth 2 (two) times a week.   [DISCONTINUED] levothyroxine (SYNTHROID) 125 MCG tablet Take 1 tablet (125 mcg total) by mouth daily before breakfast.   No facility-administered encounter medications on file as of 11/29/2021.   Contacted patient on 11/29/2021 to discuss hypertension disease state  Current antihypertensive regimen:              Losartan 25 mg - 1 tablet daily after breakfast  Patient verbally confirms she is taking the above medications as directed. Yes  How often are you checking your Blood Pressure? infrequently  I have asked patient to take their blood pressure daily and keep a log. Advised patient I would call back on 12/06/21 for log. Patient verbalized understanding and  agreed.   Wrist or arm cuff: Arm cuff Caffeine intake: Drinks 1 cup of coffee per day Salt intake: Patient cooks with salt, but does not add any more Over the counter medications including pseudoephedrine or NSAIDs? Generic Zyrtec in spring for allergies only  Any readings above 180/120? No  What recent interventions/DTPs have been made by any provider to improve Blood Pressure control since last CPP Visit: No recent interventions  Any recent hospitalizations or ED visits since last visit with CPP? No  What diet changes have been made to improve Blood Pressure Control?  Patient has a garden and tries to eat healthy  What exercise is being done to improve your Blood Pressure Control?  Patient [place] and maintains it; walks her dogs and does [employer] workouts 3x a week  Adherence Review: Is the patient currently on ACE/ARB medication? Yes Does the patient have >5 day gap between last estimated fill dates? No  Star Rating Drugs:  Medication:  Last Fill: Day Supply Atorvastatin 10 mg 10/11/2021 90 Losartan 25 mg 09/03/21 90  Metformin 1000 mg 10/31/21 90  Care Gaps: Annual wellness visit in last year? Yes 01/31/21 Most Recent BP reading: 134/70  as of 01/31/2021  If Diabetic: Most recent A1C reading: 6.2 on 01/31/2021 Last eye exam / retinopathy screening: Up to date Last diabetic foot exam: Up to date  Upcoming appointments: CCM appointment on  12/18/21 AWV appointment on 01/29/22  Charlene Brooke, CPP notified  Marijean Niemann, [place] Clinical Pharmacy Assistant [phone]

## 2021-12-03 ENCOUNTER — Other Ambulatory Visit: Payer: Self-pay | Admitting: Family Medicine

## 2021-12-06 NOTE — Progress Notes (Signed)
Called patient for blood pressure log as previously discussed.   Date  Blood Pressure Pulse 08/04  122/63   77  08/03  ---------   ---- 08/02  132/60   70  08/01  134/63   68  07/31  128/60   78  07/30  132/65   78  07/29  140/68   80 - Pt exercised and did not wait 30 min to take BP  07/28  124/61   [place], CPP notified  Marijean Niemann, [place] Clinical Pharmacy Assistant [phone]

## 2021-12-13 ENCOUNTER — Telehealth: Payer: Self-pay

## 2021-12-13 NOTE — Chronic Care Management (AMB) (Signed)
    Chronic Care Management Pharmacy Assistant   Name: Carla Mccoy  MRN: [id number] DOB: 03-Apr-1951   Reason for Encounter: Reminder Call   Conditions to be addressed/monitored: HTN, HLD, and DMII   Recent office visits:  01/31/21-Marne [name],MD(PCP)-AWV, labs (TSH in normal range-no changes needed Cholesterol is stable )A1C 6.2 is well controlled-Keep working on healthy diet and exercise  Recent consult visits:  04/20/21-Family Medicine- Heywood Footman- no data found 03/26/21-Ophthalmology-Richard Groat- no data found    Hospital visits:  None in previous 6 months  Medications: Outpatient Encounter Medications as of 12/13/2021  Medication Sig   atorvastatin (LIPITOR) 10 MG tablet Take 1/2 (one-half) tablet by mouth once daily   Cholecalciferol (VITAMIN D3) 2000 UNITS TABS Take by mouth.   fenofibrate 54 MG tablet Take 1 tablet (54 mg total) by mouth daily.   levothyroxine (SYNTHROID) 125 MCG tablet TAKE 1 TABLET BY MOUTH ONCE DAILY BEFORE BREAKFAST   losartan (COZAAR) 25 MG tablet Take 1 tablet (25 mg total) by mouth daily.   Magnesium 400 MG CAPS Take 1 tablet by mouth 3 (three) times a week.   metFORMIN (GLUCOPHAGE) 1000 MG tablet TAKE 1 TABLET BY MOUTH TWICE DAILY WITH A MEAL   Multiple Vitamins-Minerals (MULTIVITAMIN,TX-MINERALS) tablet Take 1 tablet by mouth 2 (two) times a week.   Omega-3 Fatty Acids (OMEGA 3 PO) Take 1 capsule by mouth 2 (two) times a week.    vitamin C (ASCORBIC ACID) 500 MG tablet Take 500 mg by mouth daily.   Vitamin E 400 units TABS Take 800 Units by mouth daily.   zinc gluconate 50 MG tablet Take 50 mg by mouth 2 (two) times a week.   No facility-administered encounter medications on file as of 12/13/2021.   Carla Mccoy was contacted to remind of upcoming telephone visit with Carla Mccoy on 12/18/21 at 8:45am. Patient was reminded to have any blood glucose and blood pressure readings available for review at appointment.   Message was  left reminding patient of appointment.  CCM referral has been placed prior to visit?  No   Star Rating Drugs: Medication:  Last Fill: Day Supply Atorvastatin '10mg'$  10/11/21  90 Losartan '25mg'$  09/03/21  90 Metformin '1000mg'$  10/31/21 [place], CPP notified  Avel Sensor, [place]  [phone]

## 2021-12-18 ENCOUNTER — Ambulatory Visit: Payer: PPO | Admitting: Pharmacist

## 2021-12-18 DIAGNOSIS — E039 Hypothyroidism, unspecified: Secondary | ICD-10-CM

## 2021-12-18 DIAGNOSIS — I10 Essential (primary) hypertension: Secondary | ICD-10-CM

## 2021-12-18 DIAGNOSIS — E119 Type 2 diabetes mellitus without complications: Secondary | ICD-10-CM

## 2021-12-18 DIAGNOSIS — E1169 Type 2 diabetes mellitus with other specified complication: Secondary | ICD-10-CM

## 2021-12-18 NOTE — Patient Instructions (Addendum)
Visit Information  Phone number for Pharmacist: [phone]   Goals Addressed   None     Care Plan : [place]  Updates made by Charlton Haws, [place] since 12/18/2021 12:00 AM     Problem: Hypertension, Hyperlipidemia, Diabetes, and Hypothyroidism   Priority: High     Long-Range Goal: Disease mgmt   Start Date: 06/11/2020  Expected End Date: 12/18/2021  This Visit's Progress: On track  Recent Progress: On track  Priority: High  Note:   Current Barriers:  None identified   Pharmacist Clinical Goal(s):  Patient will contact provider office for questions/concerns as evidenced notation of same in electronic health record through collaboration with PharmD and provider.   Interventions: 1:1 collaboration with [name], [name], MD regarding development and update of comprehensive plan of care as evidenced by provider attestation and co-signature Inter-disciplinary care team collaboration (see longitudinal plan of care) Comprehensive medication review performed; medication list updated in electronic medical record  Interventions: 1:1 collaboration with [name], [name], MD regarding development and update of comprehensive plan of care as evidenced by provider attestation and co-signature Inter-disciplinary care team collaboration (see longitudinal plan of care) Comprehensive medication review performed; medication list updated in electronic medical record  Hypertension (BP goal <140/90) -Controlled - per clinic and home readings -Current home readings: 122/63, 132/60, 134/63, 128/60, 132/65, 124/61 123/61, 124/58 -Denies hypotensive/hypertensive symptoms. Denies falls. -Current treatment: Losartan 25 mg daily - Appropriate, Effective, Safe, Accessible -Medications previously tried: n/a -Counseled to monitor BP at home periodically -Recommended to continue current medication  Hyperlipidemia: (LDL goal < 70) -Controlled - LDL 59 (01/2021) at goal -Current  treatment: Atovastatin 10 mg - 1/2 tablet daily - Appropriate, Effective, Safe, Accessible Fenofibrate 54 mg daily -Appropriate, Effective, Safe, Accessible Omega 3 fatty acids - 2-3 days per week -Appropriate, Effective, Safe, Accessible -Medications previously tried: none  -Pertinent history - Reports triglycerides came down from 800s after starting fenofibrate.  -Assessed adherence - patient confirms and refill timely -Recommended to continue current medications  Diabetes (A1c goal <7%) -Controlled - A1c  6.2% (01/2021) at goal -Current home glucose readings - not checking blood glucose  -Denies hypoglycemic/hyperglycemic symptoms -Current medications: Metformin 1000 mg BID -Appropriate, Effective, Safe, Accessible -Medications previously tried: n/a -Diet: Limits starches, increased salads/vegetables from her garden -Exercise: Keeping great granddaughter, stays active watching her, mows lawn, keeps garden -She is working hard on weight loss and limiting carbs, happy with improved A1c. Her goal is to come off metformin. -Foot exam completed by PCP 10/2020, no abnormalities Recommended to continue current medication   Hypothyroidism (Goal TSH: 0.4-4.5) -Controlled - pt takes thyroid with Vitamin E and Vitamin D before breakfast, TSH is at goal this way -Current treatment: Levothyroxine 125 mcg daily - Appropriate, Effective, Safe, Accessible -Counseled to take medication as she has been -Recommended to continue current medication  Patient Goals/Self-Care Activities Patient will:  - take medications as prescribed as evidenced by patient report and record review focus on medication adherence by routine check blood pressure periodically, document, and provide at future appointments       Patient verbalizes understanding of instructions and care plan provided today and agrees to view in [place]. Active MyChart status and patient understanding of how to access instructions and care  plan via MyChart confirmed with patient.    The patient has been provided with contact information for the care management team and has been advised to call with any health related questions or concerns.  Charlene Brooke, PharmD, BCACP Clinical Pharmacist [facility] at [facility] [phone]

## 2021-12-18 NOTE — Progress Notes (Signed)
Chronic Care Management Pharmacy Note  12/18/2021 Name:  Carla Mccoy MRN:  [id number] DOB:  [birth year]  Summary: CCM F/U visit -Reviewed medications; pt affirms compliance with medications as prescribed -BP at goal at home (<130/80 on average)  Recommendations/Changes made from today's visit: -No med changes  Plan: -Transition CCM to Self Care: Patient achieved CCM goals and no longer needs to be contacted as frequently. The patient has been provided with contact information for the care management team and has been advised to call with any health related questions or concerns.  -PCP CPE 01/29/21    Subjective: Carla Mccoy is an 71 y.o. year old female who is a primary patient of [name], [name], MD.  The CCM team was consulted for assistance with disease management and care coordination needs.    Engaged with patient by telephone for follow up visit in response to provider referral for pharmacy case management and/or care coordination services.   Consent to Services:  The patient was given information about Chronic Care Management services, agreed to services, and gave verbal consent prior to initiation of services.  Please see initial visit note for detailed documentation.   Patient Care Team: [name], [name], MD as PCP - General (Family Medicine) Romine, Lubertha South, MD (Obstetrics and Gynecology) Verl Blalock, Marijo Conception, MD (Inactive) (Cardiology) Love, Alyson Locket, MD (Neurology) Sable Feil, MD (Gastroenterology) Magrinat, Virgie Dad, MD (Inactive) (Hematology and Oncology) Erroll Luna, MD (General Surgery) Sharol Roussel, [place] (Optometry) Clent Jacks, MD (Ophthalmology) Charlton Haws, [facility] as Pharmacist (Pharmacist)  Recent office visits: 01/31/21-Marne [name],MD(PCP)-AWV, labs (TSH in normal range-no changes needed Cholesterol is stable )A1C 6.2 is well controlled-Keep working on [employer] and exercise   Recent consult visits: 04/20/21-Family Medicine- Heywood Footman- no data found 03/26/21-Ophthalmology-Richard Groat- no data found  Hospital visits: None in previous 6 months   Objective:  Lab Results  Component Value Date   CREATININE 0.87 01/31/2021   BUN 14 01/31/2021   GFR 66.75 01/31/2021   GFRNONAA >60 05/31/2009   GFRAA  05/31/2009    >60        The eGFR has been calculated using the MDRD equation. This calculation has not been validated in all clinical situations. eGFR's persistently <60 mL/min signify possible Chronic Kidney Disease.   NA 140 01/31/2021   K 4.7 01/31/2021   CALCIUM 9.8 01/31/2021   CO2 27 01/31/2021   GLUCOSE 122 (H) 01/31/2021    Lab Results  Component Value Date/Time   HGBA1C 6.2 01/31/2021 09:08 AM   HGBA1C 6.4 10/30/2020 07:39 AM   GFR 66.75 01/31/2021 09:08 AM   GFR 60.17 10/30/2020 07:39 AM   MICROALBUR <0.7 11/02/2015 08:01 AM    Last diabetic Eye exam:  Lab Results  Component Value Date/Time   HMDIABEYEEXA No Retinopathy 07/22/2021 12:00 AM    Last diabetic Foot exam: No results found for: "HMDIABFOOTEX"   Lab Results  Component Value Date   CHOL 124 01/31/2021   HDL 41.70 01/31/2021   LDLCALC 52 01/31/2021   LDLDIRECT 70.0 05/07/2016   TRIG 153.0 (H) 01/31/2021   CHOLHDL 3 01/31/2021       Latest Ref Rng & Units 01/31/2021    9:08 AM 10/30/2020    7:39 AM 04/12/2020    8:15 AM  Hepatic Function  Total Protein 6.0 - 8.3 g/dL 6.9  6.9  7.0   Albumin 3.5 - 5.2 g/dL 4.2  4.2  4.2   AST 0 - 37 U/L  20  18  23    ALT 0 - 35 U/L 16  14  18    Alk Phosphatase 39 - 117 U/L 30  31  36   Total Bilirubin 0.2 - 1.2 mg/dL 0.4  0.4  0.5     Lab Results  Component Value Date/Time   TSH 0.80 01/31/2021 09:08 AM   TSH 2.92 04/12/2020 08:15 AM   FREET4 1.18 03/22/2013 11:58 AM   FREET4 0.77 08/08/2010 02:23 PM       Latest Ref Rng & Units 01/31/2021    9:08 AM 04/12/2020    8:15 AM 01/11/2019    8:15 AM  CBC  WBC 4.0 - 10.5 K/uL 7.6  8.0  7.2   Hemoglobin 12.0 - 15.0 g/dL 13.8   13.6  13.6   Hematocrit 36.0 - 46.0 % 41.6  40.4  40.3   Platelets 150.0 - 400.0 K/uL 346.0  356.0  315.0     Lab Results  Component Value Date/Time   VD25OH 41 01/23/2011 04:14 PM   VD25OH 43 01/22/2010 01:05 PM    Clinical ASCVD: No  The ASCVD Risk score (Arnett DK, et al., 2019) failed to calculate for the following reasons:   The valid total cholesterol range is 130 to 320 mg/dL       01/31/2021    8:20 AM 04/17/2020    8:51 AM 01/17/2019    8:24 AM  Depression screen PHQ 2/9  Decreased Interest 0 0 0  Down, Depressed, Hopeless 0 0 0  PHQ - 2 Score 0 0 0  Altered sleeping  0   Tired, decreased energy  0   Change in appetite  0   Feeling bad or failure about yourself   0   Trouble concentrating  0   Moving slowly or fidgety/restless  0   Suicidal thoughts  0   PHQ-9 Score  0   Difficult doing work/chores  Not difficult at all      Social History   Tobacco Use  Smoking Status Never  Smokeless Tobacco Never   BP Readings from Last 3 Encounters:  01/31/21 134/70  11/06/20 116/66  08/07/20 135/70   Pulse Readings from Last 3 Encounters:  01/31/21 74  11/06/20 66  08/07/20 72   Wt Readings from Last 3 Encounters:  01/31/21 167 lb 4 oz (75.9 kg)  11/06/20 170 lb (77.1 kg)  08/07/20 169 lb 4 oz (76.8 kg)   BMI Readings from Last 3 Encounters:  01/31/21 26.99 kg/m  11/06/20 27.65 kg/m  08/07/20 27.53 kg/m    Assessment/Interventions: Review of patient past medical history, allergies, medications, health status, including review of consultants reports, laboratory and other test data, was performed as part of comprehensive evaluation and provision of chronic care management services.   SDOH:  (Social Determinants of Health) assessments and interventions performed: No  SDOH Screenings   Alcohol Screen: Not on file  Depression (PHQ2-9): Low Risk  (01/31/2021)   Depression (PHQ2-9)    PHQ-2 Score: 0  Financial Resource Strain: Low Risk  (12/10/2020)    Overall Financial Resource Strain (CARDIA)    Difficulty of Paying Living Expenses: Not very hard  Food Insecurity: No Food Insecurity (12/23/2019)   Hunger Vital Sign    Worried About Running Out of Food in the Last Year: Never true    Ran Out of Food in the Last Year: Never true  Housing: Low Risk  (12/23/2019)   Housing    Last Housing Risk Score: 0  Physical Activity: Not on file  Social Connections: Not on file  Stress: Not on file  Tobacco Use: Low Risk  (08/19/2021)   Patient History    Smoking Tobacco Use: Never    Smokeless Tobacco Use: Never    Passive Exposure: Not on file  Transportation Needs: No Transportation Needs (12/23/2019)   PRAPARE - Transportation    Lack of Transportation (Medical): No    Lack of Transportation (Non-Medical): No    CCM Care Plan  Allergies  Allergen Reactions   Metoprolol     Lips swelled/ hands swelled and her skin turned red    Sulfonamide Derivatives     Medications Reviewed Today     Reviewed by Charlton Haws, [facility] (Pharmacist) on 12/18/21 at 0859  Med List Status: <None>   Medication Order Taking? Sig Documenting Provider Last Dose Status Informant  atorvastatin (LIPITOR) 10 MG tablet [id number] Yes Take 1/2 (one-half) tablet by mouth once daily [name], [name], MD Taking Active   Cholecalciferol (VITAMIN D3) 2000 UNITS TABS [id number] Yes Take by mouth. [provider] Taking Active   fenofibrate 54 MG tablet [id number] Yes Take 1 tablet (54 mg total) by mouth daily. [name], [name], MD Taking Active   levothyroxine (SYNTHROID) 125 MCG tablet [id number] Yes TAKE 1 TABLET BY MOUTH ONCE DAILY BEFORE BREAKFAST [name], [name], MD Taking Active   losartan (COZAAR) 25 MG tablet [id number] Yes Take 1 tablet (25 mg total) by mouth daily. [name], [name], MD Taking Active   Magnesium 400 MG CAPS [id number] Yes Take 1 tablet by mouth 3 (three) times a week. [provider] Taking Active   metFORMIN (GLUCOPHAGE) 1000 MG tablet  [id number] Yes TAKE 1 TABLET BY MOUTH TWICE DAILY WITH A MEAL [name], [name], MD Taking Active   Multiple Vitamins-Minerals (MULTIVITAMIN,TX-MINERALS) tablet [id number] Yes Take 1 tablet by mouth 2 (two) times a week. [provider] Taking Active   Omega-3 Fatty Acids (OMEGA 3 PO) [id number] Yes Take 1 capsule by mouth 2 (two) times a week.  [provider] Taking Active   vitamin C (ASCORBIC ACID) 500 MG tablet [id number] Yes Take 500 mg by mouth daily. [provider] Taking Active   Vitamin E 400 units TABS [id number] Yes Take 800 Units by mouth daily. [provider] Taking Active   zinc gluconate 50 MG tablet [id number] Yes Take 50 mg by mouth 2 (two) times a week. [provider] Taking Active             Patient Active Problem List   Diagnosis Date Noted   Hearing loss 01/31/2021   Plantar fasciitis of left foot 08/26/2018   Estrogen deficiency 11/03/2017   Osteopenia 11/03/2017   Leg cramps 11/18/2016   Family history of congenital heart disease 11/18/2016   Cough due to ACE inhibitor 11/18/2016   Routine general medical examination at a health care facility 03/26/2015   Colon cancer screening 03/24/2014   Encounter for Medicare annual wellness exam 03/24/2014   Diabetes type 2, controlled ([place]) 03/17/2014   Benign paroxysmal positional vertigo 08/26/2013   OSA (obstructive sleep apnea) 08/10/2013   Chronic mastitis of left breast 07/04/2013   History of breast cancer 02/10/2012   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   BACK PAIN, CHRONIC 05/10/2007   PEPTIC ULCER DISEASE, HX OF 05/10/2007   HYSTERECTOMY, HX OF 05/10/2007   TONSILLECTOMY, HX OF 05/10/2007   Hypothyroidism 01/28/2007   Hyperlipidemia associated with type 2 diabetes mellitus ([place]) 01/28/2007  Overweight 01/28/2007   COMMON MIGRAINE 01/28/2007   Essential hypertension 01/28/2007   FATTY LIVER DISEASE 01/28/2007   CARPAL TUNNEL SYNDROME, HX OF 01/27/2007    Immunization  History  Administered Date(s) Administered   Fluad Quad(high Dose 65+) 01/17/2019, 01/31/2021   H1N1 03/06/2008   Influenza Whole 03/06/2008   Influenza, High Dose Seasonal PF 03/28/2016   Influenza,inj,Quad PF,6+ Mos 03/24/2014, 03/26/2015, 03/10/2017   Influenza-Unspecified 02/02/2013, 02/18/2018, 03/08/2020   PFIZER(Purple Top)SARS-COV-2 Vaccination 06/25/2019, 07/19/2019   Pneumococcal Conjugate-13 03/22/2013   Pneumococcal Polysaccharide-23 09/02/2011, 03/10/2017   Td 08/07/2008    Conditions to be addressed/monitored:  Hypertension, Hyperlipidemia, Diabetes, and Hypothyroidism  Care Plan : [place]  Updates made by Charlton Haws, [place] since 12/18/2021 12:00 AM     Problem: Hypertension, Hyperlipidemia, Diabetes, and Hypothyroidism   Priority: High     Long-Range Goal: Disease mgmt   Start Date: 06/11/2020  Expected End Date: 12/18/2021  This Visit's Progress: On track  Recent Progress: On track  Priority: High  Note:   Current Barriers:  None identified   Pharmacist Clinical Goal(s):  Patient will contact provider office for questions/concerns as evidenced notation of same in electronic health record through collaboration with PharmD and provider.   Interventions: 1:1 collaboration with [name], [name], MD regarding development and update of comprehensive plan of care as evidenced by provider attestation and co-signature Inter-disciplinary care team collaboration (see longitudinal plan of care) Comprehensive medication review performed; medication list updated in electronic medical record  Interventions: 1:1 collaboration with [name], [name], MD regarding development and update of comprehensive plan of care as evidenced by provider attestation and co-signature Inter-disciplinary care team collaboration (see longitudinal plan of care) Comprehensive medication review performed; medication list updated in electronic medical record  Hypertension (BP  goal <140/90) -Controlled - per clinic and home readings -Current home readings: 122/63, 132/60, 134/63, 128/60, 132/65, 124/61 123/61, 124/58 -Denies hypotensive/hypertensive symptoms. Denies falls. -Current treatment: Losartan 25 mg daily - Appropriate, Effective, Safe, Accessible -Medications previously tried: n/a -Counseled to monitor BP at home periodically -Recommended to continue current medication  Hyperlipidemia: (LDL goal < 70) -Controlled - LDL 59 (01/2021) at goal -Current treatment: Atovastatin 10 mg - 1/2 tablet daily - Appropriate, Effective, Safe, Accessible Fenofibrate 54 mg daily -Appropriate, Effective, Safe, Accessible Omega 3 fatty acids - 2-3 days per week -Appropriate, Effective, Safe, Accessible -Medications previously tried: none  -Pertinent history - Reports triglycerides came down from 800s after starting fenofibrate.  -Assessed adherence - patient confirms and refill timely -Recommended to continue current medications  Diabetes (A1c goal <7%) -Controlled - A1c  6.2% (01/2021) at goal -Current home glucose readings - not checking blood glucose  -Denies hypoglycemic/hyperglycemic symptoms -Current medications: Metformin 1000 mg BID -Appropriate, Effective, Safe, Accessible -Medications previously tried: n/a -Diet: Limits starches, increased salads/vegetables from her garden -Exercise: Keeping great granddaughter, stays active watching her, mows lawn, keeps garden -She is working hard on weight loss and limiting carbs, happy with improved A1c. Her goal is to come off metformin. -Foot exam completed by PCP 10/2020, no abnormalities Recommended to continue current medication   Hypothyroidism (Goal TSH: 0.4-4.5) -Controlled - pt takes thyroid with Vitamin E and Vitamin D before breakfast, TSH is at goal this way -Current treatment: Levothyroxine 125 mcg daily - Appropriate, Effective, Safe, Accessible -Counseled to take medication as she has  been -Recommended to continue current medication  Patient Goals/Self-Care Activities Patient will:  - take medications as prescribed as evidenced by patient report  and record review focus on medication adherence by routine check blood pressure periodically, document, and provide at future appointments       Medication Assistance: None required.  Patient affirms current coverage meets needs.  Compliance/Adherence/Medication fill history: Care Gaps: A1c (due 07/31/21)  Star-Rating Drugs: Atorvastatin - PDC 100% Losartan - PDC 100% Metformin - PDC 100%  Medication Access: Within the past 30 days, how often has patient missed a dose of medication? 0 Is a pillbox or other method used to improve adherence? Yes  Factors that may affect medication adherence? no barriers identified Are meds synced by current pharmacy? No  Are meds delivered by current pharmacy? No  Does patient experience delays in picking up medications due to transportation concerns? No   [facility] Services Reviewed: Is patient disadvantaged to use [facility]?: No  Current Rx insurance plan: HTA Name and location of Current pharmacy:  [place], [place] - [address] [place] [address] Phone: [phone] Fax: [phone]  [facility] services reviewed with patient today?: No  Patient requests to transfer care to [facility]?: No  Reason patient declined to change pharmacies: Not mentioned at this visit   Care Plan and Follow Up Patient Decision:  Patient agrees to Care Plan and Follow-up.  Plan: The patient has been provided with contact information for the care management team and has been advised to call with any health related questions or concerns.   Charlene Brooke, PharmD, BCACP Clinical Pharmacist [facility] at [facility] [phone]

## 2022-01-19 ENCOUNTER — Telehealth: Payer: Self-pay | Admitting: Family Medicine

## 2022-01-19 DIAGNOSIS — E039 Hypothyroidism, unspecified: Secondary | ICD-10-CM

## 2022-01-19 DIAGNOSIS — E1169 Type 2 diabetes mellitus with other specified complication: Secondary | ICD-10-CM

## 2022-01-19 DIAGNOSIS — E119 Type 2 diabetes mellitus without complications: Secondary | ICD-10-CM

## 2022-01-19 DIAGNOSIS — I10 Essential (primary) hypertension: Secondary | ICD-10-CM

## 2022-01-19 NOTE — Telephone Encounter (Signed)
-----   Message from Ellamae Sia sent at 01/14/2022  3:20 PM EDT ----- Regarding: Lab orders for Wednesday, 9.20.23 Patient is scheduled for CPX labs, please order future labs, Thanks , Karna Christmas

## 2022-01-22 ENCOUNTER — Other Ambulatory Visit (INDEPENDENT_AMBULATORY_CARE_PROVIDER_SITE_OTHER): Payer: PPO

## 2022-01-22 DIAGNOSIS — I10 Essential (primary) hypertension: Secondary | ICD-10-CM

## 2022-01-22 DIAGNOSIS — E1169 Type 2 diabetes mellitus with other specified complication: Secondary | ICD-10-CM

## 2022-01-22 DIAGNOSIS — E119 Type 2 diabetes mellitus without complications: Secondary | ICD-10-CM

## 2022-01-22 DIAGNOSIS — E039 Hypothyroidism, unspecified: Secondary | ICD-10-CM

## 2022-01-22 DIAGNOSIS — E785 Hyperlipidemia, unspecified: Secondary | ICD-10-CM

## 2022-01-22 LAB — CBC WITH DIFFERENTIAL/PLATELET
Basophils Absolute: 0.1 10*3/uL (ref 0.0–0.1)
Basophils Relative: 0.9 % (ref 0.0–3.0)
Eosinophils Absolute: 0.4 10*3/uL (ref 0.0–0.7)
Eosinophils Relative: 6.9 % — ABNORMAL HIGH (ref 0.0–5.0)
HCT: 39.7 % (ref 36.0–46.0)
Hemoglobin: 13.3 g/dL (ref 12.0–15.0)
Lymphocytes Relative: 28 % (ref 12.0–46.0)
Lymphs Abs: 1.7 10*3/uL (ref 0.7–4.0)
MCHC: 33.5 g/dL (ref 30.0–36.0)
MCV: 90.1 fl (ref 78.0–100.0)
Monocytes Absolute: 0.5 10*3/uL (ref 0.1–1.0)
Monocytes Relative: 7.9 % (ref 3.0–12.0)
Neutro Abs: 3.4 10*3/uL (ref 1.4–7.7)
Neutrophils Relative %: 56.3 % (ref 43.0–77.0)
Platelets: 322 10*3/uL (ref 150.0–400.0)
RBC: 4.41 Mil/uL (ref 3.87–5.11)
RDW: 13.1 % (ref 11.5–15.5)
WBC: 6.1 10*3/uL (ref 4.0–10.5)

## 2022-01-22 LAB — COMPREHENSIVE METABOLIC PANEL
ALT: 11 U/L (ref 0–35)
AST: 14 U/L (ref 0–37)
Albumin: 3.9 g/dL (ref 3.5–5.2)
Alkaline Phosphatase: 32 U/L — ABNORMAL LOW (ref 39–117)
BUN: 12 mg/dL (ref 6–23)
CO2: 27 mEq/L (ref 19–32)
Calcium: 9.7 mg/dL (ref 8.4–10.5)
Chloride: 106 mEq/L (ref 96–112)
Creatinine, Ser: 0.95 mg/dL (ref 0.40–1.20)
GFR: 59.65 mL/min — ABNORMAL LOW (ref >60.00)
Glucose, Bld: 112 mg/dL — ABNORMAL HIGH (ref 70–99)
Potassium: 4.6 mEq/L (ref 3.5–5.1)
Sodium: 140 mEq/L (ref 135–145)
Total Bilirubin: 0.5 mg/dL (ref 0.2–1.2)
Total Protein: 6.5 g/dL (ref 6.0–8.3)

## 2022-01-22 LAB — LIPID PANEL
Cholesterol: 107 mg/dL (ref 0–200)
HDL: 45.5 mg/dL (ref >39.00)
LDL Cholesterol: 44 mg/dL (ref 0–99)
NonHDL: 61.96
Total CHOL/HDL Ratio: 2
Triglycerides: 89 mg/dL (ref 0.0–149.0)
VLDL: 17.8 mg/dL (ref 0.0–40.0)

## 2022-01-22 LAB — MICROALBUMIN / CREATININE URINE RATIO
Creatinine,U: 97.8 mg/dL
Microalb Creat Ratio: 0.7 mg/g (ref 0.0–30.0)
Microalb, Ur: <0.7 mg/dL (ref 0.0–1.9)

## 2022-01-22 LAB — HEMOGLOBIN A1C: Hgb A1c MFr Bld: 6.1 % (ref 4.6–6.5)

## 2022-01-22 LAB — TSH: TSH: 0.37 u[IU]/mL (ref 0.35–5.50)

## 2022-01-29 ENCOUNTER — Ambulatory Visit (INDEPENDENT_AMBULATORY_CARE_PROVIDER_SITE_OTHER): Payer: PPO | Admitting: Family Medicine

## 2022-01-29 ENCOUNTER — Ambulatory Visit: Payer: PPO

## 2022-01-29 ENCOUNTER — Encounter: Payer: Self-pay | Admitting: Family Medicine

## 2022-01-29 VITALS — BP 134/70 | HR 74 | Temp 97.9°F | Ht 65.75 in | Wt 166.1 lb

## 2022-01-29 DIAGNOSIS — E119 Type 2 diabetes mellitus without complications: Secondary | ICD-10-CM

## 2022-01-29 DIAGNOSIS — Z0000 Encounter for general adult medical examination without abnormal findings: Secondary | ICD-10-CM | POA: Diagnosis not present

## 2022-01-29 DIAGNOSIS — M8589 Other specified disorders of bone density and structure, multiple sites: Secondary | ICD-10-CM

## 2022-01-29 DIAGNOSIS — H903 Sensorineural hearing loss, bilateral: Secondary | ICD-10-CM

## 2022-01-29 DIAGNOSIS — L304 Erythema intertrigo: Secondary | ICD-10-CM | POA: Diagnosis not present

## 2022-01-29 DIAGNOSIS — E039 Hypothyroidism, unspecified: Secondary | ICD-10-CM | POA: Diagnosis not present

## 2022-01-29 DIAGNOSIS — E785 Hyperlipidemia, unspecified: Secondary | ICD-10-CM

## 2022-01-29 DIAGNOSIS — I10 Essential (primary) hypertension: Secondary | ICD-10-CM

## 2022-01-29 DIAGNOSIS — E1169 Type 2 diabetes mellitus with other specified complication: Secondary | ICD-10-CM

## 2022-01-29 DIAGNOSIS — Z23 Encounter for immunization: Secondary | ICD-10-CM | POA: Diagnosis not present

## 2022-01-29 MED ORDER — ATORVASTATIN CALCIUM 10 MG PO TABS: ORAL_TABLET | ORAL | 3 refills | Status: DC

## 2022-01-29 MED ORDER — LOSARTAN POTASSIUM 25 MG PO TABS: 25.0000 mg | ORAL_TABLET | Freq: Every day | ORAL | 3 refills | Status: DC

## 2022-01-29 MED ORDER — FENOFIBRATE 54 MG PO TABS: 54.0000 mg | ORAL_TABLET | Freq: Every day | ORAL | 3 refills | Status: DC

## 2022-01-29 MED ORDER — KETOCONAZOLE 2 % EX CREA: 1.0000 | TOPICAL_CREAM | Freq: Every day | CUTANEOUS | 2 refills | Status: DC

## 2022-01-29 MED ORDER — METFORMIN HCL 1000 MG PO TABS: 1000.0000 mg | ORAL_TABLET | Freq: Two times a day (BID) | ORAL | 3 refills | Status: DC

## 2022-01-29 MED ORDER — LEVOTHYROXINE SODIUM 125 MCG PO TABS: 125.0000 ug | ORAL_TABLET | Freq: Every day | ORAL | 3 refills | Status: DC

## 2022-01-29 NOTE — Assessment & Plan Note (Signed)
dexa utd 02/2021 No falls or fx On vit D Good exercise

## 2022-01-29 NOTE — Assessment & Plan Note (Signed)
Reviewed health habits including diet and exercise and skin cancer prevention Reviewed appropriate screening tests for age  Also reviewed health mt list, fam hx and immunization status , as well as social and family history   See HPI Labs reviewed  Flu shot given Eye exam utd  Mammogram utd (personal b ca hx) Colonoscopy utd 2019 dexa utd   No falls or fx on vit D

## 2022-01-29 NOTE — Assessment & Plan Note (Signed)
Lab Results  Component Value Date   HGBA1C 6.1 01/22/2022   Very well controlled  Continue metformin 1000 mg bid Watch GFR On arb and statin  Eye exam utd Nl microalb  Nl foot exam  F/u 6 mo

## 2022-01-29 NOTE — Assessment & Plan Note (Signed)
bp in fair control at this time  BP Readings from Last 1 Encounters:  01/29/22 134/70   No changes needed Most recent labs reviewed  Disc lifstyle change with low sodium diet and exercise  Plan to continue losartan 25 mg daily  Will watch GFR

## 2022-01-29 NOTE — Assessment & Plan Note (Signed)
In groin area inst to keep it dry /pic app clothing  Change when sweaty   Px ketoconazole cream 2% Update if not starting to improve in a week or if worsening

## 2022-01-29 NOTE — Patient Instructions (Addendum)
Make sure you eat protein with meals and snacks   Keep your rash area clean and dry  Try the ketoconazole cream  Once better-use corn starch to keep dry and wear the right clothing   Flu shot today   Labs look good overall   Follow up in 6 months   Keep up the good work with diet and exercise

## 2022-01-29 NOTE — Assessment & Plan Note (Signed)
Hypothyroidism  Pt has no clinical changes No change in energy level/ hair or skin/ edema and no tremor Lab Results  Component Value Date   TSH 0.37 01/22/2022    Plan to continue levothyroxine 125 mcg daily

## 2022-01-29 NOTE — Assessment & Plan Note (Signed)
Disc goals for lipids and reasons to control them Rev last labs with pt Rev low sat fat diet in detail  LDL at goal- 44 HDL stable Trig down  Plan to continue fenofibrate 54 mg daily and atorvastatin 5 mg daily

## 2022-01-29 NOTE — Assessment & Plan Note (Signed)
Doing well with hearing aides

## 2022-01-29 NOTE — Progress Notes (Signed)
Subjective:    Patient ID: Carla Mccoy, female    DOB: [birth year], 71 y.o.   MRN: [id number]  HPI Here for health maintenance exam and to review chronic medical problems    Wt Readings from Last 3 Encounters:  01/29/22 166 lb 2 oz (75.4 kg)  01/31/21 167 lb 4 oz (75.9 kg)  11/06/20 170 lb (77.1 kg)   27.02 kg/m Lost some  Then gained a few back  Drinking more water  164 lb on her scale at home     Immunization History  Administered Date(s) Administered   Fluad Quad(high Dose 65+) 01/17/2019, 01/31/2021   H1N1 03/06/2008   Influenza Whole 03/06/2008   Influenza, High Dose Seasonal PF 03/28/2016   Influenza,inj,Quad PF,6+ Mos 03/24/2014, 03/26/2015, 03/10/2017   Influenza-Unspecified 02/02/2013, 02/18/2018, 03/08/2020   PFIZER(Purple Top)SARS-COV-2 Vaccination 06/25/2019, 07/19/2019   Pneumococcal Conjugate-13 03/22/2013   Pneumococcal Polysaccharide-23 09/02/2011, 03/10/2017   Td 08/07/2008   Health Maintenance Due  Topic Date Due   COVID-19 Vaccine (3 - Pfizer series) 09/13/2019   INFLUENZA VACCINE  12/03/2021    Flu shot-today   Eye exam 07/2021  Has hearing aides now- very helpful   Rash under abdomen/in groin Itches  ? Yeast    Mammogram 08/2021 Self breast exam : no lumps   Colon cancer screening : colonoscopy 05/2017 -10 years / no recall due to age likely   Dexa  02/2021  osteopenia  Falls- none  Fractures-none Supplements : vitamin D  Exercise : 3-4 times per week or working outside   HTN bp is stable today  No cp or palpitations or headaches or edema  No side effects to medicines  BP Readings from Last 3 Encounters:  01/29/22 134/70  01/31/21 134/70  11/06/20 116/66    Losartan 25 mg daily   Lab Results  Component Value Date   CREATININE 0.95 01/22/2022   BUN 12 01/22/2022   NA 140 01/22/2022   K 4.6 01/22/2022   CL 106 01/22/2022   CO2 27 01/22/2022    Hypothyroidism  Pt has no clinical changes No change in energy level/  hair or skin/ edema and no tremor Lab Results  Component Value Date   TSH 0.37 01/22/2022    Levothyroxine 125 mcg daily    DM2 Lab Results  Component Value Date   HGBA1C 6.1 01/22/2022   This is down from 6.2  Very well controlled  Loves fruit  Does make effort to get in protein   Metformin 1000 mg bid  Arb Statin  Eye exam utd  Lab Results  Component Value Date   MICROALBUR <0.7 01/22/2022   MICROALBUR <0.7 11/02/2015    Hyperlipidemia Lab Results  Component Value Date   CHOL 107 01/22/2022   CHOL 124 01/31/2021   CHOL 110 10/30/2020   Lab Results  Component Value Date   HDL 45.50 01/22/2022   HDL 41.70 01/31/2021   HDL 36.70 (L) 10/30/2020   Lab Results  Component Value Date   LDLCALC 44 01/22/2022   LDLCALC 52 01/31/2021   LDLCALC 48 10/30/2020   Lab Results  Component Value Date   TRIG 89.0 01/22/2022   TRIG 153.0 (H) 01/31/2021   TRIG 125.0 10/30/2020   Lab Results  Component Value Date   CHOLHDL 2 01/22/2022   CHOLHDL 3 01/31/2021   CHOLHDL 3 10/30/2020   Lab Results  Component Value Date   LDLDIRECT 70.0 05/07/2016   LDLDIRECT 85.0 11/02/2015   LDLDIRECT 93.0 03/19/2015  Fenofibrate 54 mg daily  Atorvastatin 5 mg daily  Diet is very good ! LDL and trig down    Patient Active Problem List   Diagnosis Date Noted   Intertrigo 01/29/2022   Hearing loss 01/31/2021   Estrogen deficiency 11/03/2017   Osteopenia 11/03/2017   Leg cramps 11/18/2016   Family history of congenital heart disease 11/18/2016   Cough due to ACE inhibitor 11/18/2016   Routine general medical examination at a health care facility 03/26/2015   Colon cancer screening 03/24/2014   Encounter for Medicare annual wellness exam 03/24/2014   Diabetes type 2, controlled ([place]) 03/17/2014   Benign paroxysmal positional vertigo 08/26/2013   OSA (obstructive sleep apnea) 08/10/2013   Chronic mastitis of left breast 07/04/2013   History of breast cancer 02/10/2012    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   BACK PAIN, CHRONIC 05/10/2007   PEPTIC ULCER DISEASE, HX OF 05/10/2007   HYSTERECTOMY, HX OF 05/10/2007   TONSILLECTOMY, HX OF 05/10/2007   Hypothyroidism 01/28/2007   Hyperlipidemia associated with type 2 diabetes mellitus ([place]) 01/28/2007   Overweight 01/28/2007   COMMON MIGRAINE 01/28/2007   Essential hypertension 01/28/2007   FATTY LIVER DISEASE 01/28/2007   CARPAL TUNNEL SYNDROME, HX OF 01/27/2007   Past Medical History:  Diagnosis Date   Allergy    Angioneurotic edema not elsewhere classified    ACE-I   Arthritis    Backache, unspecified    breast ca dx'd 06/2009   breast cancer left - lumpectomy   Diabetes mellitus without complication ([facility])    Full dentures    Migraine without aura, without mention of intractable migraine without mention of status migrainosus    Obesity, unspecified    Other abnormal glucose    Other and unspecified hyperlipidemia    Other chronic nonalcoholic liver disease    Personal history of peptic ulcer disease    Personal history of radiation therapy    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 Cornett, MD;  Location: [facility];  Service: General;  Laterality: Left;   INCISION AND DRAINAGE OF WOUND Left 12/21/2013   Procedure: DEBRIDEMENT OF LEFT BREAST WOUND WITH PLACEMENT OF A CELL;  Surgeon: Theodoro Kos, DO;  Location: [facility];  Service: Plastics;  Laterality: Left;   LAPAROSCOPY ABDOMEN DIAGNOSTIC     TONSILLECTOMY     TUBAL LIGATION  1971   Social History   Tobacco Use   Smoking status: Never   Smokeless tobacco: Never  Vaping Use   Vaping Use: Never used  Substance Use Topics   Alcohol use: No    Alcohol/week: 0.0 standard drinks of alcohol   Drug use: No   Family History  Problem Relation Age of Onset   Peripheral vascular disease Mother    Coronary artery disease Mother    Hyperlipidemia  Mother    Heart disease Mother        CAD/fatal MI   Diabetes Mother    Stroke Mother    Diabetes Sister    Hypothyroidism Sister    Breast cancer Maternal Grandmother    Diabetes Brother    Hyperlipidemia Brother    Heart disease Brother    Hypertrophic cardiomyopathy Brother        congenital   Colon cancer Neg Hx    Esophageal cancer Neg Hx    Pancreatic cancer Neg Hx    Stomach cancer Neg Hx    Rectal cancer Neg Hx    Allergies  Allergen Reactions   Metoprolol     Lips swelled/ hands swelled and her skin turned red    Sulfonamide Derivatives    Current Outpatient Medications on File Prior to Visit  Medication Sig Dispense Refill   Cholecalciferol (VITAMIN D3) 2000 UNITS TABS Take by mouth.     Magnesium 400 MG CAPS Take 1 tablet by mouth 3 (three) times a week.     Multiple Vitamins-Minerals (MULTIVITAMIN,TX-MINERALS) tablet Take 1 tablet by mouth 2 (two) times a week.     Omega-3 Fatty Acids (OMEGA 3 PO) Take 1 capsule by mouth 2 (two) times a week.      vitamin C (ASCORBIC ACID) 500 MG tablet Take 500 mg by mouth daily.     Vitamin E 400 units TABS Take 800 Units by mouth daily.     zinc gluconate 50 MG tablet Take 50 mg by mouth 2 (two) times a week.     No current facility-administered medications on file prior to visit.     Review of Systems  Constitutional:  Negative for activity change, appetite change, fatigue, fever and unexpected weight change.  HENT:   Negative for congestion, ear pain, rhinorrhea, sinus pressure and sore throat.   Eyes:  Negative for pain, redness and visual disturbance.  Respiratory:  Negative for cough, shortness of breath and wheezing.   Cardiovascular:  Negative for chest pain and palpitations.  Gastrointestinal:  Negative for abdominal pain, blood in stool, constipation and diarrhea.  Endocrine: Negative for polydipsia and polyuria.  Genitourinary:  Negative for dysuria, frequency and urgency.  Musculoskeletal:  Negative for arthralgias, back pain and myalgias.  Skin:  Positive for rash. Negative for pallor.  Allergic/Immunologic: Negative for environmental allergies.  Neurological:  Negative for dizziness, syncope and headaches.  Hematological:  Negative for adenopathy. Does not bruise/bleed easily.  Psychiatric/Behavioral:  Negative for decreased concentration and dysphoric mood. The patient is not nervous/anxious.        Objective:   Physical Exam Constitutional:      General: She is not in acute distress.    Appearance: Normal appearance. She is well-developed. She is not ill-appearing or diaphoretic.  HENT:     Head: Normocephalic and atraumatic.     Right Ear: Tympanic membrane, ear canal and external ear normal.     Left Ear: Tympanic membrane, ear canal and external ear normal.     Nose: Nose normal. No congestion.     Mouth/Throat:     Mouth: Mucous membranes are moist.     Pharynx: Oropharynx is clear. No posterior oropharyngeal erythema.  Eyes:     General: No scleral icterus.    Extraocular Movements: Extraocular movements intact.     Conjunctiva/sclera: Conjunctivae normal.     Pupils: Pupils are equal, round, and reactive to light.  Neck:     Thyroid: No thyromegaly.  Vascular: No carotid bruit or JVD.  Cardiovascular:     Rate and Rhythm: Normal rate and regular rhythm.     Pulses: Normal pulses.     Heart sounds: Normal heart sounds.     No gallop.  Pulmonary:     Effort:  Pulmonary effort is normal. No respiratory distress.     Breath sounds: Normal breath sounds. No wheezing.     Comments: Good air exch Chest:     Chest wall: No tenderness.  Abdominal:     General: Bowel sounds are normal. There is no distension or abdominal bruit.     Palpations: Abdomen is soft. There is no mass.     Tenderness: There is no abdominal tenderness.     Hernia: No hernia is present.  Genitourinary:    Comments: Breast exam: No mass, nodules, thickening, tenderness, bulging, retraction, inflamation, nipple discharge or skin changes noted.  No axillary or clavicular LA.     Musculoskeletal:        General: No tenderness. Normal range of motion.     Cervical back: Normal range of motion and neck supple. No rigidity. No muscular tenderness.     Right lower leg: No edema.     Left lower leg: No edema.     Comments: No kyphosis   Lymphadenopathy:     Cervical: No cervical adenopathy.  Skin:    General: Skin is warm and dry.     Coloration: Skin is not pale.     Findings: No erythema or rash.     Comments: Erythema in thigh creases with sharp border and scant scale No satellite lesions   Solar lentigines diffusely   Neurological:     Mental Status: She is alert. Mental status is at baseline.     Cranial Nerves: No cranial nerve deficit.     Motor: No abnormal muscle tone.     Coordination: Coordination normal.     Gait: Gait normal.     Deep Tendon Reflexes: Reflexes are normal and symmetric.  Psychiatric:        Mood and Affect: Mood normal.        Cognition and Memory: Cognition and memory normal.           Assessment & Plan:   Problem List Items Addressed This Visit       Cardiovascular and Mediastinum   Essential hypertension    bp in fair control at this time  BP Readings from Last 1 Encounters:  01/29/22 134/70  No changes needed Most recent labs reviewed  Disc lifstyle change with low sodium diet and exercise  Plan to continue losartan 25 mg  daily  Will watch GFR      Relevant Medications   losartan (COZAAR) 25 MG tablet   fenofibrate 54 MG tablet   atorvastatin (LIPITOR) 10 MG tablet     Endocrine   Diabetes type 2, controlled ([place])    Lab Results  Component Value Date   HGBA1C 6.1 01/22/2022  Very well controlled  Continue metformin 1000 mg bid Watch GFR On arb and statin  Eye exam utd Nl microalb  Nl foot exam  F/u 6 mo      Relevant Medications   losartan (COZAAR) 25 MG tablet   metFORMIN (GLUCOPHAGE) 1000 MG tablet   atorvastatin (LIPITOR) 10 MG tablet   Hyperlipidemia associated with type 2 diabetes mellitus ([place])    Disc goals for lipids and reasons to control them Rev last labs with pt  Rev low sat fat diet in detail  LDL at goal- 44 HDL stable Trig down  Plan to continue fenofibrate 54 mg daily and atorvastatin 5 mg daily        Relevant Medications   losartan (COZAAR) 25 MG tablet   metFORMIN (GLUCOPHAGE) 1000 MG tablet   fenofibrate 54 MG tablet   atorvastatin (LIPITOR) 10 MG tablet   Hypothyroidism    Hypothyroidism  Pt has no clinical changes No change in energy level/ hair or skin/ edema and no tremor Lab Results  Component Value Date   TSH 0.37 01/22/2022    Plan to continue levothyroxine 125 mcg daily       Relevant Medications   levothyroxine (SYNTHROID) 125 MCG tablet     Nervous and Auditory   Hearing loss    Doing well with hearing aides        Musculoskeletal and Integument   Intertrigo    In groin area inst to keep it dry /pic app clothing  Change when sweaty   Px ketoconazole cream 2% Update if not starting to improve in a week or if worsening        Osteopenia    dexa utd 02/2021 No falls or fx On vit D Good exercise         Other   Routine general medical examination at a health care facility - Primary    Reviewed health habits including diet and exercise and skin cancer prevention Reviewed appropriate screening tests for age  Also reviewed  health mt list, fam hx and immunization status , as well as social and family history   See HPI Labs reviewed  Flu shot given Eye exam utd  Mammogram utd (personal b ca hx) Colonoscopy utd 2019 dexa utd   No falls or fx on vit D       Other Visit Diagnoses     Need for influenza vaccination       Relevant Orders   Flu Vaccine QUAD High Dose(Fluad) (Completed)

## 2022-05-13 ENCOUNTER — Other Ambulatory Visit: Payer: Self-pay | Admitting: Family Medicine

## 2022-05-27 ENCOUNTER — Ambulatory Visit (INDEPENDENT_AMBULATORY_CARE_PROVIDER_SITE_OTHER): Payer: PPO | Admitting: Family Medicine

## 2022-05-27 ENCOUNTER — Encounter: Payer: Self-pay | Admitting: Family Medicine

## 2022-05-27 ENCOUNTER — Telehealth: Payer: Self-pay | Admitting: Family Medicine

## 2022-05-27 VITALS — BP 126/62 | HR 70 | Temp 97.6°F | Ht 65.75 in | Wt 168.1 lb

## 2022-05-27 DIAGNOSIS — N3000 Acute cystitis without hematuria: Secondary | ICD-10-CM | POA: Insufficient documentation

## 2022-05-27 DIAGNOSIS — R300 Dysuria: Secondary | ICD-10-CM

## 2022-05-27 LAB — POC URINALSYSI DIPSTICK (AUTOMATED)
Bilirubin, UA: NEGATIVE
Blood, UA: NEGATIVE
Glucose, UA: POSITIVE — AB
Ketones, UA: NEGATIVE
Nitrite, UA: NEGATIVE
Protein, UA: NEGATIVE
Spec Grav, UA: 1.015 (ref 1.010–1.025)
Urobilinogen, UA: 0.2 E.U./dL
pH, UA: 6 (ref 5.0–8.0)

## 2022-05-27 MED ORDER — CEPHALEXIN 500 MG PO CAPS: 500.0000 mg | ORAL_CAPSULE | Freq: Two times a day (BID) | ORAL | 0 refills | Status: DC

## 2022-05-27 NOTE — Assessment & Plan Note (Signed)
Mildly pos ua with voiding symptoms and bladder pressure and low grade temp at home  Cx pending  Px keflex to start now  Inst to call if symptoms worsen in the meantime   Enc good fluid intake  Handout given

## 2022-05-27 NOTE — Progress Notes (Signed)
Subjective:    Patient ID: Carla Mccoy, female    DOB: 10-26-1950, 72 y.o.   MRN: [id number]  HPI Pt presents for urinary symptoms    Wt Readings from Last 3 Encounters:  05/27/22 168 lb 2 oz (76.3 kg)  01/29/22 166 lb 2 oz (75.4 kg)  01/31/21 167 lb 4 oz (75.9 kg)   27.34 kg/m  Vitals:   05/27/22 0934  BP: (!) 156/76  Pulse: 88  Temp: 97.6 F (36.4 C)  SpO2: 100%   Forgot bp med today   Started 3 wk ago  Tried to drink a lot of water   Burns to urinate  Frequency/urgency  Bladder pressure  No blood in urine but urine looks darker  May be some odor to urine-hard to tell   No nausea  Had temp 99.7 (elevated for her)   Forgot her bp med  A little headache today    Ua Sm leuk Glucose   Results for orders placed or performed in visit on 05/27/22  POCT Urinalysis Dipstick (Automated)  Result Value Ref Range   Color, UA Yellow    Clarity, UA Hazy    Glucose, UA Positive (A) Negative   Bilirubin, UA Negative    Ketones, UA Negative    Spec Grav, UA 1.015 1.010 - 1.025   Blood, UA Negative    pH, UA 6.0 5.0 - 8.0   Protein, UA Negative Negative   Urobilinogen, UA 0.2 0.2 or 1.0 E.U./dL   Nitrite, UA Negative    Leukocytes, UA Small (1+) (A) Negative     Lab Results  Component Value Date   HGBA1C 6.1 01/22/2022    Lab Results  Component Value Date   CREATININE 0.95 01/22/2022   BUN 12 01/22/2022   NA 140 01/22/2022   K 4.6 01/22/2022   CL 106 01/22/2022   CO2 27 01/22/2022   GFR 59.6  Patient Active Problem List   Diagnosis Date Noted   Acute cystitis 05/27/2022   Intertrigo 01/29/2022   Hearing loss 01/31/2021   Estrogen deficiency 11/03/2017   Osteopenia 11/03/2017   Leg cramps 11/18/2016   Family history of congenital heart disease 11/18/2016   Cough due to ACE inhibitor 11/18/2016   Routine general medical examination at a health care facility 03/26/2015   Colon cancer screening 03/24/2014   Encounter for Medicare annual  wellness exam 03/24/2014   Diabetes type 2, controlled ([place]) 03/17/2014   Benign paroxysmal positional vertigo 08/26/2013   OSA (obstructive sleep apnea) 08/10/2013   Chronic mastitis of left breast 07/04/2013   History of breast cancer 02/10/2012   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   BACK PAIN, CHRONIC 05/10/2007   PEPTIC ULCER DISEASE, HX OF 05/10/2007   HYSTERECTOMY, HX OF 05/10/2007   TONSILLECTOMY, HX OF 05/10/2007   Hypothyroidism 01/28/2007   Hyperlipidemia associated with type 2 diabetes mellitus ([place]) 01/28/2007   Overweight 01/28/2007   COMMON MIGRAINE 01/28/2007   Essential hypertension 01/28/2007   FATTY LIVER DISEASE 01/28/2007   CARPAL TUNNEL SYNDROME, HX OF 01/27/2007   Past Medical History:  Diagnosis Date   Allergy    Angioneurotic edema not elsewhere classified    ACE-I   Arthritis    Backache, unspecified    breast ca dx'd 06/2009   breast cancer left - lumpectomy   Diabetes mellitus without complication ([facility])    Full dentures    Migraine without aura, without mention of intractable migraine without mention of status migrainosus    Obesity, unspecified  Other abnormal glucose    Other and unspecified hyperlipidemia    Other chronic nonalcoholic liver disease    Personal history of peptic ulcer disease    Personal history of radiation therapy    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  Cornett, MD;  Location: [facility];  Service: General;  Laterality: Left;   INCISION AND DRAINAGE OF WOUND Left 12/21/2013   Procedure: DEBRIDEMENT OF LEFT BREAST WOUND WITH PLACEMENT OF A CELL;  Surgeon: Theodoro Kos, DO;  Location: [facility];  Service: Plastics;  Laterality: Left;   LAPAROSCOPY ABDOMEN DIAGNOSTIC     TONSILLECTOMY     TUBAL LIGATION  1971   Social History   Tobacco Use   Smoking status: Never   Smokeless tobacco: Never  Vaping Use   Vaping Use: Never used  Substance Use Topics   Alcohol use: No    Alcohol/week: 0.0 standard drinks of alcohol   Drug use: No   Family History  Problem Relation Age of Onset   Peripheral vascular disease Mother    Coronary artery disease Mother    Hyperlipidemia Mother    Heart disease Mother        CAD/fatal MI   Diabetes Mother    Stroke Mother    Diabetes Sister    Hypothyroidism Sister    Breast cancer Maternal Grandmother    Diabetes Brother    Hyperlipidemia Brother    Heart disease Brother    Hypertrophic cardiomyopathy Brother        congenital   Colon cancer Neg Hx    Esophageal cancer Neg Hx    Pancreatic cancer Neg Hx    Stomach cancer Neg Hx    Rectal cancer Neg Hx    Allergies  Allergen Reactions   Metoprolol     Lips swelled/ hands swelled and her skin turned red    Sulfonamide Derivatives    Current Outpatient Medications on File Prior to Visit  Medication Sig Dispense Refill   atorvastatin (LIPITOR) 10 MG tablet Take 1/2 (one-half) tablet by mouth once daily 45 tablet 3   Cholecalciferol (VITAMIN D3) 2000 UNITS TABS Take by mouth.     fenofibrate 54 MG tablet Take 1 tablet (54 mg total) by mouth daily. 90 tablet 3   ketoconazole (NIZORAL) 2 % cream Apply 1 Application topically daily. To rash/affected area 15 g 2   levothyroxine (SYNTHROID) 125 MCG tablet Take 1 tablet (125 mcg total) by mouth daily before breakfast. 90 tablet 3   losartan (COZAAR) 25 MG tablet Take 1  tablet (25 mg total) by mouth daily. 90 tablet 3   Magnesium 400 MG CAPS Take 1 tablet by mouth 3 (three) times a week.     metFORMIN (GLUCOPHAGE) 1000 MG tablet Take 1 tablet (1,000 mg total) by mouth  2 (two) times daily with a meal. 180 tablet 3   Multiple Vitamins-Minerals (MULTIVITAMIN,TX-MINERALS) tablet Take 1 tablet by mouth 2 (two) times a week.     Omega-3 Fatty Acids (OMEGA 3 PO) Take 1 capsule by mouth 2 (two) times a week.      vitamin C (ASCORBIC ACID) 500 MG tablet Take 500 mg by mouth daily.     Vitamin E 400 units TABS Take 800 Units by mouth daily.     zinc gluconate 50 MG tablet Take 50 mg by mouth 2 (two) times a week.     No current facility-administered medications on file prior to visit.     Review of Systems  Constitutional:  Positive for fatigue. Negative for activity change, appetite change and fever.  HENT:  Negative for congestion and sore throat.   Eyes:  Negative for itching and visual disturbance.  Respiratory:  Negative for cough and shortness of breath.   Cardiovascular:  Negative for leg swelling.  Gastrointestinal:  Negative for abdominal distention, abdominal pain, constipation, diarrhea and nausea.  Endocrine: Negative for cold intolerance and polydipsia.  Genitourinary:  Positive for dysuria, frequency and urgency. Negative for difficulty urinating, flank pain, hematuria and pelvic pain.  Musculoskeletal:  Negative for myalgias.  Skin:  Negative for rash.  Allergic/Immunologic: Negative for immunocompromised state.  Neurological:  Negative for dizziness and weakness.  Hematological:  Negative for adenopathy.       Objective:   Physical Exam Constitutional:      General: She is not in acute distress.    Appearance: Normal appearance. She is well-developed and normal weight. She is not ill-appearing or diaphoretic.  HENT:     Head: Normocephalic and atraumatic.  Eyes:     Conjunctiva/sclera: Conjunctivae normal.     Pupils: Pupils are equal,  round, and reactive to light.  Cardiovascular:     Rate and Rhythm: Normal rate and regular rhythm.     Heart sounds: Normal heart sounds.  Pulmonary:     Effort: Pulmonary effort is normal.     Breath sounds: Normal breath sounds.  Abdominal:     General: Bowel sounds are normal. There is no distension.     Palpations: Abdomen is soft.     Tenderness: There is abdominal tenderness. There is no right CVA tenderness, left CVA tenderness or rebound.     Comments: No cva tenderness  Mild suprapubic tenderness  Musculoskeletal:     Cervical back: Normal range of motion and neck supple.  Lymphadenopathy:     Cervical: No cervical adenopathy.  Skin:    Findings: No rash.  Neurological:     Mental Status: She is alert.  Psychiatric:        Mood and Affect: Mood normal.           Assessment & Plan:   Problem List Items Addressed This Visit       Genitourinary   Acute cystitis - Primary    Mildly pos ua with voiding symptoms and bladder pressure and low grade temp at home  Cx pending  Px keflex to start now  Inst to call if symptoms worsen in the meantime   Enc good fluid intake  Handout given         Relevant Orders   Urine Culture   Other Visit Diagnoses     Dysuria       Relevant Orders   POCT Urinalysis Dipstick (Automated) (Completed)

## 2022-05-27 NOTE — Telephone Encounter (Signed)
Aware-added to visit  Thanks

## 2022-05-27 NOTE — Patient Instructions (Addendum)
Take the generic keflex as directed for uti  Drink lots of water   If symptoms suddenly worsen let us know   We will call you with urine culture result when it returns   Take care of yourself   Take your blood pressure medicine when you get home

## 2022-05-27 NOTE — Telephone Encounter (Signed)
Patient called and stated she was told to call back and leave her blood pressure reading 126/62 pulse is 70.

## 2022-05-29 LAB — URINE CULTURE
MICRO NUMBER:: 14461053
SPECIMEN QUALITY:: ADEQUATE

## 2022-06-25 ENCOUNTER — Ambulatory Visit (INDEPENDENT_AMBULATORY_CARE_PROVIDER_SITE_OTHER): Payer: PPO

## 2022-06-25 VITALS — Ht 65.0 in | Wt 163.0 lb

## 2022-06-25 DIAGNOSIS — Z0000 Encounter for general adult medical examination without abnormal findings: Secondary | ICD-10-CM | POA: Diagnosis not present

## 2022-06-25 NOTE — Patient Instructions (Addendum)
Carla Mccoy , Thank you for taking time to come for your Medicare Wellness Visit. I appreciate your ongoing commitment to your health goals. Please review the following plan we discussed and let me know if I can assist you in the future.   These are the goals we discussed:  Goals      Increase physical activity     Starting 06/26/2016, I will continue to exercise for 30 min 5-6 days per week.      Lifestyle Change-Diabetes     Timeframe:  Long-Range Goal Priority:  High Start Date:   06/11/2020                          Expected End Date:   none                    Follow Up Date 12/10/2020 9:30 AM   - Exercise at least 5 days a week, with a goal of 150 minutes weekly    Why is this important?   The changes that you are asked to make may be hard to do.  This is especially true when the changes are life-long.  Knowing why it is important to you is the first step.  Working on the change with your family or support person helps you not feel alone.  Reward yourself and family or support person when goals are met. This can be an activity you choose like bowling, hiking, biking, swimming or shooting hoops.     Notes: Activities include Charlean Sanfilippo and walking 1/2 mile around house     Patient Stated     Starting 10/19/2017, I will continue to take medications as prescribed.       Patient Stated     Get to 160lbs and 5.9 A1c     Pharmacy Care Plan     CARE PLAN ENTRY (see longitudinal plan of care for additional care plan information)  Current Barriers:  Chronic Disease Management support, education, and care coordination needs related to Hypertension, Hyperlipidemia, and Diabetes   Hypertension BP Readings from Last 3 Encounters:  07/22/19 132/70  01/17/19 129/78  05/10/18 128/72  Pharmacist Clinical Goal(s): Over the next 90 days, patient will work with PharmD and providers to maintain BP goal <130/80 Current regimen:  Losartan 25 mg daily Interventions: Recommended begin  checking blood pressure twice weekly. Discussed potential of increasing losartan if home readings remain above goal.  Patient self care activities - Over the next 90 days, patient will: Check BP twice weekly , document, and provide at future appointments Ensure daily salt intake < 2300 mg/day  Hyperlipidemia Lab Results  Component Value Date/Time   LDLCALC 54 07/22/2019 08:56 AM   LDLDIRECT 70.0 05/07/2016 08:04 AM  Pharmacist Clinical Goal(s): Over the next 90 days, patient will work with PharmD and providers to maintain LDL goal < 70 Current regimen:  Atorvastatin 5 mg daily Fenofibrate 54 mg daily Omega 3 twice weekly Interventions: Recommend continuing active lifestyle and avoiding fried or fatty foods for optimal cholesterol management.  Patient self care activities - Over the next 90 days, patient will: Continue to take medications as prescribed.   Diabetes Lab Results  Component Value Date/Time   HGBA1C 7.1 (H) 07/22/2019 08:56 AM   HGBA1C 7.2 (H) 01/11/2019 08:15 AM  Pharmacist Clinical Goal(s): Over the next 90 days, patient will work with PharmD and providers to achieve A1c goal <7% Current regimen:  Metformin 500 mg  twice daily Interventions: Discussed patient's personal preference to get A1c back down below 7. Patient feels that being at home more during 2020 caused her A1c to be higher than normal. She is due for a follow-up visit in September.  Patient self care activities - Over the next 90 days, patient will: Continue to take medications as prescribed.   Medication management Pharmacist Clinical Goal(s): Over the next 90 days, patient will work with PharmD and providers to maintain optimal medication adherence Current pharmacy: [facility]  Interventions Comprehensive medication review performed. Continue current medication management strategy' Recommended patient get updated tetanus shot and consider Shingrix vaccine.  Patient self care activities - Over the  next 90 days, patient will: Focus on medication adherence by continuing to use a pill box.  Take medications as prescribed Report any questions or concerns to PharmD and/or provider(s)  Initial goal documentation         This is a list of the screening recommended for you and due dates:  Health Maintenance  Topic Date Due   Zoster (Shingles) Vaccine (1 of 2) Never done   DTaP/Tdap/Td vaccine (2 - Tdap) 08/08/2018   COVID-19 Vaccine (3 - 2023-24 season) 01/03/2022   Hemoglobin A1C  07/23/2022   Eye exam for diabetics  07/23/2022   Yearly kidney function blood test for diabetes  01/23/2023   Yearly kidney health urinalysis for diabetes  01/23/2023   Complete foot exam   01/30/2023   Medicare Annual Wellness Visit  06/26/2023   Mammogram  08/20/2023   Colon Cancer Screening  05/15/2027   Pneumonia Vaccine  Completed   Flu Shot  Completed   DEXA scan (bone density measurement)  Completed   Hepatitis C Screening: USPSTF Recommendation to screen - Ages 27-79 yo.  Completed   HPV Vaccine  Aged Out    Advanced directives: Advance directive discussed with you today. Even though you declined this today, please call our office should you change your mind, and we can give you the proper paperwork for you to fill out.   Conditions/risks identified: none  Next appointment: Follow up in one year for your annual wellness visit 06/30/2023 @ 11:30 am via telephone.   Preventive Care 60 Years and Older, Female Preventive care refers to lifestyle choices and visits with your health care provider that can promote health and wellness. What does preventive care include? A yearly physical exam. This is also called an annual well check. Dental exams once or twice a year. Routine eye exams. Ask your health care provider how often you should have your eyes checked. Personal lifestyle choices, including: Daily care of your teeth and gums. Regular physical activity. Eating a healthy  diet. Avoiding tobacco and drug use. Limiting alcohol use. Practicing safe sex. Taking low-dose aspirin every day. Taking vitamin and mineral supplements as recommended by your health care provider. What happens during an annual well check? The services and screenings done by your health care provider during your annual well check will depend on your age, overall health, lifestyle risk factors, and family history of disease. Counseling  Your health care provider may ask you questions about your: Alcohol use. Tobacco use. Drug use. Emotional well-being. Home and relationship well-being. Sexual activity. Eating habits. History of falls. Memory and ability to understand (cognition). Work and work Statistician. Reproductive health. Screening  You may have the following tests or measurements: Height, weight, and BMI. Blood pressure. Lipid and cholesterol levels. These may be checked every 5 years, or more frequently  if you are over 55 years old. Skin check. Lung cancer screening. You may have this screening every year starting at age 23 if you have a 30-pack-year history of smoking and currently smoke or have quit within the past 15 years. Fecal occult blood test (FOBT) of the stool. You may have this test every year starting at age 95. Flexible sigmoidoscopy or colonoscopy. You may have a sigmoidoscopy every 5 years or a colonoscopy every 10 years starting at age 93. Hepatitis C blood test. Hepatitis B blood test. Sexually transmitted disease (STD) testing. Diabetes screening. This is done by checking your blood sugar (glucose) after you have not eaten for a while (fasting). You may have this done every 1-3 years. Bone density scan. This is done to screen for osteoporosis. You may have this done starting at age 15. Mammogram. This may be done every 1-2 years. Talk to your health care provider about how often you should have regular mammograms. Talk with your health care provider about  your test results, treatment options, and if necessary, the need for more tests. Vaccines  Your health care provider may recommend certain vaccines, such as: Influenza vaccine. This is recommended every year. Tetanus, diphtheria, and acellular pertussis (Tdap, Td) vaccine. You may need a Td booster every 10 years. Zoster vaccine. You may need this after age 65. Pneumococcal 13-valent conjugate (PCV13) vaccine. One dose is recommended after age 17. Pneumococcal polysaccharide (PPSV23) vaccine. One dose is recommended after age 51. Talk to your health care provider about which screenings and vaccines you need and how often you need them. This information is not intended to replace advice given to you by your health care provider. Make sure you discuss any questions you have with your health care provider. Document Released: 05/18/2015 Document Revised: 01/09/2016 Document Reviewed: 02/20/2015 Elsevier Interactive Patient Education  2017 [place] Prevention in the Home Falls can cause injuries. They can happen to people of all ages. There are many things you can do to make your home safe and to help prevent falls. What can I do on the outside of my home? Regularly fix the edges of walkways and driveways and fix any cracks. Remove anything that might make you trip as you walk through a door, such as a raised step or threshold. Trim any bushes or trees on the path to your home. Use bright outdoor lighting. Clear any walking paths of anything that might make someone trip, such as rocks or tools. Regularly check to see if handrails are loose or broken. Make sure that both sides of any steps have handrails. Any raised decks and porches should have guardrails on the edges. Have any leaves, snow, or ice cleared regularly. Use sand or salt on walking paths during winter. Clean up any spills in your garage right away. This includes oil or grease spills. What can I do in the bathroom? Use  night lights. Install grab bars by the toilet and in the tub and shower. Do not use towel bars as grab bars. Use non-skid mats or decals in the tub or shower. If you need to sit down in the shower, use a plastic, non-slip stool. Keep the floor dry. Clean up any water that spills on the floor as soon as it happens. Remove soap buildup in the tub or shower regularly. Attach bath mats securely with double-sided non-slip rug tape. Do not have throw rugs and other things on the floor that can make you trip. What can I  do in the bedroom? Use night lights. Make sure that you have a light by your bed that is easy to reach. Do not use any sheets or blankets that are too big for your bed. They should not hang down onto the floor. Have a firm chair that has side arms. You can use this for support while you get dressed. Do not have throw rugs and other things on the floor that can make you trip. What can I do in the kitchen? Clean up any spills right away. Avoid walking on wet floors. Keep items that you use a lot in easy-to-reach places. If you need to reach something above you, use a strong step stool that has a grab bar. Keep electrical cords out of the way. Do not use floor polish or wax that makes floors slippery. If you must use wax, use non-skid floor wax. Do not have throw rugs and other things on the floor that can make you trip. What can I do with my stairs? Do not leave any items on the stairs. Make sure that there are handrails on both sides of the stairs and use them. Fix handrails that are broken or loose. Make sure that handrails are as long as the stairways. Check any carpeting to make sure that it is firmly attached to the stairs. Fix any carpet that is loose or worn. Avoid having throw rugs at the top or bottom of the stairs. If you do have throw rugs, attach them to the floor with carpet tape. Make sure that you have a light switch at the top of the stairs and the bottom of the  stairs. If you do not have them, ask someone to add them for you. What else can I do to help prevent falls? Wear shoes that: Do not have high heels. Have rubber bottoms. Are comfortable and fit you well. Are closed at the toe. Do not wear sandals. If you use a stepladder: Make sure that it is fully opened. Do not climb a closed stepladder. Make sure that both sides of the stepladder are locked into place. Ask someone to hold it for you, if possible. Clearly mark and make sure that you can see: Any grab bars or handrails. First and last steps. Where the edge of each step is. Use tools that help you move around (mobility aids) if they are needed. These include: Canes. Walkers. Scooters. Crutches. Turn on the lights when you go into a dark area. Replace any light bulbs as soon as they burn out. Set up your furniture so you have a clear path. Avoid moving your furniture around. If any of your floors are uneven, fix them. If there are any pets around you, be aware of where they are. Review your medicines with your doctor. Some medicines can make you feel dizzy. This can increase your chance of falling. Ask your doctor what other things that you can do to help prevent falls. This information is not intended to replace advice given to you by your health care provider. Make sure you discuss any questions you have with your health care provider. Document Released: 02/15/2009 Document Revised: 09/27/2015 Document Reviewed: 05/26/2014 Elsevier Interactive Patient Education  2017 [employer].

## 2022-06-25 NOTE — Progress Notes (Signed)
I connected with  Carla Mccoy on 06/25/22 by a audio enabled telemedicine application and verified that I am speaking with the correct person using two identifiers.  Patient Location: Home  Provider Location: Home Office  I discussed the limitations of evaluation and management by telemedicine. The patient expressed understanding and agreed to proceed.  Subjective:   Carla Mccoy is a 72 y.o. female who presents for Medicare Annual (Subsequent) preventive examination.  Review of Systems     Cardiac Risk Factors include: advanced age (>76mn, >>67women);diabetes mellitus;hypertension;dyslipidemia     Objective:    Today's Vitals   06/25/22 0848 06/25/22 0849  Weight: 163 lb (73.9 kg)   Height: 5' 5"$  (1.651 m)   PainSc:  3    Body mass index is 27.12 kg/m.     06/25/2022    9:06 AM 10/19/2017   10:39 AM 06/26/2016   10:12 AM 09/11/2015    8:05 PM 12/19/2013   10:47 AM 09/12/2013   11:25 AM  Advanced Directives  Does Patient Have a Medical Advance Directive? No No No No No Patient would like information  Copy of [place] Chart?     No - copy requested   Would patient like information on creating a medical advance directive? No - Patient declined No - Patient declined No - Patient declined Yes - Educational materials given No - patient declined information Advance directive packet given    Current Medications (verified) Outpatient Encounter Medications as of 06/25/2022  Medication Sig   atorvastatin (LIPITOR) 10 MG tablet Take 1/2 (one-half) tablet by mouth once daily   Cholecalciferol (VITAMIN D3) 2000 UNITS TABS Take by mouth.   fenofibrate 54 MG tablet Take 1 tablet (54 mg total) by mouth daily.   ketoconazole (NIZORAL) 2 % cream Apply 1 Application topically daily. To rash/affected area   levothyroxine (SYNTHROID) 125 MCG tablet Take 1 tablet (125 mcg total) by mouth daily before breakfast.   losartan (COZAAR) 25 MG tablet Take 1 tablet (25 mg  total) by mouth daily.   Magnesium 400 MG CAPS Take 1 tablet by mouth 3 (three) times a week.   metFORMIN (GLUCOPHAGE) 1000 MG tablet Take 1 tablet (1,000 mg total) by mouth 2 (two) times daily with a meal.   Multiple Vitamins-Minerals (MULTIVITAMIN,TX-MINERALS) tablet Take 1 tablet by mouth 2 (two) times a week.   Omega-3 Fatty Acids (OMEGA 3 PO) Take 1 capsule by mouth 2 (two) times a week.    vitamin C (ASCORBIC ACID) 500 MG tablet Take 500 mg by mouth daily.   Vitamin E 400 units TABS Take 800 Units by mouth daily.   cephALEXin (KEFLEX) 500 MG capsule Take 1 capsule (500 mg total) by mouth 2 (two) times daily. (Patient not taking: Reported on 06/25/2022)   zinc gluconate 50 MG tablet Take 50 mg by mouth 2 (two) times a week. (Patient not taking: Reported on 06/25/2022)   No facility-administered encounter medications on file as of 06/25/2022.    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    ACE-I   Arthritis    Backache, unspecified    breast ca dx'd 06/2009   breast cancer left - lumpectomy   Diabetes mellitus without complication ([place]    Full dentures    Migraine without aura, without mention of intractable migraine without mention of status migrainosus  Obesity, unspecified    Other abnormal glucose    Other and unspecified hyperlipidemia    Other chronic nonalcoholic liver disease    Personal history of peptic ulcer disease    Personal history of radiation therapy    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 Cornett, MD;  Location: [facility];  Service: General;  Laterality: Left;   INCISION AND DRAINAGE OF WOUND Left 12/21/2013   Procedure: DEBRIDEMENT OF LEFT BREAST WOUND WITH PLACEMENT OF A CELL;  Surgeon: Theodoro Kos, DO;  Location: [facility];  Service: Plastics;  Laterality: Left;   LAPAROSCOPY ABDOMEN DIAGNOSTIC     TONSILLECTOMY     TUBAL LIGATION  1971   Family History  Problem Relation Age of Onset   Peripheral vascular disease Mother    Coronary artery disease Mother    Hyperlipidemia Mother    Heart disease Mother        CAD/fatal MI   Diabetes Mother    Stroke Mother    Diabetes Sister    Hypothyroidism Sister    Breast cancer Maternal Grandmother    Diabetes Brother    Hyperlipidemia Brother    Heart disease Brother    Hypertrophic cardiomyopathy Brother        congenital   Colon cancer Neg Hx    Esophageal cancer Neg Hx    Pancreatic cancer Neg Hx    Stomach cancer Neg Hx    Rectal cancer Neg Hx    Social History   Socioeconomic History   Marital status: Married    Spouse name: Not on file   Number of children: Not on file   Years of education: 59   Highest education level: Not on file  Occupational History   Occupation: Cabin crew: [employer]  Tobacco Use   Smoking status: Never   Smokeless tobacco: Never  Vaping Use   Vaping Use: Never used  Substance and Sexual Activity   Alcohol use: No    Alcohol/week: 0.0 standard drinks of alcohol   Drug use: No   Sexual activity: Yes    Partners: Male  Other Topics Concern   Not on file  Social History Narrative   HSG, [employer] - 3 yrs, no degree. Married -'52  1 son -'26, 1 daughter-'70; 2 grandchildren.  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  Marriage is in good health. She enjoys her work.                        Social Determinants of Health   Financial Resource Strain: Low Risk  (06/25/2022)   Overall Financial Resource Strain (CARDIA)    Difficulty of Paying Living Expenses: Not hard at all  Food Insecurity: No Food Insecurity (06/25/2022)  Hunger Vital Sign    Worried About Running Out of Food in the Last Year: Never true    Ran Out of Food in the Last Year: Never true  Transportation Needs: No Transportation Needs (06/25/2022)   PRAPARE - Hydrologist (Medical): No    Lack of Transportation (Non-Medical): No  Physical Activity: Sufficiently Active (06/25/2022)   Exercise Vital Sign    Days of Exercise per Week: 6 days    Minutes of Exercise per Session: 40 min  Stress: No Stress Concern Present (06/25/2022)   [place]    Feeling of Stress : Not at all  Social Connections: Moderately Integrated (06/25/2022)   Social Connection and Isolation Panel [NHANES]    Frequency of Communication with Friends and Family: More than three times a week    Frequency of Social Gatherings with Friends and Family: More than three times a week    Attends Religious Services: More than 4 times per year    Active Member of [employer] or Organizations: No    Attends Music therapist: Never    Marital Status: Married    Tobacco Counseling Counseling given: Not Answered   Clinical Intake:  Pre-visit preparation completed: Yes  Pain : 0-10 Pain Score: 3  Pain Type: Acute pain Pain Location: Leg Pain Orientation: Posterior, Left Pain Descriptors / Indicators: Cramping Pain Frequency: Intermittent     Nutritional Risks: None Diabetes: Yes CBG done?: No Did pt. bring in CBG monitor from home?: No  How often do you need to have someone help you when you read instructions, pamphlets, or other  written materials from your doctor or pharmacy?: 1 - Never  Diabetic?Nutrition Risk Assessment:  Has the patient had any N/V/D within the last 2 months?  No  Does the patient have any non-healing wounds?  No  Has the patient had any unintentional weight loss or weight gain?  No   Diabetes:  Is the patient diabetic?  Yes  If diabetic, was a CBG obtained today?  No  Did the patient bring in their glucometer from home?  No  How often do you monitor your CBG's? Does not monitor at home.   Financial Strains and Diabetes Management:  Are you having any financial strains with the device, your supplies or your medication? No .  Does the patient want to be seen by Chronic Care Management for management of their diabetes?  No  Would the patient like to be referred to a Nutritionist or for Diabetic Management?  No   Diabetic Exams:  Diabetic Eye Exam: Completed 07/22/2021 Dr.Groat. Appt. Scheduled for March 2024. Diabetic Foot Exam: Completed 01/29/2022 PCP    Interpreter Needed?: No  Information entered by :: C.Carrin Vannostrand LPN   Activities of Daily Living    06/25/2022    9:08 AM  In your present state of health, do you have any difficulty performing the following activities:  Hearing? 1  Comment wears aids  Vision? 0  Difficulty concentrating or making decisions? 0  Walking or climbing stairs? 0  Dressing or bathing? 0  Doing errands, shopping? 0  Preparing Food and eating ? N  Using the Toilet? N  In the past six months, have you accidently leaked urine? N  Do you have problems with loss of bowel control? N  Managing your Medications? N  Managing your Finances? N  Housekeeping or managing your Housekeeping? N    Patient Care  Team: [name], [name], MD as PCP - General (Family Medicine) Romine, Lubertha South, MD (Obstetrics and Gynecology) Verl Blalock, Marijo Conception, MD (Inactive) (Cardiology) Love, Alyson Locket, MD (Neurology) Sable Feil, MD (Gastroenterology) Magrinat, Virgie Dad, MD  (Inactive) (Hematology and Oncology) Erroll Luna, MD (General Surgery) Sharol Roussel, [place] (Optometry) Clent Jacks, MD (Ophthalmology) Charlton Haws, [facility] as Pharmacist (Pharmacist)  Indicate any recent Medical Services you may have received from other than [facility] providers in the past year (date may be approximate).     Assessment:   This is a routine wellness examination for Carla Mccoy.  Hearing/Vision screen Hearing Screening - Comments:: Wears aids Vision Screening - Comments:: Wears glasses -Dr.Groat   Dietary issues and exercise activities discussed: Current Exercise Habits: Structured exercise class, Type of exercise: strength training/weights;walking, Time (Minutes): 40, Frequency (Times/Week): 6, Weekly Exercise (Minutes/Week): 240, Intensity: Moderate, Exercise limited by: None identified   Goals Addressed             This Visit's Progress    Patient Stated       Get to 160lbs and 5.9 A1c       Depression Screen    06/25/2022    9:01 AM 05/27/2022   10:44 AM 01/31/2021    8:20 AM 04/17/2020    8:51 AM 01/17/2019    8:24 AM 10/19/2017   10:38 AM 06/26/2016   10:10 AM  PHQ 2/9 Scores  PHQ - 2 Score 0 0 0 0 0 0 0  PHQ- 9 Score 0 0  0  0     Fall Risk    06/25/2022    9:08 AM 05/27/2022   10:44 AM 01/31/2021    8:20 AM 11/06/2020    8:17 AM 01/17/2019    8:24 AM  Fall Risk   Falls in the past year? 0 0 0 0 0  Number falls in past yr: 0 0  0 0  Injury with Fall? 0 0  0 0  Risk for fall due to : No Fall Risks No Fall Risks     Follow up Falls prevention discussed;Falls evaluation completed Falls evaluation completed   Falls evaluation completed    FALL RISK PREVENTION PERTAINING TO THE HOME:  Any stairs in or around the home? Yes  If so, are there any without handrails? Yes  Home free of loose throw rugs in walkways, pet beds, electrical cords, etc? Yes  Adequate lighting in your home to reduce risk of falls? Yes   ASSISTIVE DEVICES UTILIZED TO PREVENT  FALLS:  Life alert? No  Use of a cane, walker or w/c? No  Grab bars in the bathroom? Yes  Shower chair or bench in shower? Yes  Elevated toilet seat or a handicapped toilet? Yes    Cognitive Function:    10/19/2017   10:38 AM 06/26/2016   10:08 AM  MMSE - Mini Mental State Exam  Orientation to time 5 5  Orientation to Place 5 5  Registration 3 3  Attention/ Calculation 0 0  Recall 3 3  Language- name 2 objects 0 0  Language- repeat 1 1  Language- follow 3 step command 3 3  Language- read & follow direction 0 0  Write a sentence 0 0  Copy design 0 0  Total score 20 20        06/25/2022    9:23 AM  6CIT Screen  What Year? 0 points  What month? 0 points  What time? 0 points  Count back from  20 0 points  Months in reverse 0 points  Repeat phrase 0 points  Total Score 0 points    Immunizations Immunization History  Administered Date(s) Administered   Fluad Quad(high Dose 65+) 01/17/2019, 01/31/2021, 01/29/2022   H1N1 03/06/2008   Influenza Whole 03/06/2008   Influenza, High Dose Seasonal PF 03/28/2016   Influenza,inj,Quad PF,6+ Mos 03/24/2014, 03/26/2015, 03/10/2017   Influenza-Unspecified 02/02/2013, 02/18/2018, 03/08/2020   PFIZER(Purple Top)SARS-COV-2 Vaccination 06/25/2019, 07/19/2019   Pneumococcal Conjugate-13 03/22/2013   Pneumococcal Polysaccharide-23 09/02/2011, 03/10/2017   Td 08/07/2008    TDAP status: Due, Education has been provided regarding the importance of this vaccine. Advised may receive this vaccine at local pharmacy or Health Dept. Aware to provide a copy of the vaccination record if obtained from local pharmacy or Health Dept. Verbalized acceptance and understanding.  Flu Vaccine status: Up to date  Pneumococcal vaccine status: Up to date  Covid-19 vaccine status: Declined, Education has been provided regarding the importance of this vaccine but patient still declined. Advised may receive this vaccine at local pharmacy or Health Dept.or  vaccine clinic. Aware to provide a copy of the vaccination record if obtained from local pharmacy or Health Dept. Verbalized acceptance and understanding.  Qualifies for Shingles Vaccine? Yes   Zostavax completed No   Shingrix Completed?: No.    Education has been provided regarding the importance of this vaccine. Patient has been advised to call insurance company to determine out of pocket expense if they have not yet received this vaccine. Advised may also receive vaccine at local pharmacy or Health Dept. Verbalized acceptance and understanding.  Screening Tests Health Maintenance  Topic Date Due   Zoster Vaccines- Shingrix (1 of 2) Never done   DTaP/Tdap/Td (2 - Tdap) 08/08/2018   COVID-19 Vaccine (3 - 2023-24 season) 01/03/2022   HEMOGLOBIN A1C  07/23/2022   OPHTHALMOLOGY EXAM  07/23/2022   Diabetic kidney evaluation - eGFR measurement  01/23/2023   Diabetic kidney evaluation - Urine ACR  01/23/2023   FOOT EXAM  01/30/2023   Medicare Annual Wellness (AWV)  06/26/2023   MAMMOGRAM  08/20/2023   COLONOSCOPY (Pts 45-87yr Insurance coverage will need to be confirmed)  05/15/2027   Pneumonia Vaccine 72 Years old  Completed   INFLUENZA VACCINE  Completed   DEXA SCAN  Completed   Hepatitis C Screening  Completed   HPV VACCINES  Aged Out    Health Maintenance  Health Maintenance Due  Topic Date Due   Zoster Vaccines- Shingrix (1 of 2) Never done   DTaP/Tdap/Td (2 - Tdap) 08/08/2018   COVID-19 Vaccine (3 - 2023-24 season) 01/03/2022    Colorectal cancer screening: Type of screening: Colonoscopy. Completed 05/14/2017. Repeat every 10 years  Mammogram status: Completed 08/19/2021. Repeat every year Pt will call to schedule.  Bone Density status: Completed 02/07/2021. Results reflect: Bone density results: OSTEOPENIA. Repeat every 2 years. Pt declined.  Lung Cancer Screening: (Low Dose CT Chest recommended if Age 74-80years, 30 pack-year currently smoking OR have quit w/in  15years.) does not qualify.   Lung Cancer Screening Referral: no  Additional Screening:  Hepatitis C Screening: does not qualify; Completed 06/26/2016  Vision Screening: Recommended annual ophthalmology exams for early detection of glaucoma and other disorders of the eye. Is the patient up to date with their annual eye exam?  Yes  Who is the provider or what is the name of the office in which the patient attends annual eye exams? Dr.Groat If pt is not established with a provider,  would they like to be referred to a provider to establish care? No .   Dental Screening: Recommended annual dental exams for proper oral hygiene  Community Resource Referral / Chronic Care Management: CRR required this visit?  No   CCM required this visit?  No      Plan:     I have personally reviewed and noted the following in the patient's chart:   Medical and social history Use of alcohol, tobacco or illicit drugs  Current medications and supplements including opioid prescriptions. Patient is not currently taking opioid prescriptions. Functional ability and status Nutritional status Physical activity Advanced directives List of other physicians Hospitalizations, surgeries, and ER visits in previous 12 months Vitals Screenings to include cognitive, depression, and falls Referrals and appointments  In addition, I have reviewed and discussed with patient certain preventive protocols, quality metrics, and best practice recommendations. A written personalized care plan for preventive services as well as general preventive health recommendations were provided to patient.     Lebron Conners, LPN   [id number]   Nurse Notes: Pt declined Dexa scan.

## 2022-06-29 DIAGNOSIS — R509 Fever, unspecified: Secondary | ICD-10-CM | POA: Diagnosis not present

## 2022-06-29 DIAGNOSIS — R059 Cough, unspecified: Secondary | ICD-10-CM | POA: Diagnosis not present

## 2022-06-29 DIAGNOSIS — R0981 Nasal congestion: Secondary | ICD-10-CM | POA: Diagnosis not present

## 2022-07-10 ENCOUNTER — Other Ambulatory Visit: Payer: Self-pay | Admitting: Family Medicine

## 2022-07-10 DIAGNOSIS — Z1231 Encounter for screening mammogram for malignant neoplasm of breast: Secondary | ICD-10-CM

## 2022-07-14 DIAGNOSIS — L814 Other melanin hyperpigmentation: Secondary | ICD-10-CM | POA: Diagnosis not present

## 2022-07-14 DIAGNOSIS — D485 Neoplasm of uncertain behavior of skin: Secondary | ICD-10-CM | POA: Diagnosis not present

## 2022-07-14 DIAGNOSIS — D225 Melanocytic nevi of trunk: Secondary | ICD-10-CM | POA: Diagnosis not present

## 2022-07-14 DIAGNOSIS — Z85820 Personal history of malignant melanoma of skin: Secondary | ICD-10-CM | POA: Diagnosis not present

## 2022-07-14 DIAGNOSIS — D2272 Melanocytic nevi of left lower limb, including hip: Secondary | ICD-10-CM | POA: Diagnosis not present

## 2022-07-14 DIAGNOSIS — L821 Other seborrheic keratosis: Secondary | ICD-10-CM | POA: Diagnosis not present

## 2022-07-14 DIAGNOSIS — D2271 Melanocytic nevi of right lower limb, including hip: Secondary | ICD-10-CM | POA: Diagnosis not present

## 2022-07-14 DIAGNOSIS — D1801 Hemangioma of skin and subcutaneous tissue: Secondary | ICD-10-CM | POA: Diagnosis not present

## 2022-07-14 DIAGNOSIS — L281 Prurigo nodularis: Secondary | ICD-10-CM | POA: Diagnosis not present

## 2022-07-14 DIAGNOSIS — D2262 Melanocytic nevi of left upper limb, including shoulder: Secondary | ICD-10-CM | POA: Diagnosis not present

## 2022-07-28 DIAGNOSIS — H18593 Other hereditary corneal dystrophies, bilateral: Secondary | ICD-10-CM | POA: Diagnosis not present

## 2022-07-28 DIAGNOSIS — H25813 Combined forms of age-related cataract, bilateral: Secondary | ICD-10-CM | POA: Diagnosis not present

## 2022-07-28 DIAGNOSIS — H43813 Vitreous degeneration, bilateral: Secondary | ICD-10-CM | POA: Diagnosis not present

## 2022-07-28 DIAGNOSIS — H179 Unspecified corneal scar and opacity: Secondary | ICD-10-CM | POA: Diagnosis not present

## 2022-07-28 DIAGNOSIS — E119 Type 2 diabetes mellitus without complications: Secondary | ICD-10-CM | POA: Diagnosis not present

## 2022-07-28 LAB — HM DIABETES EYE EXAM

## 2022-07-30 ENCOUNTER — Encounter: Payer: Self-pay | Admitting: Family Medicine

## 2022-07-30 ENCOUNTER — Ambulatory Visit (INDEPENDENT_AMBULATORY_CARE_PROVIDER_SITE_OTHER): Payer: PPO | Admitting: Family Medicine

## 2022-07-30 VITALS — BP 128/70 | HR 71 | Temp 97.8°F | Ht 65.0 in | Wt 158.4 lb

## 2022-07-30 DIAGNOSIS — E785 Hyperlipidemia, unspecified: Secondary | ICD-10-CM | POA: Diagnosis not present

## 2022-07-30 DIAGNOSIS — E1169 Type 2 diabetes mellitus with other specified complication: Secondary | ICD-10-CM

## 2022-07-30 DIAGNOSIS — I10 Essential (primary) hypertension: Secondary | ICD-10-CM

## 2022-07-30 DIAGNOSIS — E119 Type 2 diabetes mellitus without complications: Secondary | ICD-10-CM | POA: Diagnosis not present

## 2022-07-30 LAB — POCT GLYCOSYLATED HEMOGLOBIN (HGB A1C): Hemoglobin A1C: 5.8 % — AB (ref 4.0–5.6)

## 2022-07-30 MED ORDER — METFORMIN HCL 500 MG PO TABS: 500.0000 mg | ORAL_TABLET | Freq: Two times a day (BID) | ORAL | 3 refills | Status: DC

## 2022-07-30 NOTE — Patient Instructions (Addendum)
Cut your metformin to 500 mg twice daily  If any problems let us know   Keep walking   Keep watching diet  Try to get most of your carbohydrates from produce (with the exception of white potatoes)  Eat less bread/pasta/rice/snack foods/cereals/sweets and other items from the middle of the grocery store (processed carbs)     Add some more strength training to your routine, this is important for bone and brain health and can reduce your risk of falls and help your body use insulin properly and regulate weight  Light weights, exercise bands , and internet videos are a good way to start  Yoga (chair or regular), machines , floor exercises or a gym with machines are also good options   Keep up the great job !

## 2022-07-30 NOTE — Assessment & Plan Note (Signed)
Lab Results  Component Value Date   HGBA1C 5.8 (A) 07/30/2022   Very well controlled  Plan to cut metformin to 500 mg bid  Watch GFR On arb and statin  Eye exam utd Nl microalb  Nl foot exam  F/u 6 mo  Commended great job with diet and exercise and wt loss

## 2022-07-30 NOTE — Assessment & Plan Note (Signed)
bp in fair control at this time  BP Readings from Last 1 Encounters:  07/30/22 128/70   No changes needed Most recent labs reviewed  Disc lifstyle change with low sodium diet and exercise  Plan to continue losartan 25 mg daily  Will watch GFR

## 2022-07-30 NOTE — Progress Notes (Signed)
Subjective:    Patient ID: Carla Mccoy, female    DOB: [birth year], 72 y.o.   MRN: [id number]  HPI Pt presents for 6 mo f/u of chronic medical problems incl DM2  Wt Readings from Last 3 Encounters:  07/30/22 158 lb 6 oz (71.8 kg)  06/25/22 163 lb (73.9 kg)  05/27/22 168 lb 2 oz (76.3 kg)   26.35 kg/m  Losing weight  She had covid- got over that / metal taste in mouth and takes a while to get taste back so eating  Then started walking more - 10,000 steps per day Using 3 lb weights  Eating better   Vitals:   07/30/22 0821  BP: 128/70  Pulse: 71  Temp: 97.8 F (36.6 C)  SpO2: 98%     HTN bp is stable today  No cp or palpitations or headaches or edema  No side effects to medicines  BP Readings from Last 3 Encounters:  07/30/22 128/70  05/27/22 126/62  01/29/22 134/70    Losartan 25 mg daily   DM2 Lab Results  Component Value Date   HGBA1C 5.8 (A) 07/30/2022  Excellent  Well controlled Metformin 1000 mg bid - wants to cut back  On arb and statin   Cutting portions - eating 1/2 as much  Also less bread  Eating more vegetables   Trying to eat protein for meals   Lab Results  Component Value Date   MICROALBUR <0.7 01/22/2022   MICROALBUR <0.7 11/02/2015        Eye exam utd march   Hyperlipidemia Lab Results  Component Value Date   CHOL 107 01/22/2022   HDL 45.50 01/22/2022   LDLCALC 44 01/22/2022   LDLDIRECT 70.0 05/07/2016   TRIG 89.0 01/22/2022   CHOLHDL 2 01/22/2022    Fenofibrate 54 mg daily  Atorvastatin 5 mg daily   Occ fried okra  Otherwise diet is good   Patient Active Problem List   Diagnosis Date Noted   Acute cystitis 05/27/2022   Intertrigo 01/29/2022   Hearing loss 01/31/2021   Estrogen deficiency 11/03/2017   Osteopenia 11/03/2017   Leg cramps 11/18/2016   Family history of congenital heart disease 11/18/2016   Cough due to ACE inhibitor 11/18/2016   Routine general medical examination at a health care facility  03/26/2015   Colon cancer screening 03/24/2014   Encounter for Medicare annual wellness exam 03/24/2014   Diabetes type 2, controlled ([place]) 03/17/2014   Benign paroxysmal positional vertigo 08/26/2013   OSA (obstructive sleep apnea) 08/10/2013   Chronic mastitis of left breast 07/04/2013   History of breast cancer 02/10/2012   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   BACK PAIN, CHRONIC 05/10/2007   PEPTIC ULCER DISEASE, HX OF 05/10/2007   HYSTERECTOMY, HX OF 05/10/2007   TONSILLECTOMY, HX OF 05/10/2007   Hypothyroidism 01/28/2007   Hyperlipidemia associated with type 2 diabetes mellitus ([place]) 01/28/2007   Overweight 01/28/2007   COMMON MIGRAINE 01/28/2007   Essential hypertension 01/28/2007   FATTY LIVER DISEASE 01/28/2007   CARPAL TUNNEL SYNDROME, HX OF 01/27/2007   Past Medical History:  Diagnosis Date   Allergy    Angioneurotic edema not elsewhere classified    ACE-I   Arthritis    Backache, unspecified    breast ca dx'd 06/2009   breast cancer left - lumpectomy   Diabetes mellitus without complication ([place])    Full dentures    Migraine without aura, without mention of intractable migraine without mention of status migrainosus  Obesity, unspecified    Other abnormal glucose    Other and unspecified hyperlipidemia    Other chronic nonalcoholic liver disease    Personal history of peptic ulcer disease    Personal history of radiation therapy    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 Cornett, MD;  Location: [facility];  Service: General;  Laterality: Left;   INCISION AND DRAINAGE OF WOUND Left 12/21/2013   Procedure: DEBRIDEMENT OF LEFT BREAST WOUND WITH PLACEMENT OF A CELL;  Surgeon: Theodoro Kos, DO;  Location: [facility];  Service: Plastics;  Laterality: Left;   LAPAROSCOPY ABDOMEN DIAGNOSTIC     TONSILLECTOMY     TUBAL LIGATION  1971   Social History   Tobacco Use   Smoking status: Never   Smokeless tobacco: Never  Vaping Use   Vaping Use: Never used  Substance Use Topics   Alcohol use: No    Alcohol/week: 0.0 standard drinks of alcohol   Drug use: No   Family History  Problem Relation Age of Onset   Peripheral vascular disease Mother    Coronary artery disease Mother    Hyperlipidemia Mother    Heart disease Mother        CAD/fatal MI   Diabetes Mother    Stroke Mother    Diabetes Sister    Hypothyroidism Sister    Breast cancer Maternal Grandmother    Diabetes Brother    Hyperlipidemia Brother    Heart disease Brother    Hypertrophic cardiomyopathy Brother        congenital   Colon cancer Neg Hx    Esophageal cancer Neg Hx    Pancreatic cancer Neg Hx    Stomach cancer Neg Hx    Rectal cancer Neg Hx    Allergies  Allergen Reactions   Metoprolol     Lips swelled/ hands swelled and her skin turned red    Sulfonamide Derivatives    Current Outpatient Medications on File Prior to Visit  Medication Sig Dispense Refill   atorvastatin (LIPITOR) 10 MG tablet Take 1/2 (one-half) tablet by mouth once daily 45 tablet 3   Cholecalciferol (VITAMIN D3) 2000 UNITS TABS Take by mouth.     fenofibrate 54 MG tablet Take 1 tablet (54 mg total) by mouth daily. 90 tablet 3   ketoconazole (NIZORAL) 2 % cream Apply 1 Application topically daily. To rash/affected area (Patient taking differently: Apply 1 Application topically daily as needed. To  rash/affected area) 15 g 2   levothyroxine (SYNTHROID) 125 MCG tablet Take 1 tablet (125 mcg total) by mouth daily before breakfast. 90 tablet 3   losartan (COZAAR) 25 MG tablet Take 1 tablet (25 mg total) by mouth daily. 90 tablet 3   Magnesium 400 MG CAPS Take 1 tablet by mouth 3 (three) times a  week.     Multiple Vitamins-Minerals (MULTIVITAMIN,TX-MINERALS) tablet Take 1 tablet by mouth 2 (two) times a week.     Omega-3 Fatty Acids (OMEGA 3 PO) Take 1 capsule by mouth 2 (two) times a week.      vitamin C (ASCORBIC ACID) 500 MG tablet Take 500 mg by mouth daily.     Vitamin E 400 units TABS Take 800 Units by mouth daily.     zinc gluconate 50 MG tablet Take 50 mg by mouth 2 (two) times a week.     No current facility-administered medications on file prior to visit.     Review of Systems  Constitutional:  Negative for activity change, appetite change, fatigue, fever and unexpected weight change.  HENT:  Negative for congestion, ear pain, rhinorrhea, sinus pressure and sore throat.   Eyes:  Negative for pain, redness and visual disturbance.  Respiratory:  Negative for cough, shortness of breath and wheezing.   Cardiovascular:  Negative for chest pain and palpitations.  Gastrointestinal:  Negative for abdominal pain, blood in stool, constipation and diarrhea.  Endocrine: Negative for polydipsia and polyuria.  Genitourinary:  Negative for dysuria, frequency and urgency.  Musculoskeletal:  Negative for arthralgias, back pain and myalgias.  Skin:  Negative for pallor and rash.  Allergic/Immunologic: Negative for environmental allergies.  Neurological:  Negative for dizziness, syncope and headaches.  Hematological:  Negative for adenopathy. Does not bruise/bleed easily.  Psychiatric/Behavioral:  Negative for decreased concentration and dysphoric mood. The patient is not nervous/anxious.        Objective:   Physical Exam Constitutional:      General: She is not in acute distress.     Appearance: Normal appearance. She is well-developed and normal weight. She is not ill-appearing or diaphoretic.  HENT:     Head: Normocephalic and atraumatic.     Mouth/Throat:     Mouth: Mucous membranes are moist.  Eyes:     Conjunctiva/sclera: Conjunctivae normal.     Pupils: Pupils are equal, round, and reactive to light.  Neck:     Thyroid: No thyromegaly.     Vascular: No carotid bruit or JVD.  Cardiovascular:     Rate and Rhythm: Normal rate and regular rhythm.     Heart sounds: Normal heart sounds.     No gallop.  Pulmonary:     Effort: Pulmonary effort is normal. No respiratory distress.     Breath sounds: Normal breath sounds. No wheezing or rales.  Abdominal:     General: There is no distension or abdominal bruit.     Palpations: Abdomen is soft.  Musculoskeletal:     Cervical back: Normal range of motion and neck supple.     Right lower leg: No edema.     Left lower leg: No edema.  Lymphadenopathy:     Cervical: No cervical adenopathy.  Skin:    General: Skin is warm and dry.     Coloration: Skin is not pale.     Findings: No rash.  Neurological:     Mental Status: She is alert.     Coordination: Coordination normal.     Deep Tendon Reflexes: Reflexes are normal and symmetric. Reflexes normal.  Psychiatric:        Mood and Affect: Mood normal.           Assessment & Plan:   Problem List Items Addressed This Visit       Cardiovascular and Mediastinum   Essential hypertension    bp  in fair control at this time  BP Readings from Last 1 Encounters:  07/30/22 128/70  No changes needed Most recent labs reviewed  Disc lifstyle change with low sodium diet and exercise  Plan to continue losartan 25 mg daily  Will watch GFR        Endocrine   Diabetes type 2, controlled ([place]) - Primary    Lab Results  Component Value Date   HGBA1C 5.8 (A) 07/30/2022  Very well controlled  Plan to cut metformin to 500 mg bid  Watch GFR On arb and statin  Eye  exam utd Nl microalb  Nl foot exam  F/u 6 mo  Commended great job with diet and exercise and wt loss       Relevant Medications   metFORMIN (GLUCOPHAGE) 500 MG tablet   Other Relevant Orders   POCT HgB A1C (Completed)   Hyperlipidemia associated with type 2 diabetes mellitus ([place])    Disc goals for lipids and reasons to control them Rev last labs with pt Rev low sat fat diet in detail  LDL at goal- 44 HDL stable Trig down  Plan to continue fenofibrate 54 mg daily and atorvastatin 5 mg daily        Relevant Medications   metFORMIN (GLUCOPHAGE) 500 MG tablet

## 2022-07-30 NOTE — Assessment & Plan Note (Signed)
Disc goals for lipids and reasons to control them Rev last labs with pt Rev low sat fat diet in detail  LDL at goal- 44 HDL stable Trig down  Plan to continue fenofibrate 54 mg daily and atorvastatin 5 mg daily   

## 2022-08-21 ENCOUNTER — Ambulatory Visit
Admission: RE | Admit: 2022-08-21 | Discharge: 2022-08-21 | Disposition: A | Discharge status: Home / Self Care | Payer: PPO | Source: Ambulatory Visit | Attending: Family Medicine | Admitting: Family Medicine

## 2022-08-21 DIAGNOSIS — Z1231 Encounter for screening mammogram for malignant neoplasm of breast: Secondary | ICD-10-CM | POA: Diagnosis not present

## 2022-08-26 DIAGNOSIS — D225 Melanocytic nevi of trunk: Secondary | ICD-10-CM | POA: Diagnosis not present

## 2022-08-26 DIAGNOSIS — Z85820 Personal history of malignant melanoma of skin: Secondary | ICD-10-CM | POA: Diagnosis not present

## 2022-08-26 DIAGNOSIS — D485 Neoplasm of uncertain behavior of skin: Secondary | ICD-10-CM | POA: Diagnosis not present

## 2022-11-11 DIAGNOSIS — D485 Neoplasm of uncertain behavior of skin: Secondary | ICD-10-CM | POA: Diagnosis not present

## 2022-11-11 DIAGNOSIS — Z85820 Personal history of malignant melanoma of skin: Secondary | ICD-10-CM | POA: Diagnosis not present

## 2022-12-18 ENCOUNTER — Encounter (INDEPENDENT_AMBULATORY_CARE_PROVIDER_SITE_OTHER): Payer: Self-pay

## 2023-01-25 ENCOUNTER — Telehealth: Payer: Self-pay | Admitting: Family Medicine

## 2023-01-25 DIAGNOSIS — I10 Essential (primary) hypertension: Secondary | ICD-10-CM

## 2023-01-25 DIAGNOSIS — E119 Type 2 diabetes mellitus without complications: Secondary | ICD-10-CM

## 2023-01-25 DIAGNOSIS — E1169 Type 2 diabetes mellitus with other specified complication: Secondary | ICD-10-CM

## 2023-01-25 DIAGNOSIS — E039 Hypothyroidism, unspecified: Secondary | ICD-10-CM

## 2023-01-25 NOTE — Telephone Encounter (Signed)
-----   Message from Lovena Neighbours sent at 01/08/2023  2:07 PM EDT ----- Regarding: Labs for Monday 9.23.24 Please put physical lab orders in future. Thank you, Denny Peon

## 2023-01-26 ENCOUNTER — Other Ambulatory Visit (INDEPENDENT_AMBULATORY_CARE_PROVIDER_SITE_OTHER): Payer: PPO

## 2023-01-26 DIAGNOSIS — I10 Essential (primary) hypertension: Secondary | ICD-10-CM | POA: Diagnosis not present

## 2023-01-26 DIAGNOSIS — E119 Type 2 diabetes mellitus without complications: Secondary | ICD-10-CM

## 2023-01-26 DIAGNOSIS — E785 Hyperlipidemia, unspecified: Secondary | ICD-10-CM

## 2023-01-26 DIAGNOSIS — E039 Hypothyroidism, unspecified: Secondary | ICD-10-CM

## 2023-01-26 DIAGNOSIS — E1169 Type 2 diabetes mellitus with other specified complication: Secondary | ICD-10-CM

## 2023-01-26 LAB — LIPID PANEL
Cholesterol: 117 mg/dL (ref 0–200)
HDL: 51.8 mg/dL (ref >39.00)
LDL Cholesterol: 45 mg/dL (ref 0–99)
NonHDL: 64.82
Total CHOL/HDL Ratio: 2
Triglycerides: 101 mg/dL (ref 0.0–149.0)
VLDL: 20.2 mg/dL (ref 0.0–40.0)

## 2023-01-26 LAB — CBC WITH DIFFERENTIAL/PLATELET
Basophils Absolute: 0.1 10*3/uL (ref 0.0–0.1)
Basophils Relative: 0.9 % (ref 0.0–3.0)
Eosinophils Absolute: 0.4 10*3/uL (ref 0.0–0.7)
Eosinophils Relative: 6.2 % — ABNORMAL HIGH (ref 0.0–5.0)
HCT: 40.7 % (ref 36.0–46.0)
Hemoglobin: 13.2 g/dL (ref 12.0–15.0)
Lymphocytes Relative: 31.4 % (ref 12.0–46.0)
Lymphs Abs: 1.9 10*3/uL (ref 0.7–4.0)
MCHC: 32.5 g/dL (ref 30.0–36.0)
MCV: 90.7 fl (ref 78.0–100.0)
Monocytes Absolute: 0.4 10*3/uL (ref 0.1–1.0)
Monocytes Relative: 7.1 % (ref 3.0–12.0)
Neutro Abs: 3.3 10*3/uL (ref 1.4–7.7)
Neutrophils Relative %: 54.4 % (ref 43.0–77.0)
Platelets: 363 10*3/uL (ref 150.0–400.0)
RBC: 4.49 Mil/uL (ref 3.87–5.11)
RDW: 13.8 % (ref 11.5–15.5)
WBC: 6 10*3/uL (ref 4.0–10.5)

## 2023-01-26 LAB — COMPREHENSIVE METABOLIC PANEL
ALT: 9 U/L (ref 0–35)
AST: 13 U/L (ref 0–37)
Albumin: 4.2 g/dL (ref 3.5–5.2)
Alkaline Phosphatase: 32 U/L — ABNORMAL LOW (ref 39–117)
BUN: 17 mg/dL (ref 6–23)
CO2: 27 mEq/L (ref 19–32)
Calcium: 9.8 mg/dL (ref 8.4–10.5)
Chloride: 107 mEq/L (ref 96–112)
Creatinine, Ser: 0.95 mg/dL (ref 0.40–1.20)
GFR: 59.23 mL/min — ABNORMAL LOW (ref >60.00)
Glucose, Bld: 97 mg/dL (ref 70–99)
Potassium: 4.8 mEq/L (ref 3.5–5.1)
Sodium: 142 mEq/L (ref 135–145)
Total Bilirubin: 0.5 mg/dL (ref 0.2–1.2)
Total Protein: 6.7 g/dL (ref 6.0–8.3)

## 2023-01-26 LAB — HEMOGLOBIN A1C: Hgb A1c MFr Bld: 5.7 % (ref 4.6–6.5)

## 2023-01-26 LAB — MICROALBUMIN / CREATININE URINE RATIO
Creatinine,U: 114.3 mg/dL
Microalb Creat Ratio: 0.6 mg/g (ref 0.0–30.0)
Microalb, Ur: <0.7 mg/dL (ref 0.0–1.9)

## 2023-01-26 LAB — TSH: TSH: 0.23 u[IU]/mL — ABNORMAL LOW (ref 0.35–5.50)

## 2023-02-02 ENCOUNTER — Ambulatory Visit (INDEPENDENT_AMBULATORY_CARE_PROVIDER_SITE_OTHER): Payer: PPO | Admitting: Family Medicine

## 2023-02-02 ENCOUNTER — Encounter: Payer: Self-pay | Admitting: Family Medicine

## 2023-02-02 VITALS — BP 122/60 | HR 64 | Temp 97.6°F | Ht 65.5 in | Wt 154.4 lb

## 2023-02-02 DIAGNOSIS — Z23 Encounter for immunization: Secondary | ICD-10-CM | POA: Diagnosis not present

## 2023-02-02 DIAGNOSIS — I10 Essential (primary) hypertension: Secondary | ICD-10-CM | POA: Diagnosis not present

## 2023-02-02 DIAGNOSIS — Z0000 Encounter for general adult medical examination without abnormal findings: Secondary | ICD-10-CM | POA: Diagnosis not present

## 2023-02-02 DIAGNOSIS — M79671 Pain in right foot: Secondary | ICD-10-CM | POA: Insufficient documentation

## 2023-02-02 DIAGNOSIS — M8589 Other specified disorders of bone density and structure, multiple sites: Secondary | ICD-10-CM

## 2023-02-02 DIAGNOSIS — E119 Type 2 diabetes mellitus without complications: Secondary | ICD-10-CM | POA: Diagnosis not present

## 2023-02-02 DIAGNOSIS — E039 Hypothyroidism, unspecified: Secondary | ICD-10-CM

## 2023-02-02 DIAGNOSIS — E2839 Other primary ovarian failure: Secondary | ICD-10-CM

## 2023-02-02 DIAGNOSIS — M79672 Pain in left foot: Secondary | ICD-10-CM

## 2023-02-02 DIAGNOSIS — E1169 Type 2 diabetes mellitus with other specified complication: Secondary | ICD-10-CM

## 2023-02-02 DIAGNOSIS — Z7984 Long term (current) use of oral hypoglycemic drugs: Secondary | ICD-10-CM | POA: Diagnosis not present

## 2023-02-02 DIAGNOSIS — Z1211 Encounter for screening for malignant neoplasm of colon: Secondary | ICD-10-CM

## 2023-02-02 DIAGNOSIS — E785 Hyperlipidemia, unspecified: Secondary | ICD-10-CM | POA: Diagnosis not present

## 2023-02-02 MED ORDER — LOSARTAN POTASSIUM 25 MG PO TABS: 25.0000 mg | ORAL_TABLET | Freq: Every day | ORAL | 3 refills | Status: DC

## 2023-02-02 MED ORDER — FENOFIBRATE 54 MG PO TABS: 54.0000 mg | ORAL_TABLET | Freq: Every day | ORAL | 3 refills | Status: DC

## 2023-02-02 MED ORDER — METFORMIN HCL 500 MG PO TABS: 250.0000 mg | ORAL_TABLET | Freq: Two times a day (BID) | ORAL | 3 refills | Status: DC

## 2023-02-02 MED ORDER — ATORVASTATIN CALCIUM 10 MG PO TABS: ORAL_TABLET | ORAL | 3 refills | Status: DC

## 2023-02-02 MED ORDER — LEVOTHYROXINE SODIUM 112 MCG PO TABS: 112.0000 ug | ORAL_TABLET | Freq: Every day | ORAL | 3 refills | Status: DC

## 2023-02-02 NOTE — Progress Notes (Signed)
Subjective:    Patient ID: Carla Mccoy, female    DOB: [birth year], 72 y.o.   MRN: [id number]  HPI  Here for health maintenance exam and to review chronic medical problems   Wt Readings from Last 3 Encounters:  02/02/23 154 lb 6 oz (70 kg)  07/30/22 158 lb 6 oz (71.8 kg)  06/25/22 163 lb (73.9 kg)   25.30 kg/m  Vitals:   02/02/23 0858 02/02/23 0928  BP: (!) 144/76 122/60  Pulse: 64   Temp: 97.6 F (36.4 C)   SpO2: 98%     Immunization History  Administered Date(s) Administered   Fluad Quad(high Dose 65+) 01/17/2019, 01/31/2021, 01/29/2022   Fluad Trivalent(High Dose 65+) 02/02/2023   H1N1 03/06/2008   Influenza Whole 03/06/2008   Influenza, High Dose Seasonal PF 03/28/2016   Influenza,inj,Quad PF,6+ Mos 03/24/2014, 03/26/2015, 03/10/2017   Influenza-Unspecified 02/02/2013, 02/18/2018, 03/08/2020   PFIZER(Purple Top)SARS-COV-2 Vaccination 06/25/2019, 07/19/2019   Pneumococcal Conjugate-13 03/22/2013   Pneumococcal Polysaccharide-23 09/02/2011, 03/10/2017   Td 08/07/2008    Health Maintenance Due  Topic Date Due   Zoster Vaccines- Shingrix (1 of 2) Never done   DTaP/Tdap/Td (2 - Tdap) 08/08/2018   Shingrix-wants to get at pharmacy later   Flu shot -today   Tetanus shot -due at the pharmacy   Mammogram 08/2022  Personal history of breast cancer   Self breast exam- no lumps (has history of mastitis in past)    Gyn health Hysterectomy    Colon cancer screening  colonoscopy 05/2017   Bone health  Dexa  02/2021 osteopenia  Falls- none  Fractures-none  Supplements - calcium twice per week, D every day  Exercise - walking , video Margie Billet)  Walks 4-5 mi per day    Mood    07/30/2022    8:34 AM 06/25/2022    9:01 AM 05/27/2022   10:44 AM 01/31/2021    8:20 AM 04/17/2020    8:51 AM  Depression screen PHQ 2/9  Decreased Interest 0 0 0 0 0  Down, Depressed, Hopeless 0 0 0 0 0  PHQ - 2 Score 0 0 0 0 0  Altered sleeping 0 0 0  0  Tired,  decreased energy 0 0 0  0  Change in appetite 0 0 0  0  Feeling bad or failure about yourself  0 0 0  0  Trouble concentrating 0 0 0  0  Moving slowly or fidgety/restless 0 0 0  0  Suicidal thoughts 0 0 0  0  PHQ-9 Score 0 0 0  0  Difficult doing work/chores Not difficult at all Not difficult at all Not difficult at all  Not difficult at all    HTN bp is stable today  No cp or palpitations or headaches or edema  No side effects to medicines  BP Readings from Last 3 Encounters:  02/02/23 122/60  07/30/22 128/70  05/27/22 126/62    Losartan 25 mg daily   Lab Results  Component Value Date   NA 142 01/26/2023   K 4.8 01/26/2023   CO2 27 01/26/2023   GLUCOSE 97 01/26/2023   BUN 17 01/26/2023   CREATININE 0.95 01/26/2023   CALCIUM 9.8 01/26/2023   GFR 59.23 (L) 01/26/2023   GFRNONAA >60 05/31/2009   Hypothyroidism  Pt has no clinical changes No change in energy level/ hair or skin/ edema and no tremor Lab Results  Component Value Date   TSH 0.23 (L) 01/26/2023    Levothyroxine  125 mcg daily    DM2 Lab Results  Component Value Date   HGBA1C 5.7 01/26/2023   This is stable  Metformin 500 mg bid  Eye exam utd On arb and statin     Lab Results  Component Value Date   MICROALBUR <0.7 01/26/2023   MICROALBUR <0.7 01/22/2022   Hyperlipidemia Lab Results  Component Value Date   CHOL 117 01/26/2023   CHOL 107 01/22/2022   CHOL 124 01/31/2021   Lab Results  Component Value Date   HDL 51.80 01/26/2023   HDL 45.50 01/22/2022   HDL 41.70 01/31/2021   Lab Results  Component Value Date   LDLCALC 45 01/26/2023   LDLCALC 44 01/22/2022   LDLCALC 52 01/31/2021   Lab Results  Component Value Date   TRIG 101.0 01/26/2023   TRIG 89.0 01/22/2022   TRIG 153.0 (H) 01/31/2021   Lab Results  Component Value Date   CHOLHDL 2 01/26/2023   CHOLHDL 2 01/22/2022   CHOLHDL 3 01/31/2021   Lab Results  Component Value Date   LDLDIRECT 70.0 05/07/2016   LDLDIRECT  85.0 11/02/2015   LDLDIRECT 93.0 03/19/2015     Fenofibrate 54 mg daily  Atorvastatin 5 mg daily   Lab Results  Component Value Date   ALT 9 01/26/2023   AST 13 01/26/2023   ALKPHOS 32 (L) 01/26/2023   BILITOT 0.5 01/26/2023    Lab Results  Component Value Date   WBC 6.0 01/26/2023   HGB 13.2 01/26/2023   HCT 40.7 01/26/2023   MCV 90.7 01/26/2023   PLT 363.0 01/26/2023          Patient Active Problem List   Diagnosis Date Noted   Foot pain, bilateral 02/02/2023   Intertrigo 01/29/2022   Hearing loss 01/31/2021   Estrogen deficiency 11/03/2017   Osteopenia 11/03/2017   Leg cramps 11/18/2016   Family history of congenital heart disease 11/18/2016   Cough due to ACE inhibitor 11/18/2016   Routine general medical examination at a health care facility 03/26/2015   Colon cancer screening 03/24/2014   Encounter for Medicare annual wellness exam 03/24/2014   Diabetes type 2, controlled (HCC) 03/17/2014   Benign paroxysmal positional vertigo 08/26/2013   OSA (obstructive sleep apnea) 08/10/2013   Chronic mastitis of left breast 07/04/2013   History of breast cancer 02/10/2012   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   BACK PAIN, CHRONIC 05/10/2007   PEPTIC ULCER DISEASE, HX OF 05/10/2007   HYSTERECTOMY, HX OF 05/10/2007   TONSILLECTOMY, HX OF 05/10/2007   Hypothyroidism 01/28/2007   Hyperlipidemia associated with type 2 diabetes mellitus (HCC) 01/28/2007   COMMON MIGRAINE 01/28/2007   Essential hypertension 01/28/2007   FATTY LIVER DISEASE 01/28/2007   CARPAL TUNNEL SYNDROME, HX OF 01/27/2007   Past Medical History:  Diagnosis Date   Allergy    Angioneurotic edema not elsewhere classified    ACE-I   Arthritis    Backache, unspecified    breast ca dx'd 06/2009   breast cancer left - lumpectomy   Diabetes mellitus without complication (HCC)    Full dentures    Migraine without aura, without mention of intractable migraine without mention of status migrainosus     Obesity, unspecified    Other abnormal glucose    Other and unspecified hyperlipidemia    Other chronic nonalcoholic liver disease    Personal history of peptic ulcer disease    Personal history of radiation therapy    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 Cornett, MD;  Location: [facility];  Service: General;  Laterality: Left;   INCISION AND DRAINAGE OF WOUND Left 12/21/2013   Procedure: DEBRIDEMENT OF LEFT BREAST WOUND WITH PLACEMENT OF A CELL;  Surgeon: Wayland Denis, DO;  Location: [facility];  Service: Plastics;  Laterality: Left;   LAPAROSCOPY ABDOMEN DIAGNOSTIC     TONSILLECTOMY     TUBAL LIGATION  1971   Social History   Tobacco Use   Smoking status: Never   Smokeless tobacco: Never  Vaping Use   Vaping status: Never Used  Substance Use Topics   Alcohol use: No    Alcohol/week: 0.0 standard drinks of alcohol   Drug use: No   Family History  Problem Relation Age of Onset   Peripheral vascular disease Mother    Coronary artery disease Mother    Hyperlipidemia Mother    Heart disease Mother        CAD/fatal MI   Diabetes Mother    Stroke Mother    Diabetes Sister    Hypothyroidism Sister    Breast cancer Maternal Grandmother    Diabetes Brother    Hyperlipidemia Brother    Heart disease Brother    Hypertrophic cardiomyopathy Brother        congenital   Colon  cancer Neg Hx    Esophageal cancer Neg Hx    Pancreatic cancer Neg Hx    Stomach cancer Neg Hx    Rectal cancer Neg Hx    Allergies  Allergen Reactions   Metoprolol     Lips swelled/ hands swelled and her skin turned red    Sulfonamide Derivatives    Current Outpatient Medications on File Prior to Visit  Medication Sig Dispense Refill   Cholecalciferol (VITAMIN D3) 2000 UNITS TABS Take by mouth.     ketoconazole (NIZORAL) 2 % cream Apply 1 Application topically daily. To rash/affected area (Patient taking differently: Apply 1 Application topically daily as needed. To rash/affected area) 15 g 2   Magnesium 400 MG CAPS Take 1 tablet by mouth 3 (three) times a week.     Multiple Vitamins-Minerals (MULTIVITAMIN,TX-MINERALS) tablet Take 1 tablet by mouth 2 (two) times a week.     Omega-3 Fatty Acids (OMEGA 3 PO) Take 1 capsule by mouth 2 (two) times a week.      vitamin C (ASCORBIC ACID) 500 MG tablet Take 500 mg by mouth daily.     Vitamin E 400 units TABS Take 800 Units by mouth daily.     zinc gluconate 50 MG tablet Take 50 mg by mouth 2 (two) times a week.     No current facility-administered medications on file prior to visit.    Review of Systems  Constitutional:  Negative for activity change, appetite change, fatigue, fever and unexpected weight change.  HENT:  Negative for congestion,  ear pain, rhinorrhea, sinus pressure and sore throat.   Eyes:  Negative for pain, redness and visual disturbance.  Respiratory:  Negative for cough, shortness of breath and wheezing.   Cardiovascular:  Negative for chest pain and palpitations.  Gastrointestinal:  Negative for abdominal pain, blood in stool, constipation and diarrhea.  Endocrine: Negative for polydipsia and polyuria.  Genitourinary:  Negative for dysuria, frequency and urgency.  Musculoskeletal:  Negative for arthralgias, back pain and myalgias.  Skin:  Negative for pallor and rash.  Allergic/Immunologic: Negative for  environmental allergies.  Neurological:  Negative for dizziness, syncope and headaches.  Hematological:  Negative for adenopathy. Does not bruise/bleed easily.  Psychiatric/Behavioral:  Negative for decreased concentration and dysphoric mood. The patient is not nervous/anxious.        Objective:   Physical Exam Constitutional:      General: She is not in acute distress.    Appearance: Normal appearance. She is well-developed and normal weight. She is not ill-appearing or diaphoretic.  HENT:     Head: Normocephalic and atraumatic.     Right Ear: Tympanic membrane, ear canal and external ear normal.     Left Ear: Tympanic membrane, ear canal and external ear normal.     Nose: Nose normal. No congestion.     Mouth/Throat:     Mouth: Mucous membranes are moist.     Pharynx: Oropharynx is clear. No posterior oropharyngeal erythema.  Eyes:     General: No scleral icterus.    Extraocular Movements: Extraocular movements intact.     Conjunctiva/sclera: Conjunctivae normal.     Pupils: Pupils are equal, round, and reactive to light.  Neck:     Thyroid: No thyromegaly.     Vascular: No carotid bruit or JVD.  Cardiovascular:     Rate and Rhythm: Normal rate and regular rhythm.     Pulses: Normal pulses.     Heart sounds: Normal heart sounds.     No gallop.  Pulmonary:     Effort: Pulmonary effort is normal. No respiratory distress.     Breath sounds: Normal breath sounds. No wheezing.     Comments: Good air exch Chest:     Chest wall: No tenderness.  Abdominal:     General: Bowel sounds are normal. There is no distension or abdominal bruit.     Palpations: Abdomen is soft. There is no mass.     Tenderness: There is no abdominal tenderness.     Hernia: No hernia is present.  Genitourinary:    Comments: Breast exam: No mass, nodules, thickening, tenderness, bulging, retraction, inflamation, nipple discharge or skin changes noted.  No axillary or clavicular LA.     Baseline surgical  changes in left breast from prior cancer No erythema  Musculoskeletal:        General: No tenderness. Normal range of motion.     Cervical back: Normal range of motion and neck supple. No rigidity. No muscular tenderness.     Right lower leg: No edema.     Left lower leg: No edema.     Comments: No kyphosis   Lymphadenopathy:     Cervical: No cervical adenopathy.  Skin:    General: Skin is warm and dry.     Coloration: Skin is not pale.     Findings: No erythema or rash.  Neurological:     Mental Status: She is alert. Mental status is at baseline.     Cranial Nerves: No cranial nerve deficit.  Motor: No abnormal muscle tone.     Coordination: Coordination normal.     Gait: Gait normal.     Deep Tendon Reflexes: Reflexes are normal and symmetric. Reflexes normal.  Psychiatric:        Mood and Affect: Mood normal.        Cognition and Memory: Cognition and memory normal.           Assessment & Plan:   Problem List Items Addressed This Visit       Cardiovascular and Mediastinum   Essential hypertension    bp in fair control at this time  BP Readings from Last 1 Encounters:  02/02/23 122/60   No changes needed Most recent labs reviewed  Disc lifstyle change with low sodium diet and exercise  Plan to continue losartan 25 mg daily        Relevant Medications   atorvastatin (LIPITOR) 10 MG tablet   fenofibrate 54 MG tablet   losartan (COZAAR) 25 MG tablet     Endocrine   Diabetes type 2, controlled (HCC)    Lab Results  Component Value Date   HGBA1C 5.7 01/26/2023   Well controlled Will dec metforin to 250 mg bid  Eye exam utd On arb and statin  Microalb utd  Encouraged low glycemic diet        Relevant Medications   metFORMIN (GLUCOPHAGE) 500 MG tablet   atorvastatin (LIPITOR) 10 MG tablet   losartan (COZAAR) 25 MG tablet   Hyperlipidemia associated with type 2 diabetes mellitus (HCC)    Disc goals for lipids and reasons to control them Rev last  labs with pt Rev low sat fat diet in detail  LDL of 45 Well controlled  Continues atorvastatin 5 mg daily  Also fenofibrate 54 mg daily       Relevant Medications   metFORMIN (GLUCOPHAGE) 500 MG tablet   atorvastatin (LIPITOR) 10 MG tablet   fenofibrate 54 MG tablet   losartan (COZAAR) 25 MG tablet   Hypothyroidism    Lab Results  Component Value Date   TSH 0.23 (L) 01/26/2023   Will decrease dose of levothyroxine to 112 mcg daily  Re check 6 wk No clinical changes Taking med correctly       Relevant Medications   levothyroxine (SYNTHROID) 112 MCG tablet   Other Relevant Orders   TSH     Musculoskeletal and Integument   Osteopenia    Due for dexa next mo-ordered No falls/fracture Discussed fall prevention, supplements and exercise for bone density          Other   Colon cancer screening    Colonoscopy 05/2017       Estrogen deficiency    Dexa ordered       Relevant Orders   DG Bone Density   Foot pain, bilateral    Aching  Pt thinks she needs more support /lost padding in feet  Considering podiatrist Will call if she needs referral       Routine general medical examination at a health care facility - Primary    Reviewed health habits including diet and exercise and skin cancer prevention Reviewed appropriate screening tests for age  Also reviewed health mt list, fam hx and immunization status , as well as social and family history   See HPI Labs reviewed and ordered Considering shingrix later at pharmacy  Also due for Td Flu shot given today  Colonoscopy utd 05/2017  PHQ 0  Other Visit Diagnoses     Need for influenza vaccination       Relevant Orders   Flu Vaccine Trivalent High Dose (Fluad) (Completed)

## 2023-02-02 NOTE — Assessment & Plan Note (Signed)
Reviewed health habits including diet and exercise and skin cancer prevention Reviewed appropriate screening tests for age  Also reviewed health mt list, fam hx and immunization status , as well as social and family history   See HPI Labs reviewed and ordered Considering shingrix later at pharmacy  Also due for Td Flu shot given today  Colonoscopy utd 05/2017  PHQ 0

## 2023-02-02 NOTE — Assessment & Plan Note (Signed)
Lab Results  Component Value Date   TSH 0.23 (L) 01/26/2023   Will decrease dose of levothyroxine to 112 mcg daily  Re check 6 wk No clinical changes Taking med correctly

## 2023-02-02 NOTE — Assessment & Plan Note (Signed)
Lab Results  Component Value Date   HGBA1C 5.7 01/26/2023   Well controlled Will dec metforin to 250 mg bid  Eye exam utd On arb and statin  Microalb utd  Encouraged low glycemic diet

## 2023-02-02 NOTE — Assessment & Plan Note (Signed)
Colonoscopy 05/2017

## 2023-02-02 NOTE — Assessment & Plan Note (Signed)
bp in fair control at this time  BP Readings from Last 1 Encounters:  02/02/23 122/60   No changes needed Most recent labs reviewed  Disc lifstyle change with low sodium diet and exercise  Plan to continue losartan 25 mg daily

## 2023-02-02 NOTE — Patient Instructions (Addendum)
Flu shot today   Get your shingrix later in the pharmacy   You are also due for a tetanus shot also -check at the pharmacy  Flu shot today    Add some strength training to your routine, this is important for bone and brain health and can reduce your risk of falls and help your body use insulin properly and regulate weight  Light weights, exercise bands , and internet videos are a good way to start  Yoga (chair or regular), machines , floor exercises or a gym with machines are also good options    Go down to 112 for levothyroxine  We will re check in 6 weeks   Go down on metformin to 250 mg twice daily    Schedule your bone density   You have an order for:  []   2D Mammogram  []   3D Mammogram  [x]   Bone Density     Please call for appointment:   []   [facility]  [address] [place] [address]  [phone]  []   [facility])   [address]  [place], [place] [address]  [phone]  [x]   [facility]       [address] [place], [place]        [phone]         []   [facility]  [address] [place], [place]  [phone]  []  [facility]   [address]   [place], [place] [address]  [phone]  []  [facility]  [address] [place], [place] [address] [phone]    Make sure to wear two piece clothing  No lotions powders or deodorants the day of the appointment Make sure to bring picture ID and insurance card.  Bring list of medications you are currently taking including any supplements.   Schedule your screening mammogram through MyChart!   Select [place] imaging sites can now be scheduled through MyChart.  Log into your MyChart account.  Go to 'Visit' (or 'Appointments' if  on mobile  App) --> Schedule an  Appointment  Under 'Select a Reason for Visit' choose the Mammogram  Screening option.  Complete the pre-visit questions  and select the time and place that  best fits your schedule

## 2023-02-02 NOTE — Assessment & Plan Note (Signed)
Disc goals for lipids and reasons to control them Rev last labs with pt Rev low sat fat diet in detail  LDL of 45 Well controlled  Continues atorvastatin 5 mg daily  Also fenofibrate 54 mg daily

## 2023-02-02 NOTE — Assessment & Plan Note (Signed)
Due for dexa next mo-ordered No falls/fracture Discussed fall prevention, supplements and exercise for bone density

## 2023-02-02 NOTE — Assessment & Plan Note (Signed)
Aching  Pt thinks she needs more support /lost padding in feet  Considering podiatrist Will call if she needs referral

## 2023-02-02 NOTE — Assessment & Plan Note (Signed)
Dexa ordered

## 2023-02-27 ENCOUNTER — Ambulatory Visit
Admission: RE | Admit: 2023-02-27 | Discharge: 2023-02-27 | Disposition: A | Discharge status: Home / Self Care | Payer: PPO | Source: Ambulatory Visit | Attending: Family Medicine | Admitting: Family Medicine

## 2023-02-27 DIAGNOSIS — M8588 Other specified disorders of bone density and structure, other site: Secondary | ICD-10-CM | POA: Diagnosis not present

## 2023-02-27 DIAGNOSIS — E2839 Other primary ovarian failure: Secondary | ICD-10-CM

## 2023-02-27 DIAGNOSIS — N958 Other specified menopausal and perimenopausal disorders: Secondary | ICD-10-CM | POA: Diagnosis not present

## 2023-02-27 DIAGNOSIS — E349 Endocrine disorder, unspecified: Secondary | ICD-10-CM | POA: Diagnosis not present

## 2023-02-27 DIAGNOSIS — Z853 Personal history of malignant neoplasm of breast: Secondary | ICD-10-CM | POA: Diagnosis not present

## 2023-03-12 ENCOUNTER — Other Ambulatory Visit: Payer: Self-pay | Admitting: Family Medicine

## 2023-03-12 NOTE — Telephone Encounter (Signed)
Last filled on 01/29/22 #15 g with 2 refills, CPE was 02/02/23

## 2023-03-17 ENCOUNTER — Other Ambulatory Visit (INDEPENDENT_AMBULATORY_CARE_PROVIDER_SITE_OTHER): Payer: PPO

## 2023-03-17 ENCOUNTER — Other Ambulatory Visit: Payer: PPO

## 2023-03-17 DIAGNOSIS — E039 Hypothyroidism, unspecified: Secondary | ICD-10-CM | POA: Diagnosis not present

## 2023-03-17 LAB — TSH: TSH: 0.49 u[IU]/mL (ref 0.35–5.50)

## 2023-03-19 ENCOUNTER — Encounter: Payer: Self-pay | Admitting: *Deleted

## 2023-06-30 ENCOUNTER — Ambulatory Visit (INDEPENDENT_AMBULATORY_CARE_PROVIDER_SITE_OTHER): Payer: PPO

## 2023-06-30 VITALS — Ht 65.5 in | Wt 154.0 lb

## 2023-06-30 DIAGNOSIS — Z0000 Encounter for general adult medical examination without abnormal findings: Secondary | ICD-10-CM

## 2023-06-30 NOTE — Progress Notes (Signed)
 Subjective:   Carla Mccoy is a 73 y.o. who presents for a Medicare Wellness preventive visit.  Visit Complete: Virtual I connected with  Carla Mccoy on 06/30/23 by a audio enabled telemedicine application and verified that I am speaking with the correct person using two identifiers.  Patient Location: Home  Provider Location: Home Office  I discussed the limitations of evaluation and management by telemedicine. The patient expressed understanding and agreed to proceed.  Vital Signs: Because this visit was a virtual/telehealth visit, some criteria may be missing or patient reported. Any vitals not documented were not able to be obtained and vitals that have been documented are patient reported.  VideoDeclined- This patient declined Librarian, academic. Therefore the visit was completed with audio only.  AWV Questionnaire: No: Patient Medicare AWV questionnaire was not completed prior to this visit.  Cardiac Risk Factors include: advanced age (>35men, >50 women);diabetes mellitus;dyslipidemia;hypertension     Objective:    Today's Vitals   06/30/23 1132  Weight: 154 lb (69.9 kg)  Height: 5' 5.5" (1.664 m)   Body mass index is 25.24 kg/m.     06/30/2023   11:53 AM 06/25/2022    9:06 AM 10/19/2017   10:39 AM 06/26/2016   10:12 AM 09/11/2015    8:05 PM 12/19/2013   10:47 AM 09/12/2013   11:25 AM  Advanced Directives  Does Patient Have a Medical Advance Directive? No No No No No No Patient would like information  Copy of Healthcare Power of Attorney in Chart?      No - copy requested   Would patient like information on creating a medical advance directive?  No - Patient declined No - Patient declined No - Patient declined Yes - Educational materials given No - patient declined information Advance directive packet given    Current Medications (verified) Outpatient Encounter Medications as of 06/30/2023  Medication Sig   atorvastatin (LIPITOR) 10  MG tablet Take 1/2 (one-half) tablet by mouth once daily   Cholecalciferol (VITAMIN D3) 2000 UNITS TABS Take by mouth.   fenofibrate 54 MG tablet Take 1 tablet (54 mg total) by mouth daily.   ketoconazole (NIZORAL) 2 % cream Apply 1 Application topically daily as needed. To rash/affected area   levothyroxine (SYNTHROID) 112 MCG tablet Take 1 tablet (112 mcg total) by mouth daily.   losartan (COZAAR) 25 MG tablet Take 1 tablet (25 mg total) by mouth daily.   Magnesium 400 MG CAPS Take 1 tablet by mouth 3 (three) times a week.   metFORMIN (GLUCOPHAGE) 500 MG tablet Take 0.5 tablets (250 mg total) by mouth 2 (two) times daily with a meal.   Multiple Vitamins-Minerals (MULTIVITAMIN,TX-MINERALS) tablet Take 1 tablet by mouth 2 (two) times a week.   Omega-3 Fatty Acids (OMEGA 3 PO) Take 1 capsule by mouth 2 (two) times a week.    vitamin C (ASCORBIC ACID) 500 MG tablet Take 500 mg by mouth daily.   Vitamin E 400 units TABS Take 800 Units by mouth daily.   zinc gluconate 50 MG tablet Take 50 mg by mouth 2 (two) times a week.   No facility-administered encounter medications on file as of 06/30/2023.    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    ACE-I   Arthritis    Backache, unspecified    breast ca dx'd 06/2009   breast cancer  left - lumpectomy   Diabetes mellitus without complication (HCC)    Full dentures    Migraine without aura, without mention of intractable migraine without mention of status migrainosus    Obesity, unspecified    Other abnormal glucose    Other and unspecified hyperlipidemia    Other chronic nonalcoholic liver disease    Personal history of peptic ulcer disease    Personal history of radiation therapy    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 Cornett, MD;  Location: [facility];  Service: General;  Laterality: Left;   INCISION AND DRAINAGE OF WOUND Left 12/21/2013   Procedure: DEBRIDEMENT OF LEFT BREAST WOUND WITH PLACEMENT OF A CELL;  Surgeon: Wayland Denis, DO;  Location: [facility];  Service: Plastics;  Laterality: Left;   LAPAROSCOPY ABDOMEN DIAGNOSTIC     TONSILLECTOMY     TUBAL LIGATION  1971   Family History  Problem Relation Age of Onset   Peripheral vascular disease Mother    Coronary artery disease Mother    Hyperlipidemia Mother    Heart disease Mother        CAD/fatal MI   Diabetes Mother    Stroke Mother    Diabetes Sister    Hypothyroidism Sister    Breast cancer Maternal Grandmother    Diabetes Brother    Hyperlipidemia Brother    Heart disease Brother    Hypertrophic cardiomyopathy Brother        congenital   Colon cancer Neg Hx    Esophageal cancer Neg Hx    Pancreatic cancer Neg Hx    Stomach cancer Neg Hx    Rectal cancer Neg Hx    Social History   Socioeconomic History   Marital status: Married    Spouse name: Not on file   Number of children: Not on file   Years of education: 14   Highest education level: Not on file  Occupational History   Occupation: Administrator, arts: [employer]  Tobacco Use   Smoking status: Never   Smokeless tobacco: Never  Vaping Use   Vaping status: Never Used  Substance and Sexual Activity   Alcohol use: No    Alcohol/week:  0.0 standard drinks of alcohol   Drug use: No   Sexual activity: Yes    Partners: Male  Other Topics Concern   Not on file  Social History Narrative   HSG, [employer] - 3 yrs, no degree. Married -'39  1 son -'93, 1 daughter-'70; 2 grandchildren.  Work - Patent examiner for [employer].  Marriage is in good health. She enjoys her work.                        Social Drivers of Dispensing optician  Resource Strain: Low Risk  (06/30/2023)   Overall Financial Resource Strain (CARDIA)    Difficulty of Paying Living Expenses: Not hard at all  Food Insecurity: No Food Insecurity (06/30/2023)   Hunger Vital Sign    Worried About Running Out of Food in the Last Year: Never true    Ran Out of Food in the Last Year: Never true  Transportation Needs: No Transportation Needs (06/30/2023)   PRAPARE - Administrator, Civil Service (Medical): No    Lack of Transportation (Non-Medical): No  Physical Activity: Sufficiently Active (06/30/2023)   Exercise Vital Sign    Days of Exercise per Week: 6 days    Minutes of Exercise per Session: 40 min  Stress: No Stress Concern Present (06/30/2023)   [employer] of Occupational Health - Occupational Stress Questionnaire    Feeling of Stress : Only a little  Social Connections: Socially Integrated (06/30/2023)   Social Connection and Isolation Panel [NHANES]    Frequency of Communication with Friends and Family: More than three times a week    Frequency of Social Gatherings with Friends and Family: Twice a week    Attends Religious Services: More than 4 times per year    Active Member of [employer] or Organizations: Yes    Attends Banker Meetings: Never    Marital Status: Married    Tobacco Counseling Counseling given: Not Answered  Clinical Intake:  Pre-visit preparation completed: Yes  Pain : No/denies pain    BMI - recorded: 25.24 Nutritional Status: BMI 25 -29 Overweight Nutritional Risks: None Diabetes: Yes CBG  done?: No Did pt. bring in CBG monitor from home?: No  How often do you need to have someone help you when you read instructions, pamphlets, or other written materials from your doctor or pharmacy?: 1 - Never  Interpreter Needed?: No  Comments: lives with husband Information entered by :: B.Rasheen Schewe,LPN  Activities of Daily Living     06/30/2023   11:54 AM  In your present state of health, do you have any difficulty performing the following activities:  Hearing? 0  Vision? 0  Difficulty concentrating or making decisions? 0  Walking or climbing stairs? 0  Dressing or bathing? 0  Doing errands, shopping? 0  Preparing Food and eating ? N  Using the Toilet? N  In the past six months, have you accidently leaked urine? N  Do you have problems with loss of bowel control? N  Managing your Medications? N  Managing your Finances? N  Housekeeping or managing your Housekeeping? N    Patient Care Team: [name], Audrie Gallus, MD as PCP - General (Family Medicine) Romine, Edwena Felty, MD (Obstetrics and Gynecology) Daleen Squibb, Jesse Sans, MD (Cardiology) Love, Genene Churn, MD (Neurology) Mardella Layman, MD (Gastroenterology) Magrinat, Valentino Hue, MD (Inactive) (Hematology and Oncology) Harriette Bouillon, MD (General Surgery) Estrella Deeds, OD (Optometry) Ernesto Rutherford, MD (Ophthalmology) Kathyrn Sheriff, [facility] (Inactive) as Pharmacist (Pharmacist)  Indicate any recent Medical Services you may have received from other than [facility] providers in the past year (date may be approximate).     Assessment:   This is a routine wellness examination for Carla Mccoy.  Hearing/Vision screen Hearing Screening - Comments:: Pt says hearing is good with hearing aids;wears in crowds Vision Screening - Comments:: Pt vision is good w/glasses Yearly appts in march w/Dr R. Dione Booze   Goals Addressed             This Visit's Progress    Increase  physical activity   On track    06/30/23-, I will continue to exercise for 30  min 5-6 days per week.      Lifestyle Change-Diabetes   On track    Timeframe:  Long-Range Goal Priority:  High Start Date:   06/11/2020                          Expected End Date:   none                    Follow Up Date 12/10/2020 9:30 AM   - Exercise at least 5 days a week, with a goal of 150 minutes weekly    Why is this important?   The changes that you are asked to make may be hard to do.  This is especially true when the changes are life-long.  Knowing why it is important to you is the first step.  Working on the change with your family or support person helps you not feel alone.  Reward yourself and family or support person when goals are met. This can be an activity you choose like bowling, hiking, biking, swimming or shooting hoops.     Notes: Activities include Carla Mccoy and walking 1/2 mile around house     Patient Stated   On track    06/30/23- I will continue to take medications as prescribed.       COMPLETED: Patient Stated   On track    Get to 160lbs and 5.9 A1c     Pharmacy Care Plan   On track    CARE PLAN ENTRY (see longitudinal plan of care for additional care plan information)  Current Barriers:  Chronic Disease Management support, education, and care coordination needs related to Hypertension, Hyperlipidemia, and Diabetes   Hypertension BP Readings from Last 3 Encounters:  07/22/19 132/70  01/17/19 129/78  05/10/18 128/72   Pharmacist Clinical Goal(s): Over the next 90 days, patient will work with PharmD and providers to maintain BP goal <130/80 Current regimen:  Losartan 25 mg daily Interventions: Recommended begin checking blood pressure twice weekly. Discussed potential of increasing losartan if home readings remain above goal.  Patient self care activities - Over the next 90 days, patient will: Check BP twice weekly , document, and provide at future appointments Ensure daily salt intake < 2300 mg/day  Hyperlipidemia Lab Results   Component Value Date/Time   LDLCALC 54 07/22/2019 08:56 AM   LDLDIRECT 70.0 05/07/2016 08:04 AM   Pharmacist Clinical Goal(s): Over the next 90 days, patient will work with PharmD and providers to maintain LDL goal < 70 Current regimen:  Atorvastatin 5 mg daily Fenofibrate 54 mg daily Omega 3 twice weekly Interventions: Recommend continuing active lifestyle and avoiding fried or fatty foods for optimal cholesterol management.  Patient self care activities - Over the next 90 days, patient will: Continue to take medications as prescribed.   Diabetes Lab Results  Component Value Date/Time   HGBA1C 7.1 (H) 07/22/2019 08:56 AM   HGBA1C 7.2 (H) 01/11/2019 08:15 AM   Pharmacist Clinical Goal(s): Over the next 90 days, patient will work with PharmD and providers to achieve A1c goal <7% Current regimen:  Metformin 500 mg twice daily Interventions: Discussed patient's personal preference to get A1c back down below 7. Patient feels that being at home more during 2020 caused her A1c to be higher than normal. She is due for a follow-up visit in September.  Patient self care activities - Over the next 90 days, patient will: Continue to take medications as prescribed.   Medication management Pharmacist Clinical Goal(s): Over the next 90 days, patient will work with PharmD and providers to maintain optimal medication adherence Current pharmacy: [facility]  Interventions Comprehensive medication review performed. Continue current medication management strategy' Recommended patient get updated tetanus shot and consider Shingrix vaccine.  Patient self care activities - Over the next 90 days, patient will: Focus on medication adherence by continuing to use a pill box.  Take medications as prescribed Report any questions or concerns to PharmD and/or provider(s)  Initial goal documentation        Depression Screen     06/30/2023   11:48 AM 07/30/2022    8:34 AM 06/25/2022    9:01 AM  05/27/2022   10:44 AM 01/31/2021    8:20 AM 04/17/2020    8:51 AM 01/17/2019    8:24 AM  PHQ 2/9 Scores  PHQ - 2 Score 0 0 0 0 0 0 0  PHQ- 9 Score  0 0 0  0     Fall Risk     06/30/2023   11:42 AM 07/30/2022    8:34 AM 06/25/2022    9:08 AM 05/27/2022   10:44 AM 01/31/2021    8:20 AM  Fall Risk   Falls in the past year? 0 0 0 0 0  Number falls in past yr: 0 0 0 0   Injury with Fall? 0 0 0 0   Risk for fall due to : No Fall Risks No Fall Risks No Fall Risks No Fall Risks   Follow up Education provided;Falls prevention discussed Falls evaluation completed Falls prevention discussed;Falls evaluation completed Falls evaluation completed     MEDICARE RISK AT HOME:  Medicare Risk at Home Any stairs in or around the home?: Yes If so, are there any without handrails?: Yes Home free of loose throw rugs in walkways, pet beds, electrical cords, etc?: Yes Adequate lighting in your home to reduce risk of falls?: Yes Life alert?: No Use of a cane, walker or w/c?: No Grab bars in the bathroom?: Yes (does not use) Shower chair or bench in shower?: Yes (does not use) Elevated toilet seat or a handicapped toilet?: Yes  TIMED UP AND GO:  Was the test performed?  No  Cognitive Function: 6CIT completed    10/19/2017   10:38 AM 06/26/2016   10:08 AM  MMSE - Mini Mental State Exam  Orientation to time 5 5  Orientation to Place 5 5  Registration 3 3  Attention/ Calculation 0 0  Recall 3 3  Language- name 2 objects 0 0  Language- repeat 1 1  Language- follow 3 step command 3 3  Language- read & follow direction 0 0  Write a sentence 0 0  Copy design 0 0  Total score 20 20        06/30/2023   11:55 AM 06/25/2022    9:23 AM  6CIT Screen  What Year? 0 points 0 points  What month? 0 points 0 points  What time? 0 points 0 points  Count back from 20 0 points 0 points  Months in reverse 0 points 0 points  Repeat phrase 0 points 0 points  Total Score 0 points 0 points     Immunizations Immunization History  Administered Date(s) Administered   Fluad Quad(high Dose 65+) 01/17/2019, 01/31/2021, 01/29/2022   Fluad Trivalent(High Dose 65+) 02/02/2023  H1N1 03/06/2008   Influenza Whole 03/06/2008   Influenza, High Dose Seasonal PF 03/28/2016   Influenza,inj,Quad PF,6+ Mos 03/24/2014, 03/26/2015, 03/10/2017   Influenza-Unspecified 02/02/2013, 02/18/2018, 03/08/2020   PFIZER(Purple Top)SARS-COV-2 Vaccination 06/25/2019, 07/19/2019   Pneumococcal Conjugate-13 03/22/2013   Pneumococcal Polysaccharide-23 09/02/2011, 03/10/2017   Td 08/07/2008   Zoster Recombinant(Shingrix) 05/07/2023    Screening Tests Health Maintenance  Topic Date Due   DTaP/Tdap/Td (2 - Tdap) 08/08/2018   COVID-19 Vaccine (3 - 2024-25 season) 02/17/2025 (Originally 01/04/2023)   Zoster Vaccines- Shingrix (2 of 2) 07/02/2023   HEMOGLOBIN A1C  07/26/2023   OPHTHALMOLOGY EXAM  07/28/2023   Diabetic kidney evaluation - eGFR measurement  01/26/2024   Diabetic kidney evaluation - Urine ACR  01/26/2024   FOOT EXAM  02/02/2024   Medicare Annual Wellness (AWV)  06/29/2024   MAMMOGRAM  08/20/2024   Colonoscopy  05/15/2027   Pneumonia Vaccine 26+ Years old  Completed   INFLUENZA VACCINE  Completed   DEXA SCAN  Completed   Hepatitis C Screening  Completed   HPV VACCINES  Aged Out    Health Maintenance  Health Maintenance Due  Topic Date Due   DTaP/Tdap/Td (2 - Tdap) 08/08/2018   Health Maintenance Items Addressed: none needed   Additional Screening:  Vision Screening: Recommended annual ophthalmology exams for early detection of glaucoma and other disorders of the eye.  Dental Screening: Recommended annual dental exams for proper oral hygiene  Community Resource Referral / Chronic Care Management: CRR required this visit?  No   CCM required this visit?  Appt scheduled with PCP for PE and Lab visit     Plan:     I have personally reviewed and noted the following in the  patient's chart:   Medical and social history Use of alcohol, tobacco or illicit drugs  Current medications and supplements including opioid prescriptions. Patient is not currently taking opioid prescriptions. Functional ability and status Nutritional status Physical activity Advanced directives List of other physicians Hospitalizations, surgeries, and ER visits in previous 12 months Vitals Screenings to include cognitive, depression, and falls Referrals and appointments  In addition, I have reviewed and discussed with patient certain preventive protocols, quality metrics, and best practice recommendations. A written personalized care plan for preventive services as well as general preventive health recommendations were provided to patient.    ALYRIA KRACK, LPN   06/04/8655   After Visit Summary: (MyChart) Due to this being a telephonic visit, the after visit summary with patients personalized plan was offered to patient via MyChart   Notes: Nothing significant to report at this time.

## 2023-06-30 NOTE — Patient Instructions (Signed)
 Ms. Carla Mccoy , Thank you for taking time to come for your Medicare Wellness Visit. I appreciate your ongoing commitment to your health goals. Please review the following plan we discussed and let me know if I can assist you in the future.   Referrals/Orders/Follow-Ups/Clinician Recommendations: none  This is a list of the screening recommended for you and due dates:  Health Maintenance  Topic Date Due   DTaP/Tdap/Td vaccine (2 - Tdap) 08/08/2018   COVID-19 Vaccine (3 - 2024-25 season) 02/17/2025*   Zoster (Shingles) Vaccine (2 of 2) 07/02/2023   Hemoglobin A1C  07/26/2023   Eye exam for diabetics  07/28/2023   Yearly kidney function blood test for diabetes  01/26/2024   Yearly kidney health urinalysis for diabetes  01/26/2024   Complete foot exam   02/02/2024   Medicare Annual Wellness Visit  06/29/2024   Mammogram  08/20/2024   Colon Cancer Screening  05/15/2027   Pneumonia Vaccine  Completed   Flu Shot  Completed   DEXA scan (bone density measurement)  Completed   Hepatitis C Screening  Completed   HPV Vaccine  Aged Out  *Topic was postponed. The date shown is not the original due date.    Advanced directives: (Declined) Advance directive discussed with you today. Even though you declined this today, please call our office should you change your mind, and we can give you the proper paperwork for you to fill out.  Next Medicare Annual Wellness Visit scheduled for next year: Yes 06/30/24/ @ 11:30am televisit

## 2023-07-08 ENCOUNTER — Other Ambulatory Visit: Payer: Self-pay | Admitting: Family Medicine

## 2023-07-08 DIAGNOSIS — Z1231 Encounter for screening mammogram for malignant neoplasm of breast: Secondary | ICD-10-CM

## 2023-08-03 ENCOUNTER — Encounter: Payer: Self-pay | Admitting: Family Medicine

## 2023-08-03 DIAGNOSIS — H179 Unspecified corneal scar and opacity: Secondary | ICD-10-CM | POA: Diagnosis not present

## 2023-08-03 DIAGNOSIS — H18593 Other hereditary corneal dystrophies, bilateral: Secondary | ICD-10-CM | POA: Diagnosis not present

## 2023-08-03 DIAGNOSIS — H25813 Combined forms of age-related cataract, bilateral: Secondary | ICD-10-CM | POA: Diagnosis not present

## 2023-08-03 DIAGNOSIS — E119 Type 2 diabetes mellitus without complications: Secondary | ICD-10-CM | POA: Diagnosis not present

## 2023-08-03 DIAGNOSIS — H43813 Vitreous degeneration, bilateral: Secondary | ICD-10-CM | POA: Diagnosis not present

## 2023-08-03 LAB — HM DIABETES EYE EXAM

## 2023-08-27 ENCOUNTER — Ambulatory Visit
Admission: RE | Admit: 2023-08-27 | Discharge: 2023-08-27 | Disposition: A | Discharge status: Home / Self Care | Source: Ambulatory Visit | Attending: Family Medicine | Admitting: Family Medicine

## 2023-08-27 DIAGNOSIS — Z1231 Encounter for screening mammogram for malignant neoplasm of breast: Secondary | ICD-10-CM

## 2023-08-31 ENCOUNTER — Encounter: Payer: Self-pay | Admitting: Family Medicine

## 2023-09-23 DIAGNOSIS — D485 Neoplasm of uncertain behavior of skin: Secondary | ICD-10-CM | POA: Diagnosis not present

## 2023-09-23 DIAGNOSIS — D225 Melanocytic nevi of trunk: Secondary | ICD-10-CM | POA: Diagnosis not present

## 2023-09-23 DIAGNOSIS — D2271 Melanocytic nevi of right lower limb, including hip: Secondary | ICD-10-CM | POA: Diagnosis not present

## 2023-09-23 DIAGNOSIS — L738 Other specified follicular disorders: Secondary | ICD-10-CM | POA: Diagnosis not present

## 2023-09-23 DIAGNOSIS — L821 Other seborrheic keratosis: Secondary | ICD-10-CM | POA: Diagnosis not present

## 2023-09-23 DIAGNOSIS — D2261 Melanocytic nevi of right upper limb, including shoulder: Secondary | ICD-10-CM | POA: Diagnosis not present

## 2023-09-23 DIAGNOSIS — L853 Xerosis cutis: Secondary | ICD-10-CM | POA: Diagnosis not present

## 2023-09-23 DIAGNOSIS — D2262 Melanocytic nevi of left upper limb, including shoulder: Secondary | ICD-10-CM | POA: Diagnosis not present

## 2023-09-23 DIAGNOSIS — Z85820 Personal history of malignant melanoma of skin: Secondary | ICD-10-CM | POA: Diagnosis not present

## 2023-09-23 DIAGNOSIS — L720 Epidermal cyst: Secondary | ICD-10-CM | POA: Diagnosis not present

## 2023-09-24 IMAGING — MG MM DIGITAL SCREENING BILAT W/ TOMO AND CAD
8 series · 8 of 24 positions shown · non-contrast
Comparison: Previous exam(s).

CLINICAL DATA: Screening.

EXAM:
DIGITAL SCREENING BILATERAL MAMMOGRAM WITH TOMOSYNTHESIS AND CAD
TECHNIQUE: Bilateral screening digital craniocaudal and mediolateral oblique
mammograms were obtained. Bilateral screening digital breast
tomosynthesis was performed. The images were evaluated with
computer-aided detection.

[R CC synth-2D]
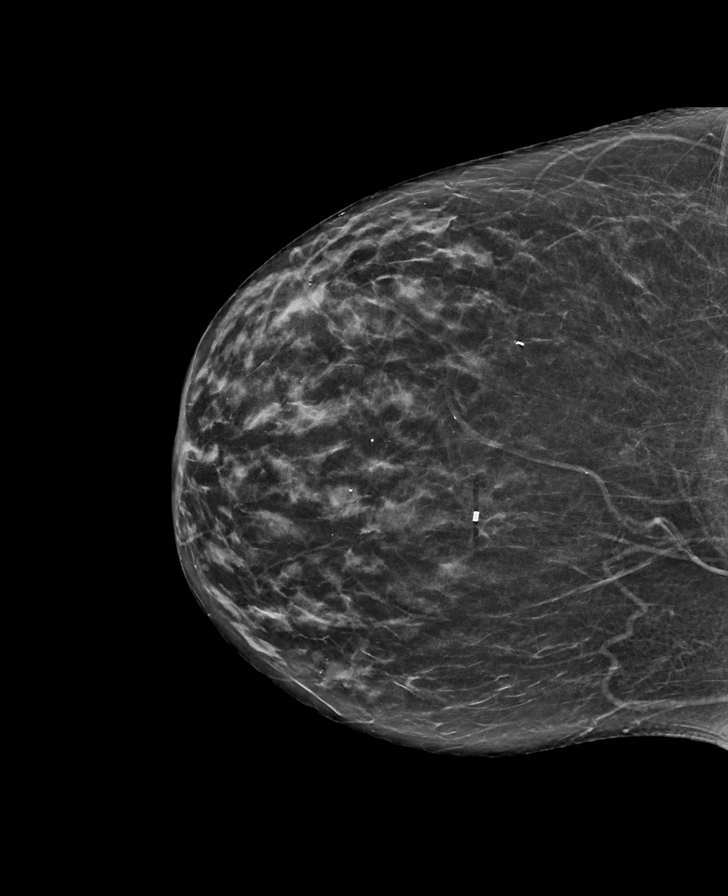

[L MLO synth-2D]
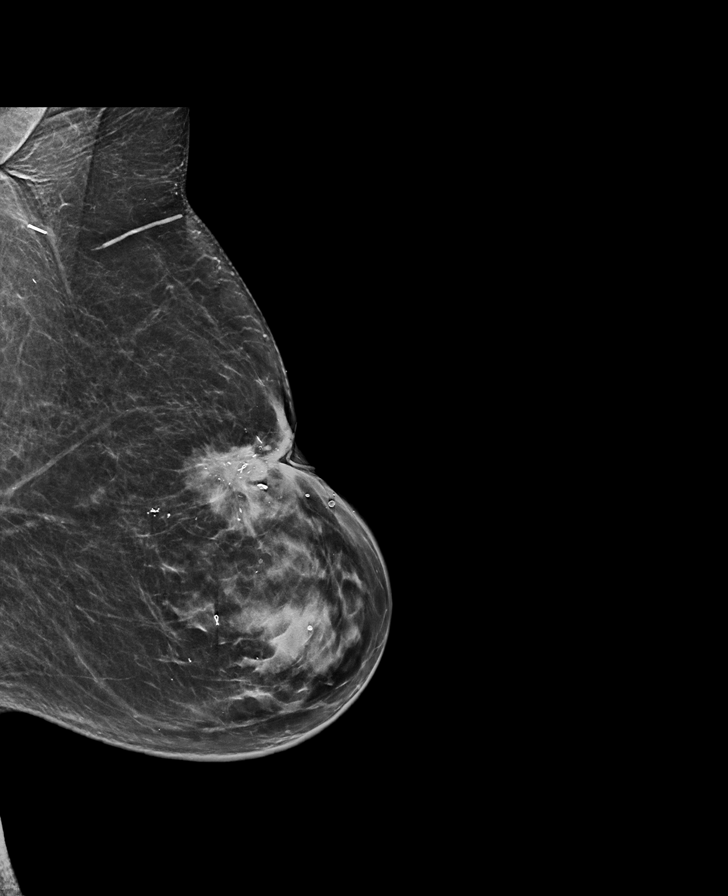

[R MLO synth-2D]
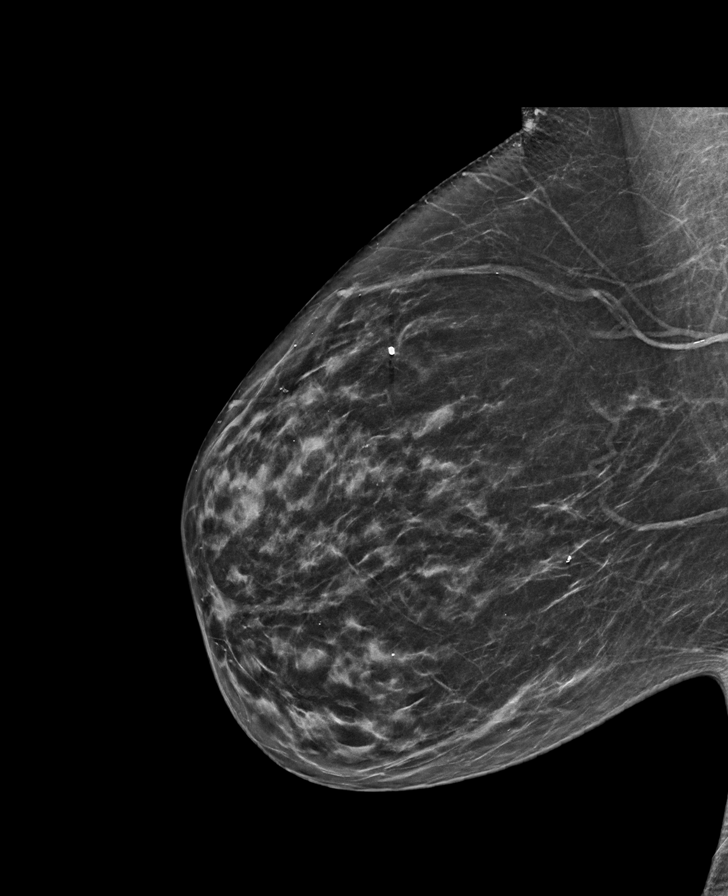

[L CC synth-2D]
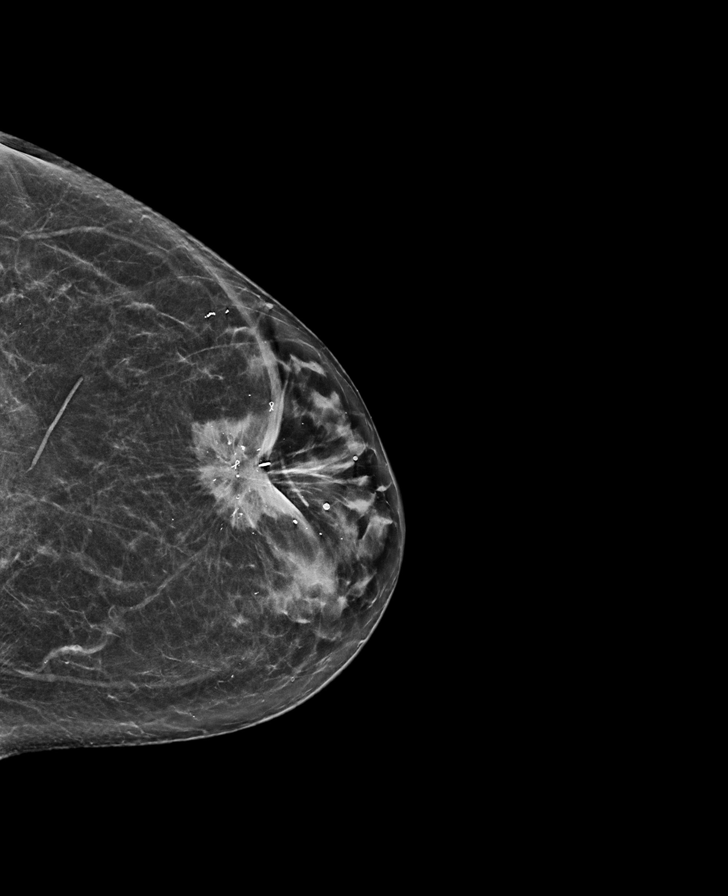

[R CC tomo · tomo slice 29/58.0]
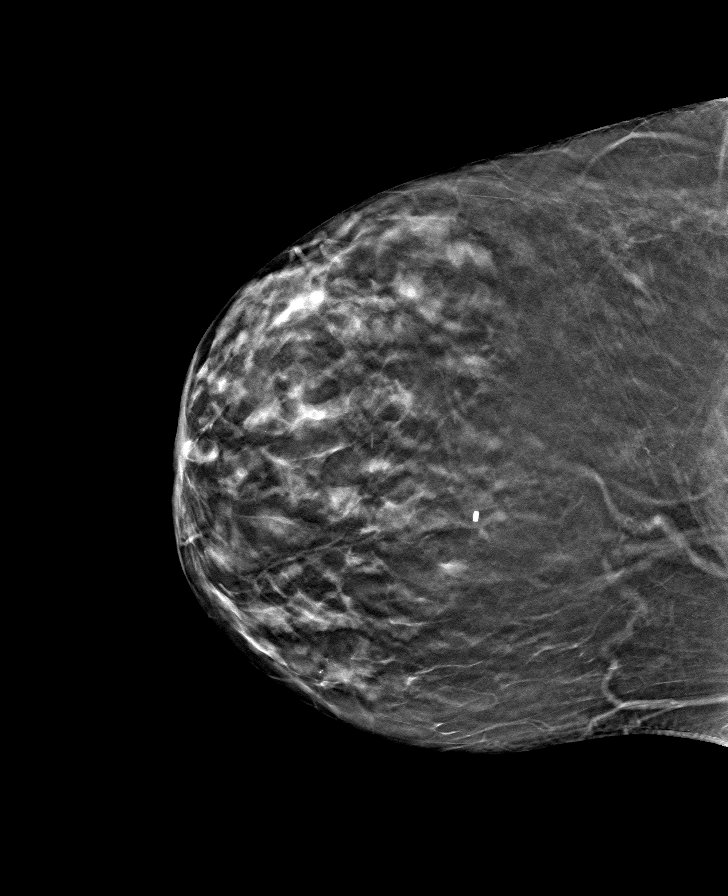

[R MLO tomo · tomo slice 32/63.0]
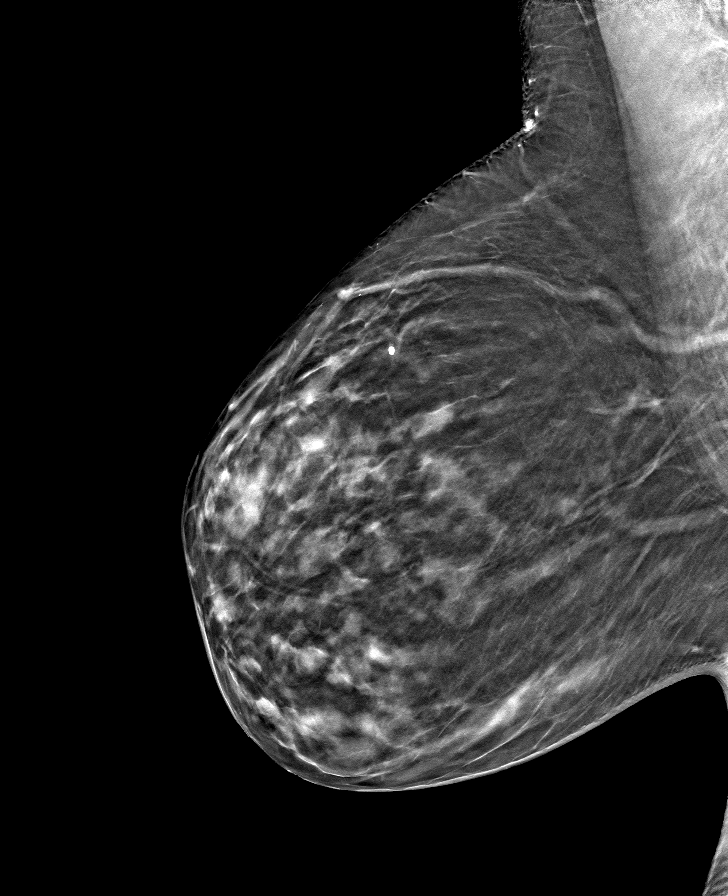

[L CC tomo · tomo slice 33/64.0]
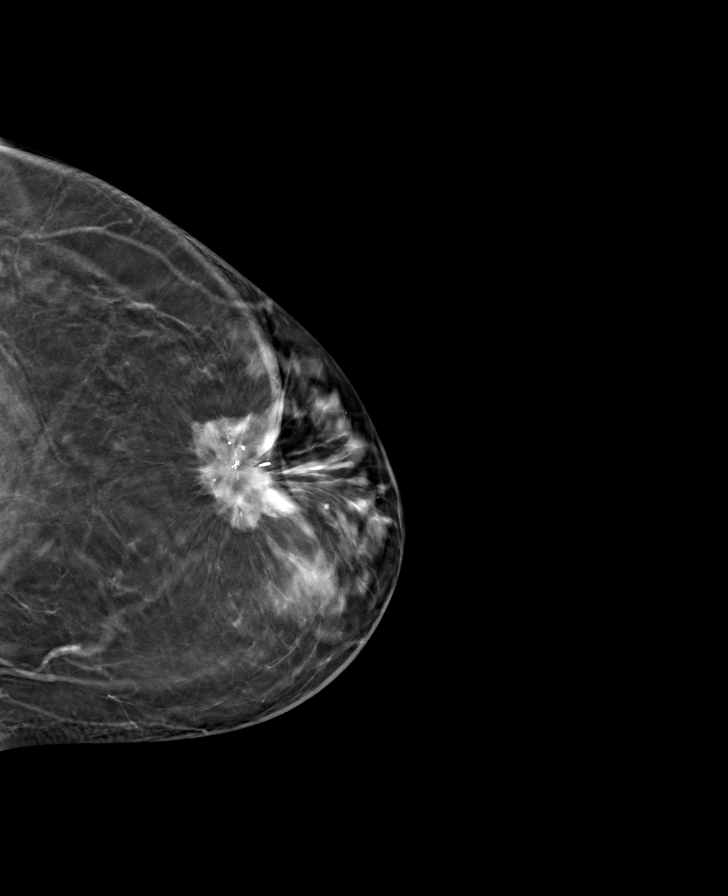

[L MLO tomo · tomo slice 35/68.0]
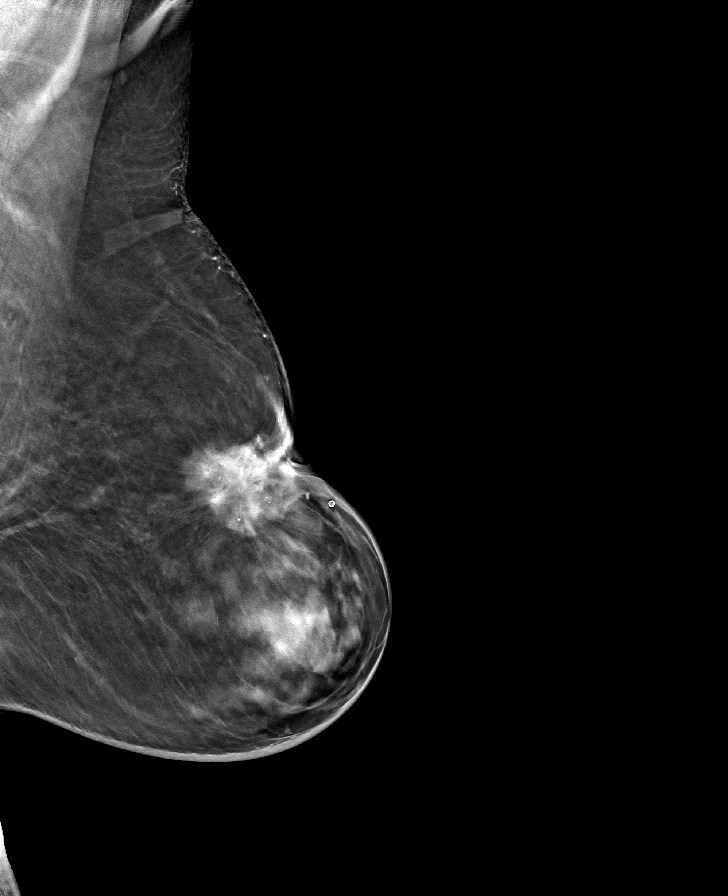

[8 of 24 positions shown; findings below may reference images not displayed]

ACR Breast Density Category c: The breast tissue is heterogeneously
dense, which may obscure small masses.
FINDINGS: There are no findings suspicious for malignancy.
IMPRESSION: No mammographic evidence of malignancy. A result letter of this
screening mammogram will be mailed directly to the patient.

RECOMMENDATION:
Screening mammogram in one year. (Code:Q3-W-BC3)

BI-RADS CATEGORY  1: Negative.

## 2023-10-09 ENCOUNTER — Telehealth: Payer: Self-pay | Admitting: Family Medicine

## 2023-10-09 NOTE — Telephone Encounter (Signed)
 Under Health Maintenance it is the A1c that's due. Please see Dr. Myriam Ashing message and schedule DM f/u, will do lab same day as appt

## 2023-10-09 NOTE — Telephone Encounter (Signed)
 Does she meant HB A1c ? -more likely  6 month diabetic follow up Please schedule visit- will talk and check A1c then

## 2023-10-09 NOTE — Telephone Encounter (Signed)
 Copied from CRM [id number]. Topic: Appointments - Scheduling Inquiry for Clinic >> Oct 09, 2023 11:24 AM [employer] wrote: Reason for CRM: Patient calling because she sees a note on her MyChart account stating that her Hemoglobin is overdue and that it should be checked every 6 months. Patient would like a call back to verify if she needs to come in for Hemoglobin check before her next appointment which is on 01/28/2024.

## 2023-10-12 NOTE — Telephone Encounter (Signed)
 called and schedule appt for pt

## 2023-10-20 ENCOUNTER — Ambulatory Visit (INDEPENDENT_AMBULATORY_CARE_PROVIDER_SITE_OTHER): Admitting: Family Medicine

## 2023-10-20 ENCOUNTER — Encounter: Payer: Self-pay | Admitting: Family Medicine

## 2023-10-20 ENCOUNTER — Ambulatory Visit (INDEPENDENT_AMBULATORY_CARE_PROVIDER_SITE_OTHER)
Admission: RE | Admit: 2023-10-20 | Discharge: 2023-10-20 | Disposition: A | Discharge status: Home / Self Care | Source: Ambulatory Visit | Attending: Family Medicine | Admitting: Family Medicine

## 2023-10-20 ENCOUNTER — Ambulatory Visit: Payer: Self-pay | Admitting: Family Medicine

## 2023-10-20 VITALS — BP 144/72 | HR 66 | Temp 97.9°F | Ht 65.5 in | Wt 172.2 lb

## 2023-10-20 DIAGNOSIS — F430 Acute stress reaction: Secondary | ICD-10-CM

## 2023-10-20 DIAGNOSIS — R1010 Upper abdominal pain, unspecified: Secondary | ICD-10-CM

## 2023-10-20 DIAGNOSIS — R059 Cough, unspecified: Secondary | ICD-10-CM | POA: Insufficient documentation

## 2023-10-20 DIAGNOSIS — E119 Type 2 diabetes mellitus without complications: Secondary | ICD-10-CM

## 2023-10-20 DIAGNOSIS — R052 Subacute cough: Secondary | ICD-10-CM

## 2023-10-20 DIAGNOSIS — R058 Other specified cough: Secondary | ICD-10-CM | POA: Diagnosis not present

## 2023-10-20 DIAGNOSIS — Z7984 Long term (current) use of oral hypoglycemic drugs: Secondary | ICD-10-CM

## 2023-10-20 DIAGNOSIS — E785 Hyperlipidemia, unspecified: Secondary | ICD-10-CM | POA: Diagnosis not present

## 2023-10-20 DIAGNOSIS — E1169 Type 2 diabetes mellitus with other specified complication: Secondary | ICD-10-CM

## 2023-10-20 DIAGNOSIS — I10 Essential (primary) hypertension: Secondary | ICD-10-CM | POA: Diagnosis not present

## 2023-10-20 DIAGNOSIS — R0989 Other specified symptoms and signs involving the circulatory and respiratory systems: Secondary | ICD-10-CM | POA: Diagnosis not present

## 2023-10-20 DIAGNOSIS — R079 Chest pain, unspecified: Secondary | ICD-10-CM | POA: Diagnosis not present

## 2023-10-20 DIAGNOSIS — C50919 Malignant neoplasm of unspecified site of unspecified female breast: Secondary | ICD-10-CM | POA: Diagnosis not present

## 2023-10-20 LAB — BASIC METABOLIC PANEL WITH GFR
BUN: 15 mg/dL (ref 6–23)
CO2: 29 meq/L (ref 19–32)
Calcium: 9.8 mg/dL (ref 8.4–10.5)
Chloride: 105 meq/L (ref 96–112)
Creatinine, Ser: 0.99 mg/dL (ref 0.40–1.20)
GFR: 56.08 mL/min — ABNORMAL LOW (ref >60.00)
Glucose, Bld: 109 mg/dL — ABNORMAL HIGH (ref 70–99)
Potassium: 4.8 meq/L (ref 3.5–5.1)
Sodium: 139 meq/L (ref 135–145)

## 2023-10-20 LAB — CBC WITH DIFFERENTIAL/PLATELET
Basophils Absolute: 0.1 10*3/uL (ref 0.0–0.1)
Basophils Relative: 1.1 % (ref 0.0–3.0)
Eosinophils Absolute: 0.4 10*3/uL (ref 0.0–0.7)
Eosinophils Relative: 4.9 % (ref 0.0–5.0)
HCT: 40.4 % (ref 36.0–46.0)
Hemoglobin: 13.4 g/dL (ref 12.0–15.0)
Lymphocytes Relative: 28.3 % (ref 12.0–46.0)
Lymphs Abs: 2.1 10*3/uL (ref 0.7–4.0)
MCHC: 33.1 g/dL (ref 30.0–36.0)
MCV: 89.7 fl (ref 78.0–100.0)
Monocytes Absolute: 0.6 10*3/uL (ref 0.1–1.0)
Monocytes Relative: 7.8 % (ref 3.0–12.0)
Neutro Abs: 4.2 10*3/uL (ref 1.4–7.7)
Neutrophils Relative %: 57.9 % (ref 43.0–77.0)
Platelets: 348 10*3/uL (ref 150.0–400.0)
RBC: 4.5 Mil/uL (ref 3.87–5.11)
RDW: 13.5 % (ref 11.5–15.5)
WBC: 7.3 10*3/uL (ref 4.0–10.5)

## 2023-10-20 LAB — HEPATIC FUNCTION PANEL
ALT: 12 U/L (ref 0–35)
AST: 17 U/L (ref 0–37)
Albumin: 4.3 g/dL (ref 3.5–5.2)
Alkaline Phosphatase: 35 U/L — ABNORMAL LOW (ref 39–117)
Bilirubin, Direct: 0.1 mg/dL (ref 0.0–0.3)
Total Bilirubin: 0.5 mg/dL (ref 0.2–1.2)
Total Protein: 7 g/dL (ref 6.0–8.3)

## 2023-10-20 LAB — POCT GLYCOSYLATED HEMOGLOBIN (HGB A1C): Hemoglobin A1C: 6.3 % — AB (ref 4.0–5.6)

## 2023-10-20 LAB — LIPASE: Lipase: 45 U/L (ref 11.0–59.0)

## 2023-10-20 NOTE — Assessment & Plan Note (Signed)
 With emotional eating and weight gain  Daughter with breast cancer Some deaths in family   Reviewed stressors/ coping techniques/symptoms/ support sources/ tx options and side effects in detail today Declines counseling but will follow up here in 2 wk

## 2023-10-20 NOTE — Assessment & Plan Note (Signed)
 bp in fair control at this time  BP Readings from Last 1 Encounters:  10/20/23 (!) 144/72   Continues losartan  25 mg daily but forgot to take med this am so blood pressure is up  Follow up planned 2 wk  Lab today Most recent labs reviewed  Disc lifstyle change with low sodium diet and exercise

## 2023-10-20 NOTE — Assessment & Plan Note (Signed)
Disc goals for lipids and reasons to control them Rev last labs with pt Rev low sat fat diet in detail  LDL of 45 Well controlled  Continues atorvastatin 5 mg daily  Also fenofibrate 54 mg daily

## 2023-10-20 NOTE — Progress Notes (Signed)
 Subjective:    Patient ID: Carla Mccoy, female    DOB: Apr 01, 1951, 73 y.o.   MRN: [id number]  HPI  Wt Readings from Last 3 Encounters:  10/20/23 172 lb 4 oz (78.1 kg)  06/30/23 154 lb (69.9 kg)  02/02/23 154 lb 6 oz (70 kg)   28.23 kg/m  Vitals:   10/20/23 1023  BP: (!) 144/72  Pulse: 66  Temp: 97.9 F (36.6 C)  SpO2: 99%    Pt presents for follow up of DM2 and HTN, lipids and chronic medical problems   Pt has been out of her regular habits - since daughter was diagnosed with breast cancer  BIL and aunt died recently also  Not taking care of herself  Weight is up significantly  Is an emotional eater   Also foot pain   Also side pain (bilateral when she twists or turns) -worse on left  Noted fatty liver    Sinus issues with cough recently   HTN bp is stable today  No cp or palpitations or headaches or edema  No side effects to medicines  BP Readings from Last 3 Encounters:  10/20/23 (!) 144/72  02/02/23 122/60  07/30/22 128/70    Forgot to take her medicines this am   Losartan  25 mg daily  Blood pressure is up a bit (just missed it today)    Lab Results  Component Value Date   NA 139 10/20/2023   K 4.8 10/20/2023   CO2 29 10/20/2023   GLUCOSE 109 (H) 10/20/2023   BUN 15 10/20/2023   CREATININE 0.99 10/20/2023   CALCIUM  9.8 10/20/2023   GFR 56.08 (L) 10/20/2023   GFRNONAA >60 05/31/2009   Diabetes Home sugar results -not checked  DM diet -not great   Exercise - still exercising  Walking Working in garden  Needs to do some strength training  Used to do the [employer] program-not lately  Just got exercise bands out    Eye exam - 07/2023  Lab Results  Component Value Date   HGBA1C 6.3 (A) 10/20/2023   HGBA1C 5.7 01/26/2023   HGBA1C 5.8 (A) 07/30/2022   Today 6.3   Lab Results  Component Value Date   MICROALBUR <0.7 01/26/2023   MICROALBUR <0.7 01/22/2022   Metformin  250 mg bid     Hyperlipidemia Lab Results   Component Value Date   CHOL 117 01/26/2023   HDL 51.80 01/26/2023   LDLCALC 45 01/26/2023   LDLDIRECT 70.0 05/07/2016   TRIG 101.0 01/26/2023   CHOLHDL 2 01/26/2023   Atorvastatin  5 mg daily  Fenofibrate  54 mg daily   History of fatty liver dz  Lab Results  Component Value Date   ALT 12 10/20/2023   AST 17 10/20/2023   ALKPHOS 35 (L) 10/20/2023   BILITOT 0.5 10/20/2023    Fibrosis 4 Score = .88 Score is based on outdated labs. ALT, AST, and platelets should all be measured within the last 6 months for an accurate FIB-4 Score  Fib-4 interpretation is not validated for people under 35 or over 44 years of age. However, scores under 2.0 are generally considered low risk.    No problems with medications  Renal protection-arb   Chest xray DG Chest 2 View Result Date: 10/20/2023 CLINICAL DATA:  morning cough (mildly productive) also pain in bilateral lower ribs with twisting and turning remote h/o breast cancer EXAM: CHEST - 2 VIEW COMPARISON:  Oct 02, 2017 FINDINGS: Low lung volumes with bronchovascular crowding. No  focal airspace consolidation, pleural effusion, or pneumothorax. No cardiomegaly. No acute fracture or destructive lesion. Surgical clips again noted. IMPRESSION: No acute cardiopulmonary abnormality. Electronically Signed   By: Rance Burrows M.D.   On: 10/20/2023 13:46        06/30/2023   11:48 AM 07/30/2022    8:34 AM 06/25/2022    9:01 AM 05/27/2022   10:44 AM 01/31/2021    8:20 AM  Depression screen PHQ 2/9  Decreased Interest 0 0 0 0 0  Down, Depressed, Hopeless 0 0 0 0 0  PHQ - 2 Score 0 0 0 0 0  Altered sleeping  0 0 0   Tired, decreased energy  0 0 0   Change in appetite  0 0 0   Feeling bad or failure about yourself   0 0 0   Trouble concentrating  0 0 0   Moving slowly or fidgety/restless  0 0 0   Suicidal thoughts  0 0 0   PHQ-9 Score  0 0 0   Difficult doing work/chores  Not difficult at all Not difficult at all Not difficult at all        07/30/2022    8:34 AM 05/27/2022   10:44 AM  GAD 7 : Generalized Anxiety Score  Nervous, Anxious, on Edge 0 0  Control/stop worrying 0 0  Worry too much - different things 0 1  Trouble relaxing 0 0  Restless 0 0  Easily annoyed or irritable 0 0  Afraid - awful might happen 0 0  Total GAD 7 Score 0 1  Anxiety Difficulty Not difficult at all Not difficult at all        Patient Active Problem List   Diagnosis Date Noted   Cough 10/20/2023   Stress reaction 10/20/2023   Foot pain, bilateral 02/02/2023   Intertrigo 01/29/2022   Hearing loss 01/31/2021   Upper abdominal pain 04/17/2020   Estrogen deficiency 11/03/2017   Osteopenia 11/03/2017   Leg cramps 11/18/2016   Family history of congenital heart disease 11/18/2016   Cough due to ACE inhibitor 11/18/2016   Routine general medical examination at a health care facility 03/26/2015   Colon cancer screening 03/24/2014   Encounter for Medicare annual wellness exam 03/24/2014   Diabetes type 2, controlled (HCC) 03/17/2014   Benign paroxysmal positional vertigo 08/26/2013   OSA (obstructive sleep apnea) 08/10/2013   Chronic mastitis of left breast 07/04/2013   History of breast cancer 02/10/2012   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   BACK PAIN, CHRONIC 05/10/2007   PEPTIC ULCER DISEASE, HX OF 05/10/2007   HYSTERECTOMY, HX OF 05/10/2007   TONSILLECTOMY, HX OF 05/10/2007   Hypothyroidism 01/28/2007   Hyperlipidemia associated with type 2 diabetes mellitus (HCC) 01/28/2007   COMMON MIGRAINE 01/28/2007   Essential hypertension 01/28/2007   FATTY LIVER DISEASE 01/28/2007   CARPAL TUNNEL SYNDROME, HX OF 01/27/2007   Past Medical History:  Diagnosis Date   Allergy    Angioneurotic edema not elsewhere classified    ACE-I   Arthritis    Backache, unspecified    breast ca dx'd 06/2009   breast cancer left - lumpectomy   Diabetes mellitus without complication (HCC)    Full dentures    Migraine without aura, without mention of  intractable migraine without mention of status migrainosus    Obesity, unspecified    Other abnormal glucose    Other and unspecified hyperlipidemia    Other chronic nonalcoholic liver disease    Personal history of  peptic ulcer disease    Personal history of radiation therapy    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 Cornett, MD;  Location: [facility];  Service: General;  Laterality: Left;   INCISION AND DRAINAGE OF WOUND Left 12/21/2013   Procedure: DEBRIDEMENT OF LEFT BREAST WOUND WITH PLACEMENT OF A CELL;  Surgeon: Marilou Showman, DO;  Location: [facility];  Service: Plastics;  Laterality: Left;   LAPAROSCOPY ABDOMEN DIAGNOSTIC     TONSILLECTOMY     TUBAL LIGATION  1971   Social History   Tobacco Use   Smoking status: Never   Smokeless tobacco: Never  Vaping Use   Vaping status: Never Used  Substance Use Topics   Alcohol use: No    Alcohol/week: 0.0 standard drinks of alcohol   Drug use: No   Family History  Problem Relation Age of Onset   Peripheral vascular disease Mother    Coronary artery disease Mother    Hyperlipidemia Mother    Heart disease Mother        CAD/fatal MI   Diabetes Mother    Stroke Mother    Diabetes Sister    Hypothyroidism Sister    Diabetes Brother    Hyperlipidemia Brother    Heart disease Brother    Hypertrophic cardiomyopathy Brother         congenital   Breast cancer Maternal Grandmother    Breast cancer Daughter    Colon cancer Neg Hx    Esophageal cancer Neg Hx    Pancreatic cancer Neg Hx    Stomach cancer Neg Hx    Rectal cancer Neg Hx    Allergies  Allergen Reactions   Metoprolol     Lips swelled/ hands swelled and her skin turned red    Sulfonamide Derivatives    Current Outpatient Medications on File Prior to Visit  Medication Sig Dispense Refill   atorvastatin  (LIPITOR) 10 MG tablet Take 1/2 (one-half) tablet by mouth once daily 45 tablet 3   Cholecalciferol (VITAMIN D3) 2000 UNITS TABS Take by mouth.     fenofibrate  54 MG tablet Take 1 tablet (54 mg total) by mouth daily. 90 tablet 3   ketoconazole  (NIZORAL ) 2 % cream Apply 1 Application topically daily as needed. To rash/affected area 15 g 2   levothyroxine  (SYNTHROID ) 112 MCG tablet Take 1 tablet (112 mcg total) by mouth daily. 90 tablet 3   losartan  (COZAAR ) 25 MG tablet Take 1 tablet (25 mg total) by mouth daily. 90 tablet 3   Magnesium 400 MG CAPS Take 1 tablet by mouth 3 (three) times a week.     metFORMIN  (GLUCOPHAGE ) 500 MG tablet Take 0.5 tablets (250 mg total) by mouth 2 (two) times daily with a meal. 90 tablet 3   Multiple Vitamins-Minerals (MULTIVITAMIN,TX-MINERALS) tablet Take 1 tablet  by mouth 2 (two) times a week.     Omega-3 Fatty Acids (OMEGA 3 PO) Take 1 capsule by mouth 2 (two) times a week.      vitamin C (ASCORBIC ACID) 500 MG tablet Take 500 mg by mouth daily.     Vitamin E 400 units TABS Take 800 Units by mouth daily.     zinc gluconate 50 MG tablet Take 50 mg by mouth 2 (two) times a week.     No current facility-administered medications on file prior to visit.    Review of Systems  Constitutional:  Negative for activity change, appetite change, fatigue, fever and unexpected weight change.  HENT:  Negative for congestion, ear pain, rhinorrhea, sinus pressure and sore throat.   Eyes:  Negative for pain, redness and visual disturbance.   Respiratory:  Positive for cough. Negative for shortness of breath and wheezing.   Cardiovascular:  Negative for chest pain and palpitations.  Gastrointestinal:  Positive for abdominal pain. Negative for abdominal distention, blood in stool, constipation and diarrhea.       Abd /rib pain with position change   Endocrine: Negative for polydipsia and polyuria.  Genitourinary:  Negative for dysuria, frequency and urgency.  Musculoskeletal:  Negative for arthralgias, back pain and myalgias.       Feet hurt   Skin:  Negative for pallor and rash.  Allergic/Immunologic: Negative for environmental allergies.  Neurological:  Negative for dizziness, syncope and headaches.  Hematological:  Negative for adenopathy. Does not bruise/bleed easily.  Psychiatric/Behavioral:  Negative for decreased concentration and dysphoric mood. The patient is not nervous/anxious.        Stressed        Objective:   Physical Exam Constitutional:      General: She is not in acute distress.    Appearance: Normal appearance. She is well-developed and normal weight. She is not ill-appearing or diaphoretic.  HENT:     Head: Normocephalic and atraumatic.     Mouth/Throat:     Mouth: Mucous membranes are moist.     Pharynx: Oropharynx is clear.   Eyes:     General: No scleral icterus.       Right eye: No discharge.        Left eye: No discharge.     Conjunctiva/sclera: Conjunctivae normal.     Pupils: Pupils are equal, round, and reactive to light.   Neck:     Thyroid : No thyromegaly.     Vascular: No carotid bruit or JVD.   Cardiovascular:     Rate and Rhythm: Normal rate and regular rhythm.     Heart sounds: Normal heart sounds.     No gallop.  Pulmonary:     Effort: Pulmonary effort is normal. No respiratory distress.     Breath sounds: Normal breath sounds. No stridor. No wheezing, rhonchi or rales.     Comments: Lower ribs anteriorly -tender bilat No crepitus  Chest:     Chest wall: Tenderness  present.  Abdominal:     General: Abdomen is protuberant. Bowel sounds are normal. There is no distension or abdominal bruit.     Palpations: Abdomen is soft. There is no hepatomegaly, splenomegaly, mass or pulsatile mass.     Tenderness: There is abdominal tenderness in the right upper quadrant, epigastric area and left upper quadrant. There is no right CVA tenderness, left CVA tenderness, guarding or rebound. Negative signs include Murphy's sign and McBurney's sign.     Hernia: No hernia is present.  Comments: Tender over lower ribs and upper abdomen worse on left  Pt notes pain with twisting movement also    Musculoskeletal:     Cervical back: Normal range of motion and neck supple. No tenderness.     Right lower leg: No edema.     Left lower leg: No edema.  Lymphadenopathy:     Cervical: No cervical adenopathy.   Skin:    General: Skin is warm and dry.     Coloration: Skin is not pale.     Findings: No lesion or rash.   Neurological:     Mental Status: She is alert.     Motor: No weakness.     Coordination: Coordination normal.     Gait: Gait normal.     Deep Tendon Reflexes: Reflexes are normal and symmetric. Reflexes normal.   Psychiatric:        Mood and Affect: Mood normal.           Assessment & Plan:   Problem List Items Addressed This Visit       Cardiovascular and Mediastinum   Essential hypertension   bp in fair control at this time  BP Readings from Last 1 Encounters:  10/20/23 (!) 144/72   Continues losartan  25 mg daily but forgot to take med this am so blood pressure is up  Follow up planned 2 wk  Lab today Most recent labs reviewed  Disc lifstyle change with low sodium diet and exercise        Relevant Orders   Basic metabolic panel with GFR (Completed)   CBC with Differential/Platelet (Completed)   Hepatic function panel (Completed)     Endocrine   Hyperlipidemia associated with type 2 diabetes mellitus (HCC)   Disc goals for lipids  and reasons to control them Rev last labs with pt Rev low sat fat diet in detail  LDL of 45 Well controlled  Continues atorvastatin  5 mg daily  Also fenofibrate  54 mg daily       Diabetes type 2, controlled (HCC) - Primary   Lab Results  Component Value Date   HGBA1C 6.3 (A) 10/20/2023   HGBA1C 5.7 01/26/2023   HGBA1C 5.8 (A) 07/30/2022   Metforin 250 mg bid Diet is off due to stress Discussed emotional eating  Discussed goal of low glycemic diet  Will continue to follow  Microalb utd  On arb and statin        Relevant Orders   POCT HgB A1C (Completed)     Other   Upper abdominal pain   Since 2022 - thought this went away but pt notes still bothers her  Worse with twisting and bending  Worse on left  Today on exam both lower ant rib and abd soft tissue tenderness  No rebound or guarding Denies GI symptoms  uS  abd in 2022 noted fatty liver   In setting of past breast cancer   Cxr today : normal /clear  May need more imaging , will discuss at f/u      Relevant Orders   Basic metabolic panel with GFR (Completed)   CBC with Differential/Platelet (Completed)   Hepatic function panel (Completed)   Lipase (Completed)   DG Chest 2 View (Completed)   Stress reaction   With emotional eating and weight gain  Daughter with breast cancer Some deaths in family   Reviewed stressors/ coping techniques/symptoms/ support sources/ tx options and side effects in detail today Declines counseling but will follow up here in  2 wk      Cough   Am cough for a few minutes daily  Scant phlegm  Denies GERD symptoms  Possible allergies Lower anterior ribs tender today   Cxr ordered : clear  Reassuring exam      Relevant Orders   DG Chest 2 View (Completed)

## 2023-10-20 NOTE — Assessment & Plan Note (Addendum)
 Since 2022 - thought this went away but pt notes still bothers her  Worse with twisting and bending  Worse on left  Today on exam both lower ant rib and abd soft tissue tenderness  No rebound or guarding Denies GI symptoms  uS  abd in 2022 noted fatty liver   In setting of past breast cancer   Cxr today : normal /clear  May need more imaging , will discuss at f/u

## 2023-10-20 NOTE — Assessment & Plan Note (Addendum)
 Am cough for a few minutes daily  Scant phlegm  Denies GERD symptoms  Possible allergies Lower anterior ribs tender today   Cxr ordered : clear  Reassuring exam

## 2023-10-20 NOTE — Assessment & Plan Note (Signed)
 Lab Results  Component Value Date   HGBA1C 6.3 (A) 10/20/2023   HGBA1C 5.7 01/26/2023   HGBA1C 5.8 (A) 07/30/2022   Metforin 250 mg bid Diet is off due to stress Discussed emotional eating  Discussed goal of low glycemic diet  Will continue to follow  Microalb utd  On arb and statin

## 2023-10-20 NOTE — Patient Instructions (Addendum)
 Stay active  Add some strength training to your routine, this is important for bone and brain health and can reduce your risk of falls and help your body use insulin properly and regulate weight  Light weights, exercise bands , and internet videos are a good way to start  Yoga (chair or regular), machines , floor exercises or a gym with machines are also good options   Continue the metformin  250 mg twice daily  Get back low glycemic eating   Continue current medicines   Chest xray today  Labs today   Follow up in 2 weeks   Gradually get back to regular habits If you want to talk to a mental health counselor (stress/emotional eating) let me know

## 2023-10-20 NOTE — Assessment & Plan Note (Signed)
LFT today

## 2023-11-03 ENCOUNTER — Ambulatory Visit: Admitting: Family Medicine

## 2023-11-03 ENCOUNTER — Encounter: Payer: Self-pay | Admitting: Family Medicine

## 2023-11-03 ENCOUNTER — Ambulatory Visit: Payer: Self-pay | Admitting: Family Medicine

## 2023-11-03 VITALS — BP 131/70 | HR 69 | Temp 98.3°F | Ht 65.5 in | Wt 171.0 lb

## 2023-11-03 DIAGNOSIS — R10819 Abdominal tenderness, unspecified site: Secondary | ICD-10-CM | POA: Diagnosis not present

## 2023-11-03 DIAGNOSIS — Z853 Personal history of malignant neoplasm of breast: Secondary | ICD-10-CM | POA: Diagnosis not present

## 2023-11-03 DIAGNOSIS — R1010 Upper abdominal pain, unspecified: Secondary | ICD-10-CM | POA: Diagnosis not present

## 2023-11-03 DIAGNOSIS — E119 Type 2 diabetes mellitus without complications: Secondary | ICD-10-CM

## 2023-11-03 DIAGNOSIS — I10 Essential (primary) hypertension: Secondary | ICD-10-CM

## 2023-11-03 LAB — MICROALBUMIN / CREATININE URINE RATIO
Creatinine,U: 73.3 mg/dL
Microalb Creat Ratio: UNDETERMINED mg/g (ref 0.0–30.0)
Microalb, Ur: <0.7 mg/dL

## 2023-11-03 MED ORDER — FAMOTIDINE 20 MG PO TABS: 20.0000 mg | ORAL_TABLET | Freq: Two times a day (BID) | ORAL | 0 refills | Status: AC

## 2023-11-03 NOTE — Assessment & Plan Note (Signed)
 bp in fair control at this time  BP Readings from Last 1 Encounters:  11/03/23 131/70   Continues losartan  25 mg daily but forgot to take med this am so blood pressure is up  Most recent labs reviewed Encouraged good lifestyle habits

## 2023-11-03 NOTE — Assessment & Plan Note (Addendum)
 Worse with twisting and bending  Tender in upper abdomen bilaterally and some over lower ribs Cxr normal  Known fatty liver   Would like to get CT scan   Will try generic pepcid for acid to see if it helps

## 2023-11-03 NOTE — Assessment & Plan Note (Addendum)
 Upper abdomen is tender  Pain worsens to twist/turn  No other GI symptoms  Known fatty liver on us  2022  Personal history of breast cancer Recent normal cxr   Will try pepcid for acid to see if this helps  Labs reviewed  Would like to get CT abd/pelvis

## 2023-11-03 NOTE — Patient Instructions (Addendum)
 Blood pressure is better Continue current medicines    Try generic pepcid 20 mg twice daily to cut acid in stomach to see if it helps pain    I want to order some imaging   Please let us  know if you don't hear in 1-2 weeks to set that up   If symptoms worsen in the meantime let us  know

## 2023-11-03 NOTE — Assessment & Plan Note (Signed)
 Due for microalb  Ordered

## 2023-11-03 NOTE — Progress Notes (Signed)
 Subjective:    Patient ID: Carla Mccoy, female    DOB: 09-02-1950, 73 y.o.   MRN: [id number]  HPI  Wt Readings from Last 3 Encounters:  11/03/23 171 lb (77.6 kg)  10/20/23 172 lb 4 oz (78.1 kg)  06/30/23 154 lb (69.9 kg)   28.02 kg/m  Vitals:   11/03/23 0925 11/03/23 1815  BP: (!) 142/76 131/70  Pulse: 69   Temp: 98.3 F (36.8 C)   SpO2: 99%    Pt presents for follow up of  HTN Chronic pain of upper abdomen   Since last visit husband had eye surgery  She has to do everything for him for a while /he is limited for the next 2 weeks at least   HTN bp is stable today  Last visit forgot to take her medicine  No cp or palpitations or headaches or edema  No side effects to medicines  BP Readings from Last 3 Encounters:  11/03/23 131/70  10/20/23 (!) 144/72  02/02/23 122/60    Losartan  25 mg daily   Has not checked at home recently     Lab Results  Component Value Date   NA 139 10/20/2023   K 4.8 10/20/2023   CO2 29 10/20/2023   GLUCOSE 109 (H) 10/20/2023   BUN 15 10/20/2023   CREATININE 0.99 10/20/2023   CALCIUM  9.8 10/20/2023   GFR 56.08 (L) 10/20/2023   GFRNONAA >60 05/31/2009    Chronic upper abdomen pain - since 2022 intermittent  Worse with twisting and bending Worse on the left  Us  abd 2022 noted fatty liver Cxr was normal at last visit   DG Chest 2 View Result Date: 10/20/2023 CLINICAL DATA:  morning cough (mildly productive) also pain in bilateral lower ribs with twisting and turning remote h/o breast cancer EXAM: CHEST - 2 VIEW COMPARISON:  Oct 02, 2017 FINDINGS: Low lung volumes with bronchovascular crowding. No focal airspace consolidation, pleural effusion, or pneumothorax. No cardiomegaly. No acute fracture or destructive lesion. Surgical clips again noted. IMPRESSION: No acute cardiopulmonary abnormality. Electronically Signed   By: Rogelia Myers M.D.   On: 10/20/2023 13:46    Lab Results  Component Value Date   ALT 12 10/20/2023    AST 17 10/20/2023   ALKPHOS 35 (L) 10/20/2023   BILITOT 0.5 10/20/2023   Lab Results  Component Value Date   WBC 7.3 10/20/2023   HGB 13.4 10/20/2023   HCT 40.4 10/20/2023   MCV 89.7 10/20/2023   PLT 348.0 10/20/2023   Lab Results  Component Value Date   LIPASE 45.0 10/20/2023    Still has pain when she twists and turns and sometimes when she bends forward  Does not feel like a muscle spasm  Feels like something is there   Very tender in her abdomen   No GI symptoms beyond constipation      Patient Active Problem List   Diagnosis Date Noted   Abdominal tenderness 11/03/2023   Cough 10/20/2023   Stress reaction 10/20/2023   Foot pain, bilateral 02/02/2023   Intertrigo 01/29/2022   Hearing loss 01/31/2021   Upper abdominal pain 04/17/2020   Estrogen deficiency 11/03/2017   Osteopenia 11/03/2017   Leg cramps 11/18/2016   Family history of congenital heart disease 11/18/2016   Cough due to ACE inhibitor 11/18/2016   Routine general medical examination at a health care facility 03/26/2015   Colon cancer screening 03/24/2014   Encounter for Medicare annual wellness exam 03/24/2014   Diabetes type  2, controlled (HCC) 03/17/2014   Benign paroxysmal positional vertigo 08/26/2013   OSA (obstructive sleep apnea) 08/10/2013   Chronic mastitis of left breast 07/04/2013   History of breast cancer 02/10/2012   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   BACK PAIN, CHRONIC 05/10/2007   PEPTIC ULCER DISEASE, HX OF 05/10/2007   HYSTERECTOMY, HX OF 05/10/2007   TONSILLECTOMY, HX OF 05/10/2007   Hypothyroidism 01/28/2007   Hyperlipidemia associated with type 2 diabetes mellitus (HCC) 01/28/2007   COMMON MIGRAINE 01/28/2007   Essential hypertension 01/28/2007   FATTY LIVER DISEASE 01/28/2007   CARPAL TUNNEL SYNDROME, HX OF 01/27/2007   Past Medical History:  Diagnosis Date   Allergy    Angioneurotic edema not elsewhere classified    ACE-I   Arthritis    Backache, unspecified     breast ca dx'd 06/2009   breast cancer left - lumpectomy   Diabetes mellitus without complication (HCC)    Full dentures    Migraine without aura, without mention of intractable migraine without mention of status migrainosus    Obesity, unspecified    Other abnormal glucose    Other and unspecified hyperlipidemia    Other chronic nonalcoholic liver disease    Personal history of peptic ulcer disease    Personal history of radiation therapy    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 Cornett, MD;  Location: [facility];  Service: General;  Laterality: Left;   INCISION AND DRAINAGE OF WOUND Left 12/21/2013   Procedure: DEBRIDEMENT OF LEFT BREAST WOUND WITH PLACEMENT OF A CELL;  Surgeon: Estefana Reichert, DO;  Location: [facility];  Service: Plastics;  Laterality: Left;   LAPAROSCOPY ABDOMEN DIAGNOSTIC     TONSILLECTOMY     TUBAL LIGATION  1971   Social History   Tobacco Use   Smoking status: Never   Smokeless tobacco: Never  Vaping Use   Vaping status: Never Used  Substance Use Topics   Alcohol use: No    Alcohol/week: 0.0 standard drinks of alcohol   Drug use: No   Family History  Problem Relation Age of Onset   Peripheral vascular disease Mother    Coronary artery disease Mother    Hyperlipidemia Mother    Heart disease Mother        CAD/fatal MI   Diabetes Mother    Stroke  Mother    Diabetes Sister    Hypothyroidism Sister    Diabetes Brother    Hyperlipidemia Brother    Heart disease Brother    Hypertrophic cardiomyopathy Brother        congenital   Breast cancer Maternal Grandmother    Breast cancer Daughter    Colon cancer Neg Hx    Esophageal cancer Neg Hx    Pancreatic cancer Neg Hx    Stomach cancer Neg Hx  Rectal cancer Neg Hx    Allergies  Allergen Reactions   Metoprolol     Lips swelled/ hands swelled and her skin turned red    Sulfonamide Derivatives    Current Outpatient Medications on File Prior to Visit  Medication Sig Dispense Refill   atorvastatin  (LIPITOR) 10 MG tablet Take 1/2 (one-half) tablet by mouth once daily 45 tablet 3   Cholecalciferol (VITAMIN D3) 2000 UNITS TABS Take by mouth.     fenofibrate  54 MG tablet Take 1 tablet (54 mg total) by mouth daily. 90 tablet 3   ketoconazole  (NIZORAL ) 2 % cream Apply 1 Application topically daily as needed. To rash/affected area 15 g 2   levothyroxine  (SYNTHROID ) 112 MCG tablet Take 1 tablet (112 mcg total) by mouth daily. 90 tablet 3   losartan  (COZAAR ) 25 MG tablet Take 1 tablet (25 mg total) by mouth daily. 90 tablet 3   Magnesium 400 MG CAPS Take 1 tablet by mouth 3 (three) times a week.     metFORMIN  (GLUCOPHAGE ) 500 MG tablet Take 0.5 tablets (250 mg total) by mouth 2 (two) times daily with a meal. 90 tablet 3   Multiple Vitamins-Minerals (MULTIVITAMIN,TX-MINERALS) tablet Take 1 tablet by mouth 2 (two) times a week.     Omega-3 Fatty Acids (OMEGA 3 PO) Take 1 capsule by mouth 2 (two) times a week.      vitamin C (ASCORBIC ACID) 500 MG tablet Take 500 mg by mouth daily.     Vitamin E 400 units TABS Take 800 Units by mouth daily.     zinc gluconate 50 MG tablet Take 50 mg by mouth 2 (two) times a week.     No current facility-administered medications on file prior to visit.    Review of Systems  Constitutional:  Positive for fatigue. Negative for activity change, appetite  change, fever and unexpected weight change.  HENT:  Negative for congestion, ear pain, rhinorrhea, sinus pressure and sore throat.   Eyes:  Negative for pain, redness and visual disturbance.  Respiratory:  Negative for cough, shortness of breath and wheezing.   Cardiovascular:  Negative for chest pain and palpitations.  Gastrointestinal:  Positive for abdominal pain. Negative for abdominal distention, anal bleeding, blood in stool, constipation, diarrhea, nausea, rectal pain and vomiting.  Endocrine: Negative for polydipsia and polyuria.  Genitourinary:  Negative for dysuria, frequency and urgency.  Musculoskeletal:  Negative for arthralgias, back pain and myalgias.  Skin:  Negative for pallor and rash.  Allergic/Immunologic: Negative for environmental allergies.  Neurological:  Negative for dizziness, syncope and headaches.  Hematological:  Negative for adenopathy. Does not bruise/bleed easily.  Psychiatric/Behavioral:  Negative for decreased concentration and dysphoric mood. The patient is not nervous/anxious.        Objective:   Physical Exam Constitutional:      General: She is not in acute distress.    Appearance: Normal appearance. She is well-developed. She is not ill-appearing or diaphoretic.     Comments: Overweight   HENT:     Head: Normocephalic and atraumatic.     Mouth/Throat:     Mouth: Mucous membranes are moist.     Pharynx: Oropharynx is clear.   Eyes:     General: No scleral icterus.       Right eye: No discharge.        Left eye: No discharge.     Conjunctiva/sclera: Conjunctivae normal.     Pupils: Pupils are equal, round, and reactive to light.   Neck:  Thyroid : No thyromegaly.     Vascular: No carotid bruit or JVD.   Cardiovascular:     Rate and Rhythm: Normal rate and regular rhythm.     Heart sounds: Normal heart sounds.     No gallop.  Pulmonary:     Effort: Pulmonary effort is normal. No respiratory distress.     Breath sounds: Normal breath  sounds. No stridor. No wheezing, rhonchi or rales.  Abdominal:     General: Abdomen is protuberant. Bowel sounds are normal. There is no distension or abdominal bruit.     Palpations: Abdomen is soft. There is no shifting dullness, fluid wave, hepatomegaly or splenomegaly.     Tenderness: There is abdominal tenderness in the right upper quadrant, epigastric area and left upper quadrant. There is no right CVA tenderness, left CVA tenderness, guarding or rebound.     Comments: Marked tenderness across entire upper abdomen    Musculoskeletal:     Cervical back: Normal range of motion and neck supple.     Right lower leg: No edema.     Left lower leg: No edema.  Lymphadenopathy:     Cervical: No cervical adenopathy.   Skin:    General: Skin is warm and dry.     Coloration: Skin is not pale.     Findings: No rash.   Neurological:     Mental Status: She is alert.     Coordination: Coordination normal.     Deep Tendon Reflexes: Reflexes are normal and symmetric. Reflexes normal.   Psychiatric:        Mood and Affect: Mood normal.           Assessment & Plan:   Problem List Items Addressed This Visit       Cardiovascular and Mediastinum   Essential hypertension   bp in fair control at this time  BP Readings from Last 1 Encounters:  11/03/23 131/70   Continues losartan  25 mg daily but forgot to take med this am so blood pressure is up  Most recent labs reviewed Encouraged good lifestyle habits          Endocrine   Diabetes type 2, controlled (HCC)   Due for microalb  Ordered       Relevant Orders   Microalbumin / creatinine urine ratio (Completed)     Other   Upper abdominal pain - Primary   Worse with twisting and bending  Tender in upper abdomen bilaterally and some over lower ribs Cxr normal  Known fatty liver   Would like to get CT scan   Will try generic pepcid for acid to see if it helps       Relevant Orders   CT ABDOMEN PELVIS W CONTRAST    History of breast cancer   Relevant Orders   CT ABDOMEN PELVIS W CONTRAST   Abdominal tenderness   Upper abdomen is tender  Pain worsens to twist/turn  No other GI symptoms  Known fatty liver on us  2022  Personal history of breast cancer Recent normal cxr   Will try pepcid for acid to see if this helps  Labs reviewed  Would like to get CT abd/pelvis       Relevant Orders   CT ABDOMEN PELVIS W CONTRAST

## 2023-11-17 ENCOUNTER — Ambulatory Visit
Admission: RE | Admit: 2023-11-17 | Discharge: 2023-11-17 | Disposition: A | Discharge status: Home / Self Care | Source: Ambulatory Visit | Attending: Family Medicine | Admitting: Family Medicine

## 2023-11-17 DIAGNOSIS — R1010 Upper abdominal pain, unspecified: Secondary | ICD-10-CM

## 2023-11-17 DIAGNOSIS — R10819 Abdominal tenderness, unspecified site: Secondary | ICD-10-CM

## 2023-11-17 DIAGNOSIS — Z853 Personal history of malignant neoplasm of breast: Secondary | ICD-10-CM

## 2023-11-18 ENCOUNTER — Inpatient Hospital Stay
Admission: RE | Admit: 2023-11-18 | Discharge: 2023-11-18 | Discharge status: Home / Self Care | Source: Ambulatory Visit | Attending: Family Medicine | Admitting: Family Medicine

## 2023-11-18 DIAGNOSIS — K5730 Diverticulosis of large intestine without perforation or abscess without bleeding: Secondary | ICD-10-CM | POA: Diagnosis not present

## 2023-11-18 DIAGNOSIS — N261 Atrophy of kidney (terminal): Secondary | ICD-10-CM | POA: Diagnosis not present

## 2023-11-18 MED ORDER — IOPAMIDOL (ISOVUE-300) INJECTION 61%
100.0000 mL | Freq: Once | INTRAVENOUS | Status: AC | PRN
  Administered 2023-11-18: 100 mL via INTRAVENOUS

## 2023-11-30 ENCOUNTER — Encounter: Payer: Self-pay | Admitting: Family Medicine

## 2023-11-30 ENCOUNTER — Ambulatory Visit (INDEPENDENT_AMBULATORY_CARE_PROVIDER_SITE_OTHER): Admitting: Family Medicine

## 2023-11-30 VITALS — BP 126/72 | HR 63 | Temp 97.9°F | Ht 65.5 in | Wt 170.0 lb

## 2023-11-30 DIAGNOSIS — I10 Essential (primary) hypertension: Secondary | ICD-10-CM

## 2023-11-30 DIAGNOSIS — N6320 Unspecified lump in the left breast, unspecified quadrant: Secondary | ICD-10-CM | POA: Diagnosis not present

## 2023-11-30 DIAGNOSIS — R10819 Abdominal tenderness, unspecified site: Secondary | ICD-10-CM

## 2023-11-30 DIAGNOSIS — E1169 Type 2 diabetes mellitus with other specified complication: Secondary | ICD-10-CM | POA: Diagnosis not present

## 2023-11-30 DIAGNOSIS — E785 Hyperlipidemia, unspecified: Secondary | ICD-10-CM

## 2023-11-30 DIAGNOSIS — N261 Atrophy of kidney (terminal): Secondary | ICD-10-CM | POA: Diagnosis not present

## 2023-11-30 DIAGNOSIS — I998 Other disorder of circulatory system: Secondary | ICD-10-CM | POA: Diagnosis not present

## 2023-11-30 NOTE — Assessment & Plan Note (Signed)
 bp in fair control at this time  BP Readings from Last 1 Encounters:  11/30/23 126/72   Continues losartan  25 mg daily but forgot to take med this am so blood pressure is up  Most recent labs reviewed Encouraged good lifestyle habits

## 2023-11-30 NOTE — Assessment & Plan Note (Signed)
 Noted on CT-incidental  No symptoms  Offered ref for cardiac ca score or cardiology  Declined for now   Encouraged to keep cholesterol well controlled  Does have history of CAD in sisters

## 2023-11-30 NOTE — Assessment & Plan Note (Signed)
Disc goals for lipids and reasons to control them Rev last labs with pt Rev low sat fat diet in detail  LDL of 45 Well controlled  Continues atorvastatin 5 mg daily  Also fenofibrate 54 mg daily

## 2023-11-30 NOTE — Assessment & Plan Note (Signed)
 Pt has upper abd pain /also tenderness with position change/twisting No cause found on CT No improvement with H2 blocker May be msk in etiology

## 2023-11-30 NOTE — Patient Instructions (Addendum)
 If you want to see cardiology or have a cardiac calcium  score (usually 200$) let us  know   Keep up a very good fluid intake for kidney health   Take care of yourself   I want to get some breast imaging   You have an order for:  [x]   3D Mammogram and ultrasound  []   Bone Density     Please call for appointment:   []   [facility]  [address] [place] [address]  [phone]  []   [facility])   [address]  [place], [place] [address]  [phone]  [x]   [facility]      [address] [place], [place]        [phone]         []   [facility]  [address] [place], [place]  [phone]  []  [facility]   [address]   [place], [place] [address]  [phone]  []  [facility]  [address] [place], [place] [address] [phone]    Make sure to wear two piece clothing  No lotions powders or deodorants the day of the appointment Make sure to bring picture ID and insurance card.  Bring list of medications you are currently taking including any supplements.   Schedule your screening mammogram through MyChart!   Select [facility] imaging sites can now be scheduled through MyChart.  Log into your MyChart account.  Go to 'Visit' (or 'Appointments' if  on mobile App) --> Schedule an  Appointment  Under 'Select a Reason for Visit' choose the Mammogram  Screening option.  Complete the pre-visit questions  and select the time and place that  best fits your schedule

## 2023-11-30 NOTE — Assessment & Plan Note (Signed)
 Noted on CT/nodule Likely due to old scarring and mastitis in left lower breast   No change on exam  Us  and mammo ordered -diagnostic
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
## 2023-11-30 NOTE — Assessment & Plan Note (Signed)
 Right renal paranchyma atrophy and scarring (incidental CT finding) Unsure when this occurred Renal labs stable Will continue to monitor

## 2023-12-02 ENCOUNTER — Inpatient Hospital Stay
Admission: RE | Admit: 2023-12-02 | Discharge: 2023-12-02 | Discharge status: Home / Self Care | Source: Ambulatory Visit | Attending: Family Medicine | Admitting: Family Medicine

## 2023-12-02 ENCOUNTER — Ambulatory Visit: Payer: Self-pay | Admitting: Family Medicine

## 2023-12-02 ENCOUNTER — Ambulatory Visit

## 2023-12-02 DIAGNOSIS — R92332 Mammographic heterogeneous density, left breast: Secondary | ICD-10-CM | POA: Diagnosis not present

## 2023-12-02 DIAGNOSIS — N6320 Unspecified lump in the left breast, unspecified quadrant: Secondary | ICD-10-CM

## 2023-12-02 DIAGNOSIS — N6489 Other specified disorders of breast: Secondary | ICD-10-CM | POA: Diagnosis not present

## 2023-12-02 DIAGNOSIS — Z853 Personal history of malignant neoplasm of breast: Secondary | ICD-10-CM | POA: Diagnosis not present

## 2024-01-24 ENCOUNTER — Telehealth: Payer: Self-pay | Admitting: Family Medicine

## 2024-01-24 DIAGNOSIS — E119 Type 2 diabetes mellitus without complications: Secondary | ICD-10-CM

## 2024-01-24 DIAGNOSIS — E039 Hypothyroidism, unspecified: Secondary | ICD-10-CM

## 2024-01-24 DIAGNOSIS — I10 Essential (primary) hypertension: Secondary | ICD-10-CM

## 2024-01-24 DIAGNOSIS — E1169 Type 2 diabetes mellitus with other specified complication: Secondary | ICD-10-CM

## 2024-01-24 NOTE — Telephone Encounter (Signed)
-----   Message from Harlene Du sent at 01/14/2024  1:58 PM EDT ----- Regarding: Lab Thurs 01/28/24 Hello,  Patient is coming in for CPE labs on Thursday 01/28/24. Can we get orders please.   Thanks

## 2024-01-28 ENCOUNTER — Ambulatory Visit: Payer: Self-pay | Admitting: Family Medicine

## 2024-01-28 ENCOUNTER — Other Ambulatory Visit: Payer: PPO

## 2024-01-28 DIAGNOSIS — I10 Essential (primary) hypertension: Secondary | ICD-10-CM | POA: Diagnosis not present

## 2024-01-28 DIAGNOSIS — E785 Hyperlipidemia, unspecified: Secondary | ICD-10-CM

## 2024-01-28 DIAGNOSIS — E119 Type 2 diabetes mellitus without complications: Secondary | ICD-10-CM

## 2024-01-28 DIAGNOSIS — E1169 Type 2 diabetes mellitus with other specified complication: Secondary | ICD-10-CM

## 2024-01-28 DIAGNOSIS — E039 Hypothyroidism, unspecified: Secondary | ICD-10-CM

## 2024-01-28 LAB — COMPREHENSIVE METABOLIC PANEL WITH GFR
ALT: 11 U/L (ref 0–35)
AST: 16 U/L (ref 0–37)
Albumin: 4.3 g/dL (ref 3.5–5.2)
Alkaline Phosphatase: 30 U/L — ABNORMAL LOW (ref 39–117)
BUN: 20 mg/dL (ref 6–23)
CO2: 27 meq/L (ref 19–32)
Calcium: 9.9 mg/dL (ref 8.4–10.5)
Chloride: 105 meq/L (ref 96–112)
Creatinine, Ser: 1.04 mg/dL (ref 0.40–1.20)
GFR: 52.76 mL/min — ABNORMAL LOW (ref >60.00)
Glucose, Bld: 145 mg/dL — ABNORMAL HIGH (ref 70–99)
Potassium: 4.9 meq/L (ref 3.5–5.1)
Sodium: 140 meq/L (ref 135–145)
Total Bilirubin: 0.5 mg/dL (ref 0.2–1.2)
Total Protein: 6.9 g/dL (ref 6.0–8.3)

## 2024-01-28 LAB — CBC WITH DIFFERENTIAL/PLATELET
Basophils Absolute: 0.1 K/uL (ref 0.0–0.1)
Basophils Relative: 0.9 % (ref 0.0–3.0)
Eosinophils Absolute: 0.3 K/uL (ref 0.0–0.7)
Eosinophils Relative: 5.1 % — ABNORMAL HIGH (ref 0.0–5.0)
HCT: 40.6 % (ref 36.0–46.0)
Hemoglobin: 13.5 g/dL (ref 12.0–15.0)
Lymphocytes Relative: 27.3 % (ref 12.0–46.0)
Lymphs Abs: 1.8 K/uL (ref 0.7–4.0)
MCHC: 33.3 g/dL (ref 30.0–36.0)
MCV: 89.9 fl (ref 78.0–100.0)
Monocytes Absolute: 0.5 K/uL (ref 0.1–1.0)
Monocytes Relative: 7.9 % (ref 3.0–12.0)
Neutro Abs: 3.9 K/uL (ref 1.4–7.7)
Neutrophils Relative %: 58.8 % (ref 43.0–77.0)
Platelets: 331 K/uL (ref 150.0–400.0)
RBC: 4.51 Mil/uL (ref 3.87–5.11)
RDW: 13.2 % (ref 11.5–15.5)
WBC: 6.7 K/uL (ref 4.0–10.5)

## 2024-01-28 LAB — LIPID PANEL
Cholesterol: 119 mg/dL (ref 0–200)
HDL: 41.6 mg/dL (ref >39.00)
LDL Cholesterol: 48 mg/dL (ref 0–99)
NonHDL: 77.21
Total CHOL/HDL Ratio: 3
Triglycerides: 146 mg/dL (ref 0.0–149.0)
VLDL: 29.2 mg/dL (ref 0.0–40.0)

## 2024-01-28 LAB — TSH: TSH: 1.56 u[IU]/mL (ref 0.35–5.50)

## 2024-01-28 LAB — HEMOGLOBIN A1C: Hgb A1c MFr Bld: 7.4 % — ABNORMAL HIGH (ref 4.6–6.5)

## 2024-02-04 ENCOUNTER — Ambulatory Visit: Payer: PPO | Admitting: Family Medicine

## 2024-02-04 ENCOUNTER — Encounter: Payer: Self-pay | Admitting: Family Medicine

## 2024-02-04 VITALS — BP 128/65 | HR 70 | Temp 98.4°F | Ht 65.5 in | Wt 169.2 lb

## 2024-02-04 DIAGNOSIS — E039 Hypothyroidism, unspecified: Secondary | ICD-10-CM

## 2024-02-04 DIAGNOSIS — E785 Hyperlipidemia, unspecified: Secondary | ICD-10-CM | POA: Diagnosis not present

## 2024-02-04 DIAGNOSIS — E1169 Type 2 diabetes mellitus with other specified complication: Secondary | ICD-10-CM | POA: Diagnosis not present

## 2024-02-04 DIAGNOSIS — Z0000 Encounter for general adult medical examination without abnormal findings: Secondary | ICD-10-CM

## 2024-02-04 DIAGNOSIS — H903 Sensorineural hearing loss, bilateral: Secondary | ICD-10-CM

## 2024-02-04 DIAGNOSIS — F430 Acute stress reaction: Secondary | ICD-10-CM | POA: Diagnosis not present

## 2024-02-04 DIAGNOSIS — M79672 Pain in left foot: Secondary | ICD-10-CM | POA: Diagnosis not present

## 2024-02-04 DIAGNOSIS — K760 Fatty (change of) liver, not elsewhere classified: Secondary | ICD-10-CM

## 2024-02-04 DIAGNOSIS — R10819 Abdominal tenderness, unspecified site: Secondary | ICD-10-CM | POA: Diagnosis not present

## 2024-02-04 DIAGNOSIS — I10 Essential (primary) hypertension: Secondary | ICD-10-CM

## 2024-02-04 DIAGNOSIS — Z23 Encounter for immunization: Secondary | ICD-10-CM | POA: Diagnosis not present

## 2024-02-04 DIAGNOSIS — N261 Atrophy of kidney (terminal): Secondary | ICD-10-CM

## 2024-02-04 DIAGNOSIS — M8589 Other specified disorders of bone density and structure, multiple sites: Secondary | ICD-10-CM

## 2024-02-04 DIAGNOSIS — M79671 Pain in right foot: Secondary | ICD-10-CM | POA: Diagnosis not present

## 2024-02-04 DIAGNOSIS — E119 Type 2 diabetes mellitus without complications: Secondary | ICD-10-CM | POA: Insufficient documentation

## 2024-02-04 DIAGNOSIS — Z1211 Encounter for screening for malignant neoplasm of colon: Secondary | ICD-10-CM

## 2024-02-04 DIAGNOSIS — Z7984 Long term (current) use of oral hypoglycemic drugs: Secondary | ICD-10-CM

## 2024-02-04 MED ORDER — METFORMIN HCL 500 MG PO TABS: 500.0000 mg | ORAL_TABLET | Freq: Two times a day (BID) | ORAL | 3 refills | Status: AC

## 2024-02-04 MED ORDER — ATORVASTATIN CALCIUM 10 MG PO TABS: ORAL_TABLET | ORAL | 3 refills | Status: AC

## 2024-02-04 MED ORDER — LEVOTHYROXINE SODIUM 112 MCG PO TABS: 112.0000 ug | ORAL_TABLET | Freq: Every day | ORAL | 3 refills | Status: AC

## 2024-02-04 MED ORDER — FENOFIBRATE 54 MG PO TABS: 54.0000 mg | ORAL_TABLET | Freq: Every day | ORAL | 3 refills | Status: AC

## 2024-02-04 MED ORDER — LOSARTAN POTASSIUM 25 MG PO TABS: 25.0000 mg | ORAL_TABLET | Freq: Every day | ORAL | 3 refills | Status: AC

## 2024-02-04 NOTE — Assessment & Plan Note (Signed)
 Mild decrease in GFR Continue to monitor  Good fluid intake  Avoids nsaids

## 2024-02-04 NOTE — Patient Instructions (Addendum)
 Keep walking and working outdoors Add some strength training to your routine, this is important for bone and brain health and can reduce your risk of falls and help your body use insulin properly and regulate weight  Light weights, exercise bands , and internet videos are a good way to start  Yoga (chair or regular), machines , floor exercises or a gym with machines are also good options    Focus on taking care of yourself  If you want to talk to a counselor about stress-please let us  know   Try to get most of your carbohydrates from produce (with the exception of white potatoes) and whole grains Eat less bread/pasta/rice/snack foods/cereals/sweets and other items from the middle of the grocery store (processed carbs)    Go up on metformin  to 500 mg twice daily   Flu shot today   Follow up in 3 months

## 2024-02-04 NOTE — Assessment & Plan Note (Signed)
 Reviewed health habits including diet and exercise and skin cancer prevention Reviewed appropriate screening tests for age  Also reviewed health mt list, fam hx and immunization status , as well as social and family history   See HPI Labs reviewed and ordered Health Maintenance  Topic Date Due   Complete foot exam   02/02/2024   COVID-19 Vaccine (3 - 2025-26 season) 02/19/2026*   Medicare Annual Wellness Visit  06/29/2024   Hemoglobin A1C  07/27/2024   Eye exam for diabetics  08/02/2024   Yearly kidney health urinalysis for diabetes  11/02/2024   Yearly kidney function blood test for diabetes  01/27/2025   Colon Cancer Screening  05/15/2027   DTaP/Tdap/Td vaccine (3 - Td or Tdap) 09/13/2033   Pneumococcal Vaccine for age over 39  Completed   Flu Shot  Completed   DEXA scan (bone density measurement)  Completed   Hepatitis C Screening  Completed   Zoster (Shingles) Vaccine  Completed   HPV Vaccine  Aged Out   Meningitis B Vaccine  Aged Out   Breast Cancer Screening  Discontinued  *Topic was postponed. The date shown is not the original due date.    Flu shot given  Discussed fall prevention, supplements and exercise for bone density  PHQ/ GAD reviewed-pt declines counseling or treatment for stress reaction currently  Utd derm care

## 2024-02-04 NOTE — Assessment & Plan Note (Signed)
 Forgot her hearing aides today

## 2024-02-04 NOTE — Progress Notes (Signed)
 Subjective:    Patient ID: Carla Mccoy, female    DOB: Aug 26, 1950, 73 y.o.   MRN: [id number]  HPI  Here for health maintenance exam and to review chronic medical problems   Wt Readings from Last 3 Encounters:  02/04/24 169 lb 4 oz (76.8 kg)  11/30/23 170 lb (77.1 kg)  11/03/23 171 lb (77.6 kg)   27.74 kg/m  Vitals:   02/04/24 0811 02/04/24 0837  BP: (!) 152/80 128/65  Pulse: 70   Temp: 98.4 F (36.9 C)   SpO2: 99%     Immunization History  Administered Date(s) Administered   Fluad Quad(high Dose 65+) 01/17/2019, 01/31/2021, 01/29/2022   Fluad Trivalent(High Dose 65+) 02/02/2023   H1N1 03/06/2008   INFLUENZA, HIGH DOSE SEASONAL PF 03/28/2016, 02/04/2024   Influenza Whole 03/06/2008   Influenza,inj,Quad PF,6+ Mos 03/24/2014, 03/26/2015, 03/10/2017   Influenza-Unspecified 02/02/2013, 02/18/2018, 03/08/2020   PFIZER(Purple Top)SARS-COV-2 Vaccination 06/25/2019, 07/19/2019   Pneumococcal Conjugate-13 03/22/2013   Pneumococcal Polysaccharide-23 09/02/2011, 03/10/2017   Td 08/07/2008   Tdap 09/14/2023   Zoster Recombinant(Shingrix) 05/07/2023, 07/22/2023    Health Maintenance Due  Topic Date Due   FOOT EXAM  02/02/2024   Flu shot -today   Mammogram 08/2023 - diagnostics / had scar tissue Self breast exam- no lumps or changes  Personal history of breast cancer   Gyn health Hysterectomy  Colon cancer screening  Colonoscopy 05/2017  Bone health  Dexa 02/2023  osteopenia  Falls- none  Fractures-none  Supplements vitamin D  and calcium   Last vitamin D  Lab Results  Component Value Date   VD25OH 41 01/23/2011    Exercise  Walking  Works a lot outside - heavy work   Dermatology care  Goes yearly  Nothing new     Mood    02/04/2024    8:46 AM 06/30/2023   11:48 AM 07/30/2022    8:34 AM 06/25/2022    9:01 AM 05/27/2022   10:44 AM  Depression screen PHQ 2/9  Decreased Interest 0 0 0 0 0  Down, Depressed, Hopeless 0 0 0 0 0  PHQ - 2 Score 0 0 0 0 0   Altered sleeping 0  0 0 0  Tired, decreased energy 0  0 0 0  Change in appetite 0  0 0 0  Feeling bad or failure about yourself  0  0 0 0  Trouble concentrating 0  0 0 0  Moving slowly or fidgety/restless 0  0 0 0  Suicidal thoughts 0  0 0 0  PHQ-9 Score 0  0 0 0  Difficult doing work/chores Not difficult at all  Not difficult at all Not difficult at all Not difficult at all   More stress Daughter with breast cancer-complications with treatment  Other family members have health problems   Walking has helped -now up to 10,000 or more steps   A lot of emotional eating  Just stopped it   Now eating much better - lean protein/ more veggies     HTN bp is stable today  No cp or palpitations or headaches or edema  No side effects to medicines  BP Readings from Last 3 Encounters:  02/04/24 128/65  11/30/23 126/72  11/03/23 131/70    Losartan  25 mg daily  Did not take her medicine yet today   Blood pressure at home runs 130s /81-82    Lab Results  Component Value Date   NA 140 01/28/2024   K 4.9 01/28/2024   CO2 27  01/28/2024   GLUCOSE 145 (H) 01/28/2024   BUN 20 01/28/2024   CREATININE 1.04 01/28/2024   CALCIUM  9.9 01/28/2024   GFR 52.76 (L) 01/28/2024   GFRNONAA >60 05/31/2009   Drinking a lot of water Really works on that   Very infreq takes nsaid    Hypothyroidism  Pt has no clinical changes No change in energy level/ hair or skin/ edema and no tremor Lab Results  Component Value Date   TSH 1.56 01/28/2024     Levothyroxine  112 mcg daily    History of fatty liver  Though normal app on recent CT Lab Results  Component Value Date   ALT 11 01/28/2024   AST 16 01/28/2024   ALKPHOS 30 (L) 01/28/2024   BILITOT 0.5 01/28/2024    DM2 Lab Results  Component Value Date   HGBA1C 7.4 (H) 01/28/2024   HGBA1C 6.3 (A) 10/20/2023   HGBA1C 5.7 01/26/2023   Metformin  250 mg bid    Lab Results  Component Value Date   MICROALBUR <0.7 11/03/2023     Hyperlipidemia Lab Results  Component Value Date   CHOL 119 01/28/2024   CHOL 117 01/26/2023   CHOL 107 01/22/2022   Lab Results  Component Value Date   HDL 41.60 01/28/2024   HDL 51.80 01/26/2023   HDL 45.50 01/22/2022   Lab Results  Component Value Date   LDLCALC 48 01/28/2024   LDLCALC 45 01/26/2023   LDLCALC 44 01/22/2022   Lab Results  Component Value Date   TRIG 146.0 01/28/2024   TRIG 101.0 01/26/2023   TRIG 89.0 01/22/2022   Lab Results  Component Value Date   CHOLHDL 3 01/28/2024   CHOLHDL 2 01/26/2023   CHOLHDL 2 01/22/2022   Lab Results  Component Value Date   LDLDIRECT 70.0 05/07/2016   LDLDIRECT 85.0 11/02/2015   LDLDIRECT 93.0 03/19/2015   Atorvastatin  5 mg daily  Fenofibrate  54 mg daily    Patient Active Problem List   Diagnosis Date Noted   Controlled type 2 diabetes mellitus without complication, without long-term current use of insulin (HCC) 02/04/2024   Left breast lump 11/30/2023   Vascular calcification 11/30/2023   Renal atrophy 11/30/2023   Abdominal tenderness 11/03/2023   Stress reaction 10/20/2023   Foot pain, bilateral 02/02/2023   Intertrigo 01/29/2022   Hearing loss 01/31/2021   Upper abdominal pain 04/17/2020   Estrogen deficiency 11/03/2017   Osteopenia 11/03/2017   Leg cramps 11/18/2016   Family history of congenital heart disease 11/18/2016   Cough due to ACE inhibitor 11/18/2016   Routine general medical examination at a health care facility 03/26/2015   Colon cancer screening 03/24/2014   Encounter for Medicare annual wellness exam 03/24/2014   Diabetes type 2, controlled (HCC) 03/17/2014   OSA (obstructive sleep apnea) 08/10/2013   Chronic mastitis of left breast 07/04/2013   History of breast cancer 02/10/2012   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   BACK PAIN, CHRONIC 05/10/2007   PEPTIC ULCER DISEASE, HX OF 05/10/2007   HYSTERECTOMY, HX OF 05/10/2007   TONSILLECTOMY, HX OF 05/10/2007   Hypothyroidism 01/28/2007    Hyperlipidemia associated with type 2 diabetes mellitus (HCC) 01/28/2007   COMMON MIGRAINE 01/28/2007   Essential hypertension 01/28/2007   Fatty liver 01/28/2007   CARPAL TUNNEL SYNDROME, HX OF 01/27/2007   Past Medical History:  Diagnosis Date   Allergy    Angioneurotic edema not elsewhere classified    ACE-I   Arthritis    Backache, unspecified    breast  ca dx'd 06/2009   breast cancer left - lumpectomy   Diabetes mellitus without complication (HCC)    Full dentures    Migraine without aura, without mention of intractable migraine without mention of status migrainosus    Obesity, unspecified    Other abnormal glucose    Other and unspecified hyperlipidemia    Other chronic nonalcoholic liver disease    Personal history of peptic ulcer disease    Personal history of radiation therapy    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 Cornett, MD;  Location: [facility];  Service: General;  Laterality: Left;   INCISION AND DRAINAGE OF WOUND Left 12/21/2013   Procedure: DEBRIDEMENT OF LEFT BREAST WOUND WITH PLACEMENT OF A CELL;  Surgeon: Estefana Reichert, DO;  Location: [facility];  Service: Plastics;  Laterality: Left;   LAPAROSCOPY ABDOMEN DIAGNOSTIC     TONSILLECTOMY     TUBAL LIGATION  1971   Social History   Tobacco Use   Smoking status: Never   Smokeless tobacco: Never  Vaping  Use   Vaping status: Never Used  Substance Use Topics   Alcohol use: No    Alcohol/week: 0.0 standard drinks of alcohol   Drug use: No   Family History  Problem Relation Age of Onset   Peripheral vascular disease Mother    Coronary artery disease Mother    Hyperlipidemia Mother    Heart disease Mother        CAD/fatal MI   Diabetes Mother    Stroke Mother    Diabetes Sister    Hypothyroidism Sister    Diabetes Brother    Hyperlipidemia Brother    Heart disease Brother    Hypertrophic cardiomyopathy Brother        congenital   Breast cancer Maternal Grandmother    Breast cancer Daughter    Colon cancer Neg Hx    Esophageal cancer Neg Hx    Pancreatic cancer Neg Hx    Stomach cancer Neg Hx    Rectal cancer Neg Hx    Allergies  Allergen Reactions   Metoprolol     Lips swelled/ hands swelled and her skin turned red    Sulfonamide Derivatives    Current Outpatient Medications on File Prior to Visit  Medication Sig Dispense Refill   Cholecalciferol (VITAMIN D3) 2000 UNITS TABS Take by mouth.     famotidine  (PEPCID ) 20 MG tablet Take 1 tablet (20 mg total) by mouth 2 (two) times daily. 60 tablet 0   ketoconazole  (NIZORAL ) 2 % cream Apply 1 Application topically daily as needed. To rash/affected area 15 g 2   Magnesium 400 MG CAPS Take 1 tablet by mouth 3 (three) times a week.     Multiple Vitamins-Minerals (MULTIVITAMIN,TX-MINERALS) tablet Take 1 tablet by mouth 2 (two) times a week.  Omega-3 Fatty Acids (OMEGA 3 PO) Take 1 capsule by mouth 2 (two) times a week.      vitamin C (ASCORBIC ACID) 500 MG tablet Take 500 mg by mouth daily.     Vitamin E 400 units TABS Take 800 Units by mouth daily.     zinc gluconate 50 MG tablet Take 50 mg by mouth 2 (two) times a week.     No current facility-administered medications on file prior to visit.    Review of Systems  Constitutional:  Negative for activity change, appetite change, fatigue, fever and unexpected weight change.   HENT:  Negative for congestion, ear pain, rhinorrhea, sinus pressure and sore throat.   Eyes:  Negative for pain, redness and visual disturbance.  Respiratory:  Negative for cough, shortness of breath and wheezing.   Cardiovascular:  Negative for chest pain and palpitations.  Gastrointestinal:  Negative for abdominal pain, blood in stool, constipation and diarrhea.       Upper abdominal soreness/tenderness when bending over   Endocrine: Negative for polydipsia and polyuria.  Genitourinary:  Negative for dysuria, frequency and urgency.  Musculoskeletal:  Negative for arthralgias, back pain and myalgias.  Skin:  Negative for pallor and rash.  Allergic/Immunologic: Negative for environmental allergies.  Neurological:  Negative for dizziness, syncope and headaches.       Some tingly pain in feet   Hematological:  Negative for adenopathy. Does not bruise/bleed easily.  Psychiatric/Behavioral:  Negative for decreased concentration and dysphoric mood. The patient is nervous/anxious.        Objective:   Physical Exam Constitutional:      General: She is not in acute distress.    Appearance: Normal appearance. She is well-developed and normal weight. She is not ill-appearing or diaphoretic.  HENT:     Head: Normocephalic and atraumatic.     Right Ear: Tympanic membrane, ear canal and external ear normal.     Left Ear: Tympanic membrane, ear canal and external ear normal.     Nose: Nose normal. No congestion.     Mouth/Throat:     Mouth: Mucous membranes are moist.     Pharynx: Oropharynx is clear. No posterior oropharyngeal erythema.  Eyes:     General: No scleral icterus.    Extraocular Movements: Extraocular movements intact.     Conjunctiva/sclera: Conjunctivae normal.     Pupils: Pupils are equal, round, and reactive to light.  Neck:     Thyroid : No thyromegaly.     Vascular: No carotid bruit or JVD.  Cardiovascular:     Rate and Rhythm: Normal rate and regular rhythm.      Pulses: Normal pulses.     Heart sounds: Normal heart sounds.     No gallop.  Pulmonary:     Effort: Pulmonary effort is normal. No respiratory distress.     Breath sounds: Normal breath sounds. No wheezing.     Comments: Good air exch Chest:     Chest wall: No tenderness.  Abdominal:     General: Bowel sounds are normal. There is no distension or abdominal bruit.     Palpations: Abdomen is soft. There is no mass.     Tenderness: There is no abdominal tenderness.     Hernia: No hernia is present.  Genitourinary:    Comments: Breast exam: No mass, nodules, thickening, tenderness, bulging, retraction, inflamation, nipple discharge or skin changes noted.  No axillary or clavicular LA.    Baseline inverted nipples Baseline surgical change in left  breast   Musculoskeletal:        General: No tenderness. Normal range of motion.     Cervical back: Normal range of motion and neck supple. No rigidity. No muscular tenderness.     Right lower leg: No edema.     Left lower leg: No edema.     Comments: No kyphosis   Lymphadenopathy:     Cervical: No cervical adenopathy.  Skin:    General: Skin is warm and dry.     Coloration: Skin is not pale.     Findings: No erythema or rash.     Comments: Solar lentigines diffusely   Neurological:     Mental Status: She is alert. Mental status is at baseline.     Cranial Nerves: No cranial nerve deficit.     Motor: No abnormal muscle tone.     Coordination: Coordination normal.     Gait: Gait normal.     Deep Tendon Reflexes: Reflexes are normal and symmetric. Reflexes normal.  Psychiatric:        Mood and Affect: Mood normal.        Cognition and Memory: Cognition and memory normal.     Comments: Pleasant  Talkative  Candidly discusses symptoms and stressors             Assessment & Plan:   Problem List Items Addressed This Visit       Cardiovascular and Mediastinum   Essential hypertension   bp in fair control at this time  BP  Readings from Last 1 Encounters:  02/04/24 128/65   No changes needed Most recent labs reviewed  Disc lifstyle change with low sodium diet and exercise  Blood pressure better on 2nd check  Continue losartan  25 mg daily  If blood pressure increases consider change to different ARB Watching renal fxn       Relevant Medications   losartan  (COZAAR ) 25 MG tablet   atorvastatin  (LIPITOR) 10 MG tablet   fenofibrate  54 MG tablet     Digestive   Fatty liver   Recent CT was reassuring   Lab Results  Component Value Date   ALT 11 01/28/2024   AST 16 01/28/2024   ALKPHOS 30 (L) 01/28/2024   BILITOT 0.5 01/28/2024           Endocrine   Hypothyroidism   Hypothyroidism  Pt has no clinical changes No change in energy level/ hair or skin/ edema and no tremor Lab Results  Component Value Date   TSH 1.56 01/28/2024    Continues levothyroxine  112 mcg daily       Relevant Medications   levothyroxine  (SYNTHROID ) 112 MCG tablet   Hyperlipidemia associated with type 2 diabetes mellitus (HCC)   Disc goals for lipids and reasons to control them Rev last labs with pt- LDL good at 48 but HDL down (is starting to exercise more now)  Rev low sat fat diet in detail  LDL of 45 Well controlled  Continues atorvastatin  5 mg daily  Also fenofibrate  54 mg daily       Relevant Medications   metFORMIN  (GLUCOPHAGE ) 500 MG tablet   losartan  (COZAAR ) 25 MG tablet   atorvastatin  (LIPITOR) 10 MG tablet   fenofibrate  54 MG tablet   Diabetes type 2, controlled (HCC)   Lab Results  Component Value Date   HGBA1C 7.4 (H) 01/28/2024   HGBA1C 6.3 (A) 10/20/2023   HGBA1C 5.7 01/26/2023   Worse control  Diet was bad-is back on track  now  Pt wants to increase metformin  to 500 mg bid (will watch renal #s)  Microalb utd  Some tingling in feet/normal exam /will watch  On statin  On arb         Relevant Medications   metFORMIN  (GLUCOPHAGE ) 500 MG tablet   losartan  (COZAAR ) 25 MG tablet    atorvastatin  (LIPITOR) 10 MG tablet   Controlled type 2 diabetes mellitus without complication, without long-term current use of insulin (HCC)   Relevant Medications   metFORMIN  (GLUCOPHAGE ) 500 MG tablet   losartan  (COZAAR ) 25 MG tablet   atorvastatin  (LIPITOR) 10 MG tablet     Nervous and Auditory   Hearing loss   Forgot her hearing aides today        Musculoskeletal and Integument   Osteopenia   Dexa 02/2023 No falls or fractures  Discussed fall prevention, supplements and exercise for bone density          Genitourinary   Renal atrophy   Mild decrease in GFR Continue to monitor  Good fluid intake  Avoids nsaids         Other   Stress reaction   Worse lately with family health stressors Declines treatment  Has stopped emotional eating currently       Routine general medical examination at a health care facility - Primary   Reviewed health habits including diet and exercise and skin cancer prevention Reviewed appropriate screening tests for age  Also reviewed health mt list, fam hx and immunization status , as well as social and family history   See HPI Labs reviewed and ordered Health Maintenance  Topic Date Due   Complete foot exam   02/02/2024   COVID-19 Vaccine (3 - 2025-26 season) 02/19/2026*   Medicare Annual Wellness Visit  06/29/2024   Hemoglobin A1C  07/27/2024   Eye exam for diabetics  08/02/2024   Yearly kidney health urinalysis for diabetes  11/02/2024   Yearly kidney function blood test for diabetes  01/27/2025   Colon Cancer Screening  05/15/2027   DTaP/Tdap/Td vaccine (3 - Td or Tdap) 09/13/2033   Pneumococcal Vaccine for age over 58  Completed   Flu Shot  Completed   DEXA scan (bone density measurement)  Completed   Hepatitis C Screening  Completed   Zoster (Shingles) Vaccine  Completed   HPV Vaccine  Aged Out   Meningitis B Vaccine  Aged Out   Breast Cancer Screening  Discontinued  *Topic was postponed. The date shown is not the  original due date.    Flu shot given  Discussed fall prevention, supplements and exercise for bone density  PHQ/ GAD reviewed-pt declines counseling or treatment for stress reaction currently  Utd derm care       Foot pain, bilateral   Some tingling  May be diabetic nerve changes based on increase A1c Working on better dm control and will re eval in 3 mo      Colon cancer screening   Colonoscopy neg 05/2017       Abdominal tenderness   CXR and CT abd /pelvis reassuring  Pt notes it feels like muscles Worse when bending over   Some rib tenderness bilat lower today   Declines further eval but encouraged strongly to alert us  if symptoms worsen       Other Visit Diagnoses       Long term current use of oral hypoglycemic drug         Need for influenza vaccination  Relevant Orders   Flu vaccine HIGH DOSE PF(Fluzone Trivalent) (Completed)

## 2024-02-04 NOTE — Assessment & Plan Note (Signed)
 Disc goals for lipids and reasons to control them Rev last labs with pt- LDL good at 48 but HDL down (is starting to exercise more now)  Rev low sat fat diet in detail  LDL of 45 Well controlled  Continues atorvastatin  5 mg daily  Also fenofibrate  54 mg daily

## 2024-02-04 NOTE — Assessment & Plan Note (Signed)
 Colonoscopy neg 05/2017

## 2024-02-04 NOTE — Assessment & Plan Note (Signed)
 Some tingling  May be diabetic nerve changes based on increase A1c Working on better dm control and will re eval in 3 mo

## 2024-02-04 NOTE — Assessment & Plan Note (Signed)
 CXR and CT abd /pelvis reassuring  Pt notes it feels like muscles Worse when bending over   Some rib tenderness bilat lower today   Declines further eval but encouraged strongly to alert us  if symptoms worsen

## 2024-02-04 NOTE — Assessment & Plan Note (Signed)
 Recent CT was reassuring   Lab Results  Component Value Date   ALT 11 01/28/2024   AST 16 01/28/2024   ALKPHOS 30 (L) 01/28/2024   BILITOT 0.5 01/28/2024

## 2024-02-04 NOTE — Assessment & Plan Note (Signed)
 Dexa 02/2023 No falls or fractures  Discussed fall prevention, supplements and exercise for bone density

## 2024-02-04 NOTE — Assessment & Plan Note (Signed)
 bp in fair control at this time  BP Readings from Last 1 Encounters:  02/04/24 128/65   No changes needed Most recent labs reviewed  Disc lifstyle change with low sodium diet and exercise  Blood pressure better on 2nd check  Continue losartan  25 mg daily  If blood pressure increases consider change to different ARB Watching renal fxn

## 2024-02-04 NOTE — Assessment & Plan Note (Signed)
 Worse lately with family health stressors Declines treatment  Has stopped emotional eating currently

## 2024-02-04 NOTE — Assessment & Plan Note (Signed)
 Lab Results  Component Value Date   HGBA1C 7.4 (H) 01/28/2024   HGBA1C 6.3 (A) 10/20/2023   HGBA1C 5.7 01/26/2023   Worse control  Diet was bad-is back on track now  Pt wants to increase metformin  to 500 mg bid (will watch renal #s)  Microalb utd  Some tingling in feet/normal exam /will watch  On statin  On arb

## 2024-02-04 NOTE — Assessment & Plan Note (Signed)
 Hypothyroidism  Pt has no clinical changes No change in energy level/ hair or skin/ edema and no tremor Lab Results  Component Value Date   TSH 1.56 01/28/2024    Continues levothyroxine  112 mcg daily

## 2024-05-09 ENCOUNTER — Encounter: Payer: Self-pay | Admitting: Family Medicine

## 2024-05-09 ENCOUNTER — Ambulatory Visit: Admitting: Family Medicine

## 2024-05-09 VITALS — BP 130/70 | HR 78 | Temp 97.9°F | Ht 65.5 in | Wt 169.4 lb

## 2024-05-09 DIAGNOSIS — M79671 Pain in right foot: Secondary | ICD-10-CM | POA: Diagnosis not present

## 2024-05-09 DIAGNOSIS — M79672 Pain in left foot: Secondary | ICD-10-CM

## 2024-05-09 DIAGNOSIS — I10 Essential (primary) hypertension: Secondary | ICD-10-CM | POA: Diagnosis not present

## 2024-05-09 DIAGNOSIS — Z7984 Long term (current) use of oral hypoglycemic drugs: Secondary | ICD-10-CM | POA: Insufficient documentation

## 2024-05-09 DIAGNOSIS — E785 Hyperlipidemia, unspecified: Secondary | ICD-10-CM

## 2024-05-09 DIAGNOSIS — E119 Type 2 diabetes mellitus without complications: Secondary | ICD-10-CM

## 2024-05-09 DIAGNOSIS — E1169 Type 2 diabetes mellitus with other specified complication: Secondary | ICD-10-CM

## 2024-05-09 DIAGNOSIS — R252 Cramp and spasm: Secondary | ICD-10-CM | POA: Diagnosis not present

## 2024-05-09 DIAGNOSIS — E039 Hypothyroidism, unspecified: Secondary | ICD-10-CM

## 2024-05-09 LAB — POCT GLYCOSYLATED HEMOGLOBIN (HGB A1C): Hemoglobin A1C: 6.9 % — AB (ref 4.0–5.6)

## 2024-05-09 NOTE — Progress Notes (Signed)
 "  Subjective:    Patient ID: Carla Mccoy, female    DOB: [birth year], 74 y.o.   MRN: [id number]  HPI  Wt Readings from Last 3 Encounters:  05/09/24 169 lb 6.4 oz (76.8 kg)  02/04/24 169 lb 4 oz (76.8 kg)  11/30/23 170 lb (77.1 kg)   27.76 kg/m  Vitals:   05/09/24 0808 05/09/24 0809  BP: (!) 158/70 130/70  Pulse: 78   Temp: 97.9 F (36.6 C)   SpO2: 98%     Pt presents for follow up of DM 2 and chronic conditions HTN Hyperlipidemia Leg cramps  Problems taking care of her feet/occational discomfort on soles of feet /not numb   Had a bad episode of muscle cramps on thanksgiving (both legs, worse in one)  She had to drink vinegar to control it  Had stood a lot that day   Is good at getting fluids   Takes magnesium   HTN bp is stable today  No cp or palpitations or headaches or edema  No side effects to medicines  BP Readings from Last 3 Encounters:  05/09/24 130/70  02/04/24 128/65  11/30/23 126/72    Losartan  25 mg daily   Has not taken it yet today- takes with breakfast   Last blood pressure  130/70   Lab Results  Component Value Date   NA 140 01/28/2024   K 4.9 01/28/2024   CO2 27 01/28/2024   GLUCOSE 145 (H) 01/28/2024   BUN 20 01/28/2024   CREATININE 1.04 01/28/2024   CALCIUM  9.9 01/28/2024   GFR 52.76 (L) 01/28/2024   GFRNONAA >60 05/31/2009     DM2 Diabetes Home sugar results   DM diet  Diet was off last time  Doing better (on non holiday days)  Drinking water more   Cutting portions    Exercise  Averages 75-10 thousand steps per day  Strength training - using exercise bands     Lab Results  Component Value Date   HGBA1C 6.9 (A) 05/09/2024   HGBA1C 7.4 (H) 01/28/2024   HGBA1C 6.3 (A) 10/20/2023   Lab Results  Component Value Date   MICROALBUR <0.7 11/03/2023    Renal protection-arb Taking statin  Last eye exam 07/2023  Metformin   500 mg bid - tolerating it well  No diarrhea from it     Hyperlipidemia Lab  Results  Component Value Date   CHOL 119 01/28/2024   HDL 41.60 01/28/2024   LDLCALC 48 01/28/2024   LDLDIRECT 70.0 05/07/2016   TRIG 146.0 01/28/2024   CHOLHDL 3 01/28/2024   Atorvastatin  5 mg daily  Fenofibrate  54 mg daily  Fatty liver Lab Results  Component Value Date   ALT 11 01/28/2024   AST 16 01/28/2024   ALKPHOS 30 (L) 01/28/2024   BILITOT 0.5 01/28/2024   Fibrosis 4 Score = 1.09  Fib-4 interpretation is not validated for people under 35 or over 15 years of age. However, scores under 2.0 are generally considered low risk.   Patient Active Problem List   Diagnosis Date Noted   Long term current use of oral hypoglycemic drug 05/09/2024   Controlled type 2 diabetes mellitus without complication, without long-term current use of insulin (HCC) 02/04/2024   Left breast lump 11/30/2023   Vascular calcification 11/30/2023   Renal atrophy 11/30/2023   Abdominal tenderness 11/03/2023   Stress reaction 10/20/2023   Foot pain, bilateral 02/02/2023   Intertrigo 01/29/2022   Hearing loss 01/31/2021   Upper abdominal pain 04/17/2020  Estrogen deficiency 11/03/2017   Osteopenia 11/03/2017   Leg cramps 11/18/2016   Family history of congenital heart disease 11/18/2016   Cough due to ACE inhibitor 11/18/2016   Routine general medical examination at a health care facility 03/26/2015   Colon cancer screening 03/24/2014   Encounter for Medicare annual wellness exam 03/24/2014   OSA (obstructive sleep apnea) 08/10/2013   Chronic mastitis of left breast 07/04/2013   History of breast cancer 02/10/2012   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   BACK PAIN, CHRONIC 05/10/2007   PEPTIC ULCER DISEASE, HX OF 05/10/2007   HYSTERECTOMY, HX OF 05/10/2007   TONSILLECTOMY, HX OF 05/10/2007   Hypothyroidism 01/28/2007   Hyperlipidemia associated with type 2 diabetes mellitus (HCC) 01/28/2007   COMMON MIGRAINE 01/28/2007   Essential hypertension 01/28/2007   Fatty liver 01/28/2007   CARPAL  TUNNEL SYNDROME, HX OF 01/27/2007   Past Medical History:  Diagnosis Date   Allergy    Angioneurotic edema not elsewhere classified    ACE-I   Arthritis    Backache, unspecified    breast ca dx'd 06/2009   breast cancer left - lumpectomy   Diabetes mellitus without complication (HCC)    Full dentures    Migraine without aura, without mention of intractable migraine without mention of status migrainosus    Obesity, unspecified    Other abnormal glucose    Other and unspecified hyperlipidemia    Other chronic nonalcoholic liver disease    Personal history of peptic ulcer disease    Personal history of radiation therapy    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 Cornett, MD;  Location: [facility];  Service: General;  Laterality: Left;   INCISION AND DRAINAGE OF WOUND Left 12/21/2013   Procedure: DEBRIDEMENT OF LEFT BREAST WOUND WITH PLACEMENT OF A CELL;  Surgeon: Estefana Reichert, DO;  Location: [facility];  Service: Plastics;  Laterality: Left;   LAPAROSCOPY ABDOMEN DIAGNOSTIC     TONSILLECTOMY     TUBAL LIGATION  1971   Social History[1] Family History  Problem Relation Age of Onset   Peripheral vascular disease Mother    Coronary artery disease Mother    Hyperlipidemia Mother    Heart disease Mother        CAD/fatal MI   Diabetes Mother    Stroke Mother    Diabetes Sister     Hypothyroidism Sister    Diabetes Brother    Hyperlipidemia Brother    Heart disease Brother    Hypertrophic cardiomyopathy Brother        congenital   Breast cancer Maternal Grandmother    Breast cancer Daughter    Colon cancer Neg Hx    Esophageal cancer Neg Hx    Pancreatic cancer Neg Hx    Stomach cancer Neg Hx    Rectal cancer  Neg Hx    Allergies[2] Medications Ordered Prior to Encounter[3]  Review of Systems  Constitutional:  Negative for activity change, appetite change, fatigue, fever and unexpected weight change.  HENT:  Negative for congestion, ear pain, rhinorrhea, sinus pressure and sore throat.   Eyes:  Negative for pain, redness and visual disturbance.  Respiratory:  Negative for cough, shortness of breath and wheezing.   Cardiovascular:  Negative for chest pain and palpitations.  Gastrointestinal:  Negative for abdominal pain, blood in stool, constipation and diarrhea.  Endocrine: Negative for polydipsia and polyuria.  Genitourinary:  Negative for dysuria, frequency and urgency.  Musculoskeletal:  Negative for arthralgias, back pain and myalgias.       Intermittent leg cramps  Skin:  Negative for pallor and rash.  Allergic/Immunologic: Negative for environmental allergies.  Neurological:  Negative for dizziness, syncope and headaches.       Bottom of feet hurt at times   Hematological:  Negative for adenopathy. Does not bruise/bleed easily.  Psychiatric/Behavioral:  Negative for decreased concentration and dysphoric mood. The patient is not nervous/anxious.        Objective:   Physical Exam Constitutional:      General: She is not in acute distress.    Appearance: Normal appearance. She is well-developed and normal weight. She is not ill-appearing or diaphoretic.  HENT:     Head: Normocephalic and atraumatic.  Eyes:     Conjunctiva/sclera: Conjunctivae normal.     Pupils: Pupils are equal, round, and reactive to light.  Neck:     Thyroid : No thyromegaly.      Vascular: No carotid bruit or JVD.  Cardiovascular:     Rate and Rhythm: Normal rate and regular rhythm.     Pulses: Normal pulses.     Heart sounds: Normal heart sounds.     No gallop.  Pulmonary:     Effort: Pulmonary effort is normal. No respiratory distress.     Breath sounds: Normal breath sounds. No wheezing or rales.  Abdominal:     General: There is no distension or abdominal bruit.     Palpations: Abdomen is soft.  Musculoskeletal:     Cervical back: Normal range of motion and neck supple.     Right lower leg: No edema.     Left lower leg: No edema.  Lymphadenopathy:     Cervical: No cervical adenopathy.  Skin:    General: Skin is warm and dry.     Coloration: Skin is not pale.     Findings: No rash.  Neurological:     Mental Status: She is alert.     Sensory: No sensory deficit.     Coordination: Coordination normal.     Deep Tendon Reflexes: Reflexes are normal and symmetric. Reflexes normal.  Psychiatric:        Mood and Affect: Mood normal.           Assessment & Plan:   Problem List Items Addressed This Visit       Cardiovascular and Mediastinum   Essential hypertension   Did not take blood pressure medicine yet today but blood pressure at home has been good Better on 2nd check  bp in fair control at this time  BP Readings from Last 1 Encounters:  05/09/24 130/70   No changes needed Most recent labs reviewed  Disc lifstyle change with low sodium diet and exercise   Continues losartan  25 mg daily   Follow up 3 mo with lab prior  Endocrine   Hyperlipidemia associated with type 2 diabetes mellitus (HCC)   Disc goals for lipids and reasons to control them Rev last labs with pt- LDL good at 48 but HDL down (is starting to exercise more now)  Rev low sat fat diet in detail  LDL of 45 Well controlled  Continues atorvastatin  5 mg daily  Also fenofibrate  54 mg daily   Follow up 3 mo lab prior       Controlled type 2 diabetes  mellitus without complication, without long-term current use of insulin (HCC) - Primary   Lab Results  Component Value Date   HGBA1C 6.9 (A) 05/09/2024   HGBA1C 7.4 (H) 01/28/2024   HGBA1C 6.3 (A) 10/20/2023   Lab Results  Component Value Date   MICROALBUR <0.7 11/03/2023     Improvement with metformin  500 mg bid  Also better diet  Encouraged to step up exercise (bands/video) and protein intake   Follow up 3 mo with lab prior  Normal foot exam today Did refer to podiatry for help with nails and diabetic foot care       Relevant Orders   POCT HgB A1C (Completed)   Ambulatory referral to Podiatry     Other   Long term current use of oral hypoglycemic drug   Metformin  500 mg bid Lab Results  Component Value Date   HGBA1C 6.9 (A) 05/09/2024         Leg cramps   Chronic  Continues hydration, magnesium Had flare after standing 15 hours at holidays    Reassuring exam  Encouraged to get back to her exercise video that may have helped in the past      Foot pain, bilateral   Normal sensation to soft touch   Does c/o some discomfort on bottom of feet Will work to control glucose  Ref to podiatry         [1]  Social History Tobacco Use   Smoking status: Never   Smokeless tobacco: Never  Vaping Use   Vaping status: Never Used  Substance Use Topics   Alcohol use: No    Alcohol/week: 0.0 standard drinks of alcohol   Drug use: No  [2]  Allergies Allergen Reactions   Metoprolol     Lips swelled/ hands swelled and her skin turned red    Sulfonamide Derivatives   [3]  Current Outpatient Medications on File Prior to Visit  Medication Sig Dispense Refill   atorvastatin  (LIPITOR) 10 MG tablet Take 1/2 (one-half) tablet by mouth once daily 45 tablet 3   Cholecalciferol (VITAMIN D3) 2000 UNITS TABS Take by mouth.     famotidine  (PEPCID ) 20 MG tablet Take 1 tablet (20 mg total) by mouth 2 (two) times daily. 60 tablet 0   fenofibrate  54 MG tablet Take 1 tablet  (54 mg total) by mouth daily. 90 tablet 3   ketoconazole  (NIZORAL ) 2 % cream Apply 1 Application topically daily as needed. To rash/affected area 15 g 2   levothyroxine  (SYNTHROID ) 112 MCG tablet Take 1 tablet (112 mcg total) by mouth daily. 90 tablet 3   losartan  (COZAAR ) 25 MG tablet Take 1 tablet (25 mg total) by mouth daily. 90 tablet 3   Magnesium 400 MG CAPS Take 1 tablet by mouth 3 (three) times a week.     metFORMIN  (GLUCOPHAGE ) 500 MG tablet Take 1 tablet (500 mg total) by mouth 2 (two) times daily with a meal. 180 tablet 3   Multiple Vitamins-Minerals (MULTIVITAMIN,TX-MINERALS) tablet Take 1  tablet by mouth 2 (two) times a week.     Omega-3 Fatty Acids (OMEGA 3 PO) Take 1 capsule by mouth 2 (two) times a week.      vitamin C (ASCORBIC ACID) 500 MG tablet Take 500 mg by mouth daily.     Vitamin E 400 units TABS Take 800 Units by mouth daily.     zinc gluconate 50 MG tablet Take 50 mg by mouth 2 (two) times a week.     No current facility-administered medications on file prior to visit.   "

## 2024-05-09 NOTE — Assessment & Plan Note (Signed)
 Normal sensation to soft touch   Does c/o some discomfort on bottom of feet Will work to control glucose  Ref to podiatry

## 2024-05-09 NOTE — Assessment & Plan Note (Signed)
 Disc goals for lipids and reasons to control them Rev last labs with pt- LDL good at 48 but HDL down (is starting to exercise more now)  Rev low sat fat diet in detail  LDL of 45 Well controlled  Continues atorvastatin  5 mg daily  Also fenofibrate  54 mg daily   Follow up 3 mo lab prior

## 2024-05-09 NOTE — Assessment & Plan Note (Signed)
 Did not take blood pressure medicine yet today but blood pressure at home has been good Better on 2nd check  bp in fair control at this time  BP Readings from Last 1 Encounters:  05/09/24 130/70   No changes needed Most recent labs reviewed  Disc lifstyle change with low sodium diet and exercise   Continues losartan  25 mg daily   Follow up 3 mo with lab prior

## 2024-05-09 NOTE — Patient Instructions (Addendum)
 Keep up the good   Avoid added sugars in your diet when you can  Try to get most of your carbohydrates from produce (with the exception of white potatoes) and whole grains Eat less bread/pasta/rice/snack foods/cereals/sweets and other items from the middle of the grocery store (processed carbs)  Keep up your water intake   Keep up the exercise  Especially strength training  Stretch at the end of your workout    Follow up in 3 months with fasting labs prior

## 2024-05-09 NOTE — Assessment & Plan Note (Addendum)
 Lab Results  Component Value Date   HGBA1C 6.9 (A) 05/09/2024   HGBA1C 7.4 (H) 01/28/2024   HGBA1C 6.3 (A) 10/20/2023   Lab Results  Component Value Date   MICROALBUR <0.7 11/03/2023     Improvement with metformin  500 mg bid  Also better diet  Encouraged to step up exercise (bands/video) and protein intake   Follow up 3 mo with lab prior  Normal foot exam today Did refer to podiatry for help with nails and diabetic foot care

## 2024-05-09 NOTE — Assessment & Plan Note (Signed)
 Metformin  500 mg bid Lab Results  Component Value Date   HGBA1C 6.9 (A) 05/09/2024

## 2024-05-09 NOTE — Assessment & Plan Note (Signed)
 Chronic  Continues hydration, magnesium Had flare after standing 15 hours at holidays    Reassuring exam  Encouraged to get back to her exercise video that may have helped in the past

## 2024-06-30 ENCOUNTER — Ambulatory Visit: Payer: PPO

## 2024-07-08 ENCOUNTER — Ambulatory Visit

## 2024-08-01 ENCOUNTER — Other Ambulatory Visit

## 2024-08-08 ENCOUNTER — Ambulatory Visit: Admitting: Family Medicine
